# Patient Record
Sex: Female | Born: 1935
Health system: Southern US, Community
[De-identification: ages and names within clinical notes are randomized; demographics above are authoritative.]

## PROBLEM LIST (undated history)

## (undated) DIAGNOSIS — R001 Bradycardia, unspecified: Secondary | ICD-10-CM

## (undated) DIAGNOSIS — I480 Paroxysmal atrial fibrillation: Secondary | ICD-10-CM

## (undated) DIAGNOSIS — I10 Essential (primary) hypertension: Secondary | ICD-10-CM

## (undated) DIAGNOSIS — M341 CR(E)ST syndrome: Secondary | ICD-10-CM

## (undated) DIAGNOSIS — H269 Unspecified cataract: Secondary | ICD-10-CM

## (undated) DIAGNOSIS — R110 Nausea: Secondary | ICD-10-CM

## (undated) DIAGNOSIS — I272 Pulmonary hypertension, unspecified: Secondary | ICD-10-CM

## (undated) HISTORY — DX: Bradycardia, unspecified: R00.1

## (undated) HISTORY — PX: EYE SURGERY: SHX253

## (undated) HISTORY — DX: Unspecified cataract: H26.9

## (undated) HISTORY — PX: ABDOMINAL HYSTERECTOMY: SHX81

## (undated) HISTORY — DX: Paroxysmal atrial fibrillation: I48.0

---

## 2002-02-12 ENCOUNTER — Ambulatory Visit (HOSPITAL_COMMUNITY): Admission: RE | Admit: 2002-02-12 | Discharge: 2002-02-12 | Payer: Self-pay | Admitting: Unknown Physician Specialty

## 2002-07-15 ENCOUNTER — Ambulatory Visit (HOSPITAL_COMMUNITY): Admission: RE | Admit: 2002-07-15 | Discharge: 2002-07-15 | Payer: Self-pay | Admitting: *Deleted

## 2007-07-26 ENCOUNTER — Ambulatory Visit: Payer: Self-pay | Admitting: Family Medicine

## 2007-07-26 ENCOUNTER — Inpatient Hospital Stay (HOSPITAL_COMMUNITY): Admission: AD | Admit: 2007-07-26 | Discharge: 2007-07-29 | Payer: Self-pay | Admitting: Family Medicine

## 2011-02-26 NOTE — Discharge Summary (Signed)
Crystal, Espinoza NO.:  [id number]   MEDICAL RECORD NO.:  [id number]          PATIENT TYPE:  INP   LOCATION:  5710                         FACILITY:  [facility]   PHYSICIAN:  Eustaquio Boyden, MD   DATE OF BIRTH:  [birth year]   DATE OF ADMISSION:  07/26/2007  DATE OF DISCHARGE:  07/29/2007                               DISCHARGE SUMMARY   ATTENDING PHYSICIAN:  Leighton Roach McDiarmid, M.D.   DISCHARGE DIAGNOSES:  1. Pneumonia, right lower lobe.  2. Hypothyroidism.  3. Dyslipidemia.  4. Weakness.   CONSULTATIONS:  None.   PROCEDURE:  None.   ADMISSION LABORATORY DATA:  Sodium 130, potassium 3.4, chloride 101,  bicarb 22, BUN 6, creatinine 0.65, glucose 150, calcium 8.5.  White  blood cell 15.3 with 94% neutrophils.  Hemoglobin 12.8, hematocrit 37.6,  platelets 139.  BNP 172.   DISCHARGE LABORATORY DATA:  Sodium 136, potassium 4.5, chloride 105,  bicarb 23, BUN 5, creatinine 0.76, glucose 126, calcium 8.8.  White  blood cell 11.5, hemoglobin 10.6, hematocrit 31.4, platelets 135.  TSH  1.591.  Blood cultures:  No growth to date times two.   BRIEF HISTORY AND PHYSICAL:  For full summary, please see dictated H&P.  In brief, this is a 77 year old female with a history of hypothyroidism,  dyslipidemia who presents with a one-week history of cough, weakness and  fever and found to have, on outside read of chest x-ray, a right lower  and middle lobe pneumonia.   1. Right lower lobe pneumonia.  This was found on outside read of      chest x-ray done at [facility].  The patient admitted with      a white blood cell count of 15.3 and subjective fevers at home but      afebrile during hospitalization.  The patient initially required      oxygen supplementation to maintain good O2 saturations at 2 liters      nasal cannula.  This was slowly weaned off and the patient      tolerated well, maintaining 94- to 96% on room air upon discharge.      The patient treated  for pneumonia with azithromycin and Rocephin      for 4 days and discharged home to complete a five-day course of      azithromycin p.o.  2. Hypothyroidism.  The patient's TSH was measured during      hospitalization and was found to be 1.591.  The patient was      continued on home dose of Synthroid 88 mcg q.d.  3. Dyslipidemia.  The patient presented taking at home a dose of an      unknown amount of Lipitor but stated that she broke each pill into      4 pieces and took 1/4 of a pill every day.  The patient was      maintained on Zocor during hospitalization and discharged home on      her previous Lipitor regimen.  4. Weakness.  The patient presented very weak.  Physical therapy was  consulted and worked with the patient to regain strength.  On the      day of discharge, the patient noted that she had much improved      strength and was able to walk around.  Physical therapy believed      that the patient would do well at home without any further      assistance.  The patient was also treated with IV fluids to ensure      that she maintained adequate hydration.  This was slowly      transitioned to p.o. diet which she tolerated well.   DISCHARGE MEDICATIONS:  1. Synthroid 88 mcg one p.o. q.d.  2. Lipitor to continue home dose.  3. Aspirin 81 mg p.o. q.d.  4. Azithromycin 500 mg one p.o. q.d. for three more days.  5. Dextromethorphan for cough 60 mg twice daily.  6. Zolpidem 5 mg once nightly as needed for insomnia.  The patient is      given a total of 7 pills to last her until she feels better.   FOLLOWUP:  The patient is instructed to call Dr. Renne Crigler, her primary care  physician, for an appointment for follow up within the next several  weeks.  If the patient is unable to make an appointment, she should  follow up with [facility] with Dr. Milus Glazier.   ISSUES FOR FOLLOW UP:  1. Blood cultures times two.  2. Resolution of pneumonia without any more fevers or  weakness.  The      patient told that the cough would probably stay with her for a      couple of more weeks before she noted complete resolution of      symptoms.      Eustaquio Boyden, MD  Electronically Signed     JG/MEDQ  D:  07/29/2007  T:  07/30/2007  Job:  [id number]   cc:   Soyla Murphy. Renne Crigler, M.D.

## 2011-02-26 NOTE — H&P (Signed)
Crystal, Espinoza NO.:  [id number]   MEDICAL RECORD NO.:  [id number]          PATIENT TYPE:  INP   LOCATION:  5710                         FACILITY:  [facility]   PHYSICIAN:  Crystal Espinoza, M.D.DATE OF BIRTH:  [birth year]   DATE OF ADMISSION:  07/26/2007  DATE OF DISCHARGE:                              HISTORY & PHYSICAL   CHIEF COMPLAINT:  One-week history of cold and sore throat.   PRIMARY CARE Crystal Espinoza:  Dr. Renne Crigler at [facility] on [address]   HISTORY OF PRESENT ILLNESS:  This is a pleasant 77 year old female with  history of hypothyroidism who presents from [facility] with  history of one week feeling sick.  Patient states this started about a  week ago when she started feeling sick with a sore throat and this  progressed to a cough that was productive of yellow and brown sputum.  Patient went on Wednesday to [facility] to be seen and was prescribed  Singulair along with a nasal spray, ProAir, Nasacort and Veramyst.  Patient says this did not help.  Throughout the next couple of days,  patient's status worsened.  She had decreased sleeping, decreased intake  by p.o. and nausea, but no vomiting.  Patient states she has an  appetite, but just has been eating less in the last several days.  Patient also has had subjective fevers in the last couple days,  specifically this a.m.  Patient's feeling of general malaise progressed  until today when she and her husband went to [facility] to be  evaluated again.  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  She has had people who have been coughing  and she thinks she might have caught something from them.  At [facility]  today, she had a white blood cell of 13, a chest x-ray was read as a  right middle lobe infiltrate, strep test that was negative and  neutrophils 95%, and a glucose of 151 and a urinalysis which was  negative.   REVIEW OF SYSTEMS:  No  dysuria, no chest pain, abdominal pain, no  diarrhea or constipation, no blood in stool or urine, no changes in  bowel movements, +myalgia, but no vomiting.  Please see HPI, otherwise  negative.   PAST MEDICAL HISTORY:  1. Hypothyroidism.  2. History of skin cancer removed on face and hands.  3. Breast lumpectomies.  4. Two back surgeries.  5. One knee surgery, remote.   FAMILY HISTORY:  Diabetes in two brothers, skin cancer in one brother  and a brother who died of a stroke at the age of 103.   SOCIAL HISTORY:  Lives with husband, has two sons and three  grandchildren, one 61, one 33 and one 79.  Denies tobacco, says she  socially drinks alcohol and denies recreational drugs.  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   MEDICATIONS:  1. Levothyroxine 88 mcg q.daily.  2. Lipitor, unknown amount, but she states she cuts in four pieces.  3. Aspirin 81 mg one p.o. q.daily.  4. Glucosamine.   ALLERGIES:  No known drug allergies.   PHYSICAL EXAMINATION:  VITALS AT [place]:  Temperature 99.5, heart rate  106, respiratory rate 24, blood pressure 142/74, room air sating 94%,  dropped to 90 so was placed on two liters.  VITALS AT [place]:  Temperature 99.8, heart rate 91, respiratory rate  20, blood pressure 125/70, O2 sats 97% on two liters nasal cannula.  GENERAL:  Well-developed well-nourished female lying in bed in some  apparent discomfort.  HEENT:  Normocephalic, atraumatic.  Pupils equally round and reactive to  light.  Extraocular movements intact.  Dry mucous membranes.  Pale  conjunctivae.  No pharyngeal erythema or edema noted.  No scleral  icterus noted.  NECK:  Supple.  No lymphadenopathy.  CVS:  Normal S1, S2.  No murmurs, rubs or gallops.  PULMONARY:  Crackles and rales bibasilarly, right greater than left  lower lobes, but normal work of breathing.  No wheezing.  ABDOMEN:  Soft, nontender, nondistended.  Normoactive bowel sounds.  No  guarding,  rebound.  No hepatosplenomegaly.  EXTREMITIES:  No clubbing, cyanosis or edema.  2+ peripheral pulses.  Minimally delayed cap refill.  SKIN:  No rash or jaundice noted.  NEUROLOGICAL:  Cranial nerves II-XII grossly intact.  Sensation intact.  Motor intact.   LABS AND IMAGING:  At [place], white blood cells 13, neutrophils 94% so  left shift.  Other labs are pending.   Chest x-ray at [facility] read as right middle lobe infiltrate.   ASSESSMENT/PLAN:  This is a 77 year old female with history of  hypothyroidism who presents with a one-week history of weakness, sore  throat, productive cough and subjective fever.   1. Cough.  Likely pneumonia versus viral upper respiratory tract      infection; read on outside chest x-ray as a right middle lobe      infiltrate so we have started azithromycin and Rocephin.  We also      will check a strep pneumo and legionella urine antigen.  Continue      IV fluids and monitor with vitals for fever.  Recheck chest x-ray      if not clinically improving.  Blood cultures x2 pending.  2. Fever.  Subjective only.  Max measured is 99.8.  Continue to      monitor with vital signs.  May be due to upper respiratory tract      infection versus pneumonia.  3. Weakness.  Likely multifactorial, possible picture of dehydration.      Replete IV fluids and start on clear liquid diet as tolerated.  4. Fluids, electrolytes, nutrition/gastrointestinal.  See if patient      tolerates p.o. with clears and advance diet as tolerated.  IV fluid      bolus x1 in EMS.  Will do another IV fluid bolus of one liter and      then continue with maintenance IV fluids and D5 half-normal saline      plus 20 potassium at 100 ml per hour.  5. Hypothyroidism.  Continue Synthroid.  Will check TSH.  6. Prophylaxis.  Early ambulation for deep vein thrombosis      prophylaxis.  7. Disposition.  Await clinical improvement, await cultures and await      urine antigen tests.  Will change to p.o.  antibiotics once improved      and as tolerated.      Eustaquio Boyden, MD  Electronically Signed      Crystal Espinoza, M.D.  Electronically Signed    JG/MEDQ  D:  07/26/2007  T:  07/26/2007  Job:  [id number]

## 2011-03-01 NOTE — Op Note (Signed)
   NAME:  Crystal Espinoza, Crystal Espinoza                       ACCOUNT NO.:  [id number]   MEDICAL RECORD NO.:  [id number]                   PATIENT TYPE:  AMB   LOCATION:  ENDO                                 FACILITY:  [facility]   PHYSICIAN:  Georgiana Spinner, M.D.                 DATE OF BIRTH:  18-Aug-1929   DATE OF PROCEDURE:  DATE OF DISCHARGE:                                 OPERATIVE REPORT   PROCEDURE:  Colonoscopy.   INDICATIONS:  Rectal bleeding.   ANESTHESIA:  Demerol 60, Versed 6 mg.   DESCRIPTION OF PROCEDURE:  With the patient mildly sedated in the left  lateral decubitus position, the Olympus videoscope colonoscope was inserted  in the rectum and passed under direct vision through a tortuous sigmoid  colon to the cecum, identified by ileocecal valve and appendiceal orifice,  both of which were photographed.  From this point, the colonoscope was  slowly withdrawn, taking circumferential views of the entire colonic mucosa,  stopping only then in the rectum, which appeared normal on direct and showed  hemorrhoids on the retroflexed view.  The endoscope was straightened and  withdrawn.  The patient's vital signs and pulse oximetry remained stable.  The patient tolerated the procedure well without apparent complications.   FINDINGS:  Internal hemorrhoids, diverticulosis of sigmoid colon, and  tortuosity of the sigmoid colon.  Otherwise, unremarkable exam.   PLAN:  Have patient follow up with me as needed or in 5 years.                                               Georgiana Spinner, M.D.    GMO/MEDQ  D:  07/15/2002  T:  07/15/2002  Job:  [id number]

## 2011-07-25 LAB — CBC
MCHC: 34.1
MCHC: 34.1
MCV: 95.3
MCV: 95.3
MCV: 95.5
Platelets: 131 — ABNORMAL LOW
Platelets: 135 — ABNORMAL LOW
Platelets: 139 — ABNORMAL LOW
RBC: 3.37 — ABNORMAL LOW
RDW: 14.5 — ABNORMAL HIGH
WBC: 11.5 — ABNORMAL HIGH
WBC: 15.3 — ABNORMAL HIGH

## 2011-07-25 LAB — TSH: TSH: 1.591

## 2011-07-25 LAB — CULTURE, BLOOD (ROUTINE X 2)

## 2011-07-25 LAB — DIFFERENTIAL
Basophils Absolute: 0
Basophils Relative: 0
Eosinophils Absolute: 0
Neutro Abs: 14.4 — ABNORMAL HIGH
Neutrophils Relative %: 94 — ABNORMAL HIGH

## 2011-07-25 LAB — BASIC METABOLIC PANEL
BUN: 5 — ABNORMAL LOW
BUN: 6
BUN: 6
CO2: 22
CO2: 26
Calcium: 8.5
Chloride: 101
Chloride: 101
Chloride: 105
Creatinine, Ser: 0.65
Creatinine, Ser: 0.76
Creatinine, Ser: 0.78
Glucose, Bld: 113 — ABNORMAL HIGH

## 2011-07-25 LAB — B-NATRIURETIC PEPTIDE (CONVERTED LAB): Pro B Natriuretic peptide (BNP): 172 — ABNORMAL HIGH

## 2011-09-27 ENCOUNTER — Other Ambulatory Visit (HOSPITAL_COMMUNITY): Payer: Self-pay | Admitting: Rheumatology

## 2011-09-27 DIAGNOSIS — R0602 Shortness of breath: Secondary | ICD-10-CM

## 2011-10-09 ENCOUNTER — Ambulatory Visit (HOSPITAL_COMMUNITY): Payer: Medicare Other

## 2011-10-09 ENCOUNTER — Ambulatory Visit (HOSPITAL_COMMUNITY)
Admission: RE | Admit: 2011-10-09 | Discharge: 2011-10-09 | Disposition: A | Discharge status: Home / Self Care | Payer: Medicare Other | Source: Ambulatory Visit | Attending: Rheumatology | Admitting: Rheumatology

## 2011-10-09 DIAGNOSIS — R0989 Other specified symptoms and signs involving the circulatory and respiratory systems: Secondary | ICD-10-CM | POA: Insufficient documentation

## 2011-10-09 DIAGNOSIS — R0602 Shortness of breath: Secondary | ICD-10-CM | POA: Insufficient documentation

## 2011-10-09 DIAGNOSIS — R0609 Other forms of dyspnea: Secondary | ICD-10-CM | POA: Insufficient documentation

## 2011-10-11 ENCOUNTER — Other Ambulatory Visit (HOSPITAL_COMMUNITY): Payer: Self-pay | Admitting: Radiology

## 2011-10-11 DIAGNOSIS — R0600 Dyspnea, unspecified: Secondary | ICD-10-CM

## 2011-10-14 ENCOUNTER — Ambulatory Visit (HOSPITAL_COMMUNITY): Payer: Medicare Other | Attending: Cardiology | Admitting: Radiology

## 2011-10-14 DIAGNOSIS — R0609 Other forms of dyspnea: Secondary | ICD-10-CM | POA: Insufficient documentation

## 2011-10-14 DIAGNOSIS — I10 Essential (primary) hypertension: Secondary | ICD-10-CM | POA: Insufficient documentation

## 2011-10-14 DIAGNOSIS — R0989 Other specified symptoms and signs involving the circulatory and respiratory systems: Secondary | ICD-10-CM | POA: Insufficient documentation

## 2011-10-14 DIAGNOSIS — R0600 Dyspnea, unspecified: Secondary | ICD-10-CM

## 2011-10-16 ENCOUNTER — Encounter (HOSPITAL_COMMUNITY): Payer: Self-pay | Admitting: Emergency Medicine

## 2012-10-29 ENCOUNTER — Ambulatory Visit (INDEPENDENT_AMBULATORY_CARE_PROVIDER_SITE_OTHER): Payer: Medicare Other | Admitting: Family Medicine

## 2012-10-29 ENCOUNTER — Encounter: Payer: Self-pay | Admitting: Family Medicine

## 2012-10-29 ENCOUNTER — Ambulatory Visit: Payer: Medicare Other

## 2012-10-29 VITALS — BP 162/69 | HR 81 | Temp 98.4°F | Resp 16 | Ht 64.5 in | Wt 147.6 lb

## 2012-10-29 DIAGNOSIS — R059 Cough, unspecified: Secondary | ICD-10-CM

## 2012-10-29 DIAGNOSIS — J069 Acute upper respiratory infection, unspecified: Secondary | ICD-10-CM

## 2012-10-29 DIAGNOSIS — R05 Cough: Secondary | ICD-10-CM

## 2012-10-29 LAB — POCT CBC
Granulocyte percent: 78.4 % (ref 37–80)
HCT, POC: 40.7 % (ref 37.7–47.9)
Hemoglobin: 12.7 g/dL (ref 12.2–16.2)
Lymph, poc: 1.5 (ref 0.6–3.4)
MCH, POC: 31 pg (ref 27–31.2)
MCHC: 31.2 g/dL — AB (ref 31.8–35.4)
MCV: 99.3 fL — AB (ref 80–97)
MID (cbc): 0.3 (ref 0–0.9)
MPV: 11.1 fL (ref 0–99.8)
POC Granulocyte: 6.7 (ref 2–6.9)
POC LYMPH PERCENT: 17.7 % (ref 10–50)
POC MID %: 3.9 % (ref 0–12)
Platelet Count, POC: 151 10*3/uL (ref 142–424)
RBC: 4.1 M/uL (ref 4.04–5.48)
RDW, POC: 15.7 %
WBC: 8.5 10*3/uL (ref 4.6–10.2)

## 2012-10-29 MED ORDER — HYDROCODONE-HOMATROPINE 5-1.5 MG/5ML PO SYRP: 5.0000 mL | ORAL_SOLUTION | Freq: Every evening | ORAL | Status: DC | PRN

## 2012-10-29 MED ORDER — BENZONATATE 100 MG PO CAPS: 100.0000 mg | ORAL_CAPSULE | Freq: Three times a day (TID) | ORAL | Status: DC | PRN

## 2012-10-29 MED ORDER — LEVOFLOXACIN 500 MG PO TABS: 500.0000 mg | ORAL_TABLET | Freq: Every day | ORAL | Status: DC

## 2012-10-29 NOTE — Progress Notes (Signed)
[facility]:  Office Visit  Chief Complaint:  Chief Complaint  Patient presents with  . Cough    x 1 week dry   . Sinus Problem    congestion    HPI: Crystal Espinoza is a 79 y.o. female who complains of cough and congestion, productive yellow sputum x 1 week. + sinus congestion. Tried rx cough syrup she had  with some relief. Had some chills this AM. No ear pain. Non smoker. Has felt worse in the last 2 days, more coughing, could not sleep last night due to cough. She denies msk pains/aches. Has had flu vaccine. Denies fever. No sick contacts.    Past Medical History  Diagnosis Date  . Cataract    Past Surgical History  Procedure Date  . Eye surgery   . Abdominal hysterectomy    History   Social History  . Marital Status: Married    Spouse Name: N/A    Number of Children: N/A  . Years of Education: N/A   Social History Main Topics  . Smoking status: Never Smoker   . Smokeless tobacco: None  . Alcohol Use: No  . Drug Use: No  . Sexually Active: No   Other Topics Concern  . None   Social History Narrative  . None   No family history on file. Allergies no known allergies Prior to Admission medications   Not on File     ROS: The patient denies night sweats, unintentional weight loss, chest pain, palpitations, wheezing, dyspnea on exertion, nausea, vomiting, abdominal pain, dysuria, hematuria, melena, numbness, weakness, or tingling.  All other systems have been reviewed and were otherwise negative with the exception of those mentioned in the HPI and as above.    PHYSICAL EXAM: Filed Vitals:   10/29/12 1656  BP: 162/69  Pulse: 81  Temp: 98.4 F (36.9 C)  Resp: 16   Filed Vitals:   10/29/12 1656  Height: 5' 4.5" (1.638 m)  Weight: 147 lb 9.6 oz (66.951 kg)   Body mass index is 24.94 kg/(m^2).  General: Alert, no acute distress, thin white female HEENT:  Normocephalic, atraumatic, oropharynx patent. + sinus tenderness bl  maxillary sinus, Tm nl . No exudates Cardiovascular:  Regular rate and rhythm, no rubs murmurs or gallops.  No Carotid bruits, radial pulse intact. No pedal edema.  Respiratory: Clear to auscultation bilaterally.  No wheezes, rales, or rhonchi.  No cyanosis, no use of accessory musculature GI: No organomegaly, abdomen is soft and non-tender, positive bowel sounds.  No masses. Skin: No rashes. Neurologic: Facial musculature symmetric. Psychiatric: Patient is appropriate throughout our interaction. Lymphatic: No cervical lymphadenopathy Musculoskeletal: Gait intact.   LABS: Results for orders placed in visit on 10/29/12  POCT CBC      Component Value Range   WBC 8.5  4.6 - 10.2 K/uL   Lymph, poc 1.5  0.6 - 3.4   POC LYMPH PERCENT 17.7  10 - 50 %L   MID (cbc) 0.3  0 - 0.9   POC MID % 3.9  0 - 12 %M   POC Granulocyte 6.7  2 - 6.9   Granulocyte percent 78.4  37 - 80 %G   RBC 4.10  4.04 - 5.48 M/uL   Hemoglobin 12.7  12.2 - 16.2 g/dL   HCT, POC 45.4  09.8 - 47.9 %   MCV 99.3 (*) 80 - 97 fL   MCH, POC 31.0  27 - 31.2 pg   MCHC 31.2 (*)  31.8 - 35.4 g/dL   RDW, POC 16.1     Platelet Count, POC 151  142 - 424 K/uL   MPV 11.1  0 - 99.8 fL     EKG/XRAY:   Primary read interpreted by Dr. Conley Rolls at [facility]. ? RLL infiltrate vs increase vascular changes Prominent aortic notch  ASSESSMENT/PLAN: Encounter Diagnoses  Name Primary?  . Cough Yes  . URI, acute     Will presumptively treat for acute sinusitis with bronchitic changes on xray vs ? RLL infiltrate Rx Levaquin  Rx Hydromet, Tessalon F/u prn    [name], DO 10/29/2012 5:43 PM

## 2012-10-30 ENCOUNTER — Telehealth: Payer: Self-pay | Admitting: Family Medicine

## 2012-10-30 DIAGNOSIS — J019 Acute sinusitis, unspecified: Secondary | ICD-10-CM

## 2012-10-30 DIAGNOSIS — R110 Nausea: Secondary | ICD-10-CM

## 2012-10-30 MED ORDER — ONDANSETRON 4 MG PO TBDP: 4.0000 mg | ORAL_TABLET | Freq: Three times a day (TID) | ORAL | Status: DC | PRN

## 2012-10-30 MED ORDER — AMOXICILLIN-POT CLAVULANATE 875-125 MG PO TABS: 1.0000 | ORAL_TABLET | Freq: Two times a day (BID) | ORAL | Status: DC

## 2012-10-30 NOTE — Telephone Encounter (Signed)
Patient feeling better but abx makes her nauseated. Will change Levaquin to amoxacillin 500 mg BID for acute sinusitis/bronchitis.Will also give zofran ODT for nausea

## 2014-02-22 ENCOUNTER — Ambulatory Visit (INDEPENDENT_AMBULATORY_CARE_PROVIDER_SITE_OTHER): Payer: Medicare Other | Admitting: Cardiovascular Disease

## 2014-02-22 ENCOUNTER — Encounter: Payer: Self-pay | Admitting: Cardiovascular Disease

## 2014-02-22 VITALS — BP 160/86 | Resp 16 | Ht 64.5 in | Wt 142.1 lb

## 2014-02-22 DIAGNOSIS — I4510 Unspecified right bundle-branch block: Secondary | ICD-10-CM

## 2014-02-22 DIAGNOSIS — M349 Systemic sclerosis, unspecified: Secondary | ICD-10-CM

## 2014-02-22 DIAGNOSIS — I499 Cardiac arrhythmia, unspecified: Secondary | ICD-10-CM

## 2014-02-22 DIAGNOSIS — I491 Atrial premature depolarization: Secondary | ICD-10-CM

## 2014-02-22 DIAGNOSIS — M341 CR(E)ST syndrome: Secondary | ICD-10-CM

## 2014-02-22 NOTE — Patient Instructions (Addendum)
Please take a look at your medicine bottles at home to check the strengths of your medicines and give me a call back. The number to reach me is [phone] and ask for Chelley.   Dr Royann Shiversroitoru recommends you to follow-up with him on an as needed basis.

## 2014-02-27 ENCOUNTER — Encounter: Payer: Self-pay | Admitting: Cardiovascular Disease

## 2014-02-27 DIAGNOSIS — I4510 Unspecified right bundle-branch block: Secondary | ICD-10-CM | POA: Insufficient documentation

## 2014-02-27 DIAGNOSIS — M341 CR(E)ST syndrome: Secondary | ICD-10-CM | POA: Insufficient documentation

## 2014-02-27 DIAGNOSIS — I491 Atrial premature depolarization: Secondary | ICD-10-CM | POA: Insufficient documentation

## 2014-02-27 NOTE — Progress Notes (Signed)
Patient ID: Crystal Espinoza, female   DOB: [birth year], 80 y.o.   MRN: [id number]      Reason for office visit Abnormal electrocardiogram  [name] is referred in consultation for possible atrial fibrillation on her electrocardiogram. The tracing shows an irregular rhythm with distant P waves that have varying morphologies. I think it most likely represents sinus rhythm with frequent premature atrial contractions. She also has a right bundle branch block which is a chronic abnormality. Electrocardiogram performed in our office today shows more clear-cut sinus rhythm with a single premature atrial beat and the same right bundle branch block.  The patient has no cardiac complaints and she specifically denies issues with chest pain, dyspnea at rest or with exertion, palpitations, syncope, presyncope, focal neurological events such as stroke or TIA.  She has a diagnosis of CREST syndrome, primarily manifesting as sclerodactyly. She does not have a lot of problems with swallowing and has fairly infrequent and Raynaud's phenomenon. As far as I know she does not have pulmonary hypertension or significant renal problems. She's had numerous surgeries, none of them recent. She has a diagnosis of hypertension and her blood pressure was a little high when she saw Dr. Shelia Media. It is again a little high today, but she states that at home her blood pressures normal and she always has elevated blood pressure when she comes to see the doctor. Her family history is remarkable for longevity and the absence of premature cardiac or vascular disease.   No Known Allergies  Current Outpatient Prescriptions  Medication Sig Dispense Refill  . acetaminophen (TYLENOL) 325 MG tablet Take 650 mg by mouth daily.      Marland Kitchen aspirin 325 MG tablet Take 325 mg by mouth daily.      . calcium citrate-vitamin D (CITRACAL+D) 315-200 MG-UNIT per tablet Take 1 tablet by mouth 2 (two) times daily.      Marland Kitchen HYDROcodone-acetaminophen  (NORCO/VICODIN) 5-325 MG per tablet Take 1 tablet by mouth as needed for moderate pain.      . Levothyroxine Sodium (LEVOXYL PO) Take 1 tablet by mouth daily.      . Multiple Vitamin (MULTIVITAMIN) tablet Take 1 tablet by mouth daily.      Marland Kitchen SIMVASTATIN PO Take 1 tablet by mouth daily.       No current facility-administered medications for this visit.    Past Medical History  Diagnosis Date  . Cataract     Past Surgical History  Procedure Laterality Date  . Eye surgery    . Abdominal hysterectomy      Family History  Problem Relation Age of Onset  . Heart disease Mother   . Stroke Father   . Stroke Sister   . Stroke Sister   . Cancer Sister   . Diabetes Sister     History   Social History  . Marital Status: Married    Spouse Name: N/A    Number of Children: N/A  . Years of Education: N/A   Occupational History  . Not on file.   Social History Main Topics  . Smoking status: Never Smoker   . Smokeless tobacco: Not on file  . Alcohol Use: No  . Drug Use: No  . Sexual Activity: No   Other Topics Concern  . Not on file   Social History Narrative  . No narrative on file    Review of systems: The patient specifically denies any chest pain at rest or with exertion, dyspnea at rest or with  exertion, orthopnea, paroxysmal nocturnal dyspnea, syncope, palpitations, focal neurological deficits, intermittent claudication, lower extremity edema, unexplained weight gain, cough, hemoptysis or wheezing.  The patient also denies abdominal pain, nausea, vomiting, dysphagia, diarrhea, constipation, polyuria, polydipsia, dysuria, hematuria, frequency, urgency, abnormal bleeding or bruising, fever, chills, unexpected weight changes, mood swings, change in skin or hair texture, change in voice quality, auditory or visual problems, allergic reactions or rashes, new musculoskeletal complaints other than usual "aches and pains".   PHYSICAL EXAM BP 160/86  Resp 16  Ht 5' 4.5"  (1.638 m)  Wt 142 lb 1.6 oz (64.456 kg)  BMI 24.02 kg/m2  General: Alert, oriented x3, no distress Head: no evidence of trauma, PERRL, EOMI, no exophtalmos or lid lag, no myxedema, no xanthelasma; normal ears, nose and oropharynx Neck: normal jugular venous pulsations and no hepatojugular reflux; brisk carotid pulses without delay and no carotid bruits Chest: clear to auscultation, no signs of consolidation by percussion or palpation, normal fremitus, symmetrical and full respiratory excursions Cardiovascular: normal position and quality of the apical impulse, regular rhythm, normal first and widely split second heart sounds, no murmurs, rubs or gallops Abdomen: no tenderness or distention, no masses by palpation, no abnormal pulsatility or arterial bruits, normal bowel sounds, no hepatosplenomegaly Extremities: Sclerodactyly; no clubbing, cyanosis or edema; 2+ radial, ulnar and brachial pulses bilaterally; 2+ right femoral, posterior tibial and dorsalis pedis pulses; 2+ left femoral, posterior tibial and dorsalis pedis pulses; no subclavian or femoral bruits Neurological: grossly nonfocal   EKG: Sinus rhythm with PACs, right bundle branch block  Lipid Panel  No results found for this basename: chol, trig, hdl, cholhdl, vldl, ldlcalc    BMET    Component Value Date/Time   NA 136 07/28/2007 0430   K 4.5 07/28/2007 0430   CL 105 07/28/2007 0430   CO2 23 07/28/2007 0430   GLUCOSE 126* 07/28/2007 0430   BUN 5* 07/28/2007 0430   CREATININE 0.76 07/28/2007 0430   CALCIUM 8.8 07/28/2007 0430   GFRNONAA >60 07/28/2007 0430   GFRAA  Value: >60        The eGFR has been calculated using the MDRD equation. This calculation has not been validated in all clinical 07/28/2007 0430     ASSESSMENT AND PLAN  I don't think [name] has evidence of atrial fibrillation. She does have fairly frequent premature atrial contractions, which are asymptomatic.   She has a chronic right bundle branch  block, but no clinical evidence to suggest cor pulmonale. Conceivably, due to her diagnosis of scleroderma, she could have pulmonary hypertension but this is not evident by history or physical exam. An echocardiogram performed in 2012 did show very mild elevation in pulmonary artery pressures with a peak systolic pressure around 37 mm Hg. It is just as likely that her right bundle branch block is age-related conduction disease, rather than a sign of pulmonary hypertension.  Her blood pressure is episodically elevated. She had normal left ventricular size and wall thickness on echo, suggesting that indeed her high blood pressure situational rather than permanent. Have asked her to continue monitoring her blood pressure at home and let's me or Dr.Pharr know if she has systolic blood pressure over 824 or diastolic blood pressure over 90.   At this point I don't think she requires additional cardiology workup, but will be glad to see her back if her primary care physician thinks it necessary.   Patient Instructions  Please take a look at your medicine bottles at home to check  the strengths of your medicines and give me a call back. The number to reach me is [phone] and ask for Chelley.   Dr Sallyanne Kuster recommends you to follow-up with him on an as needed basis.    Meds ordered this encounter  Medications  . acetaminophen (TYLENOL) 325 MG tablet    Sig: Take 650 mg by mouth daily.  . Multiple Vitamin (MULTIVITAMIN) tablet    Sig: Take 1 tablet by mouth daily.  . calcium citrate-vitamin D (CITRACAL+D) 315-200 MG-UNIT per tablet    Sig: Take 1 tablet by mouth 2 (two) times daily.  Marland Kitchen SIMVASTATIN PO    Sig: Take 1 tablet by mouth daily.  . Levothyroxine Sodium (LEVOXYL PO)    Sig: Take 1 tablet by mouth daily.  Marland Kitchen HYDROcodone-acetaminophen (NORCO/VICODIN) 5-325 MG per tablet    Sig: Take 1 tablet by mouth as needed for moderate pain.  Marland Kitchen aspirin 325 MG tablet    Sig: Take 325 mg by mouth daily.      Lukus Binion  Sanda Klein, MD, [facility] [phone] office [phone] pager

## 2016-06-18 ENCOUNTER — Other Ambulatory Visit: Payer: Self-pay

## 2017-01-30 ENCOUNTER — Telehealth: Payer: Self-pay | Admitting: Cardiovascular Disease

## 2017-01-30 NOTE — Telephone Encounter (Signed)
New Message    Pt in atrial flutter

## 2017-01-30 NOTE — Telephone Encounter (Signed)
Received records from [facility] for appointment on 01/31/17 with Dr Duke Salvia.  Records put with Dr Sunday Spillers schedule for 01/31/17. lp

## 2017-01-30 NOTE — Telephone Encounter (Signed)
Incoming call from scheduling with Dennie Bible from [facility] on the line. Per Geoffery Spruce has called to state that the patient was in atrial flutter and would need an appointment. She has been scheduled with the DOD for Friday, Dr. Duke Salvia. I did ask Carney Bern to ask Dennie Bible if she felt like the patient could wait until tomorrow to be seen and she told Carney Bern that she could wait.

## 2017-01-31 ENCOUNTER — Encounter: Payer: Self-pay | Admitting: Cardiovascular Disease

## 2017-01-31 ENCOUNTER — Ambulatory Visit (INDEPENDENT_AMBULATORY_CARE_PROVIDER_SITE_OTHER): Payer: Medicare Other | Admitting: Cardiovascular Disease

## 2017-01-31 VITALS — BP 144/66 | HR 60 | Ht 64.0 in | Wt 140.0 lb

## 2017-01-31 DIAGNOSIS — I4892 Unspecified atrial flutter: Secondary | ICD-10-CM

## 2017-01-31 DIAGNOSIS — I480 Paroxysmal atrial fibrillation: Secondary | ICD-10-CM | POA: Diagnosis not present

## 2017-01-31 MED ORDER — RIVAROXABAN 20 MG PO TABS: 20.0000 mg | ORAL_TABLET | Freq: Every day | ORAL | 5 refills | Status: DC

## 2017-01-31 NOTE — Patient Instructions (Addendum)
Medication Instructions:  STOP THE ELIQUIS  START XARELTO 20 MG DAILY WITH FOOD   Labwork: NONE  Testing/Procedures: Your physician has requested that you have an echocardiogram. Echocardiography is a painless test that uses sound waves to create images of your heart. It provides your doctor with information about the size and shape of your heart and how well your heart's chambers and valves are working. This procedure takes approximately one hour. There are no restrictions for this procedure. [facility] AT [address]  Follow-Up: Your physician recommends that you schedule a follow-up appointment in: 4 MONTH OV  Any Other Special Instructions Will Be Listed Below (If Applicable). WHEN YOU TAKE YOUR PRESCRIPTION TO THE PHARMACY ASK THEM IF THE PRICE OF XARELTO WILL COME DOWN THE NEXT TIME YOU FILL IT OR IF IT WILL ALWAYS BE PRICE STAY AT THE PRICE THEY QUOTE YOU    If you need a refill on your cardiac medications before your next appointment, please call your pharmacy.

## 2017-01-31 NOTE — Progress Notes (Signed)
Cardiology Office Note   Date:  02/02/2017   ID:  Crystal Espinoza, DOB [birth year], MRN [id number]  PCP:  Londell Moh, MD  Cardiologist:   Chilton Si, MD   Chief Complaint  Patient presents with  . Follow-up    having some fluttering for a 3-4 days       History of Present Illness: Crystal Espinoza is a 83 y.o. female with RBBB, scleroderma and atrial flutter who is being seen today for the evaluation of atrial flutter at the request of Merri Brunette, MD.  [name] saw Dr. Renne Crigler on 01/30/17 and was incidentally noted to be in atrial flutter. Her ventricular rate was 57 bpm. She was started on Eliquis and referred to cardiology for further evaluation.  Earlier in the week she thinks she may have felt a slight fluttering in her chest.  She was otherwise asymptomatic.  She denies fatigue, shortness of breath, chest pain, lower extremity edema, orthopnea or PND.  She is started on Eliquis but developed nausea and diarrhea.  She is also concerned about the cost of the medication.  She has continued to take it as prescribed.     Past Medical History:  Diagnosis Date  . Cataract   . Paroxysmal atrial fibrillation (HCC) 02/02/2017    Past Surgical History:  Procedure Laterality Date  . ABDOMINAL HYSTERECTOMY    . EYE SURGERY       Current Outpatient Prescriptions  Medication Sig Dispense Refill  . acetaminophen (TYLENOL) 325 MG tablet Take 650 mg by mouth daily.    . calcium citrate-vitamin D (CITRACAL+D) 315-200 MG-UNIT per tablet Take 1 tablet by mouth 2 (two) times daily.    . Glucosamine 500 MG CAPS Take 2 capsules by mouth daily.    Marland Kitchen levothyroxine (SYNTHROID) 100 MCG tablet Take 1 tablet by mouth daily.    . Multiple Vitamin (MULTIVITAMIN) tablet Take 1 tablet by mouth daily.    . simvastatin (ZOCOR) 40 MG tablet Take 1 tablet by mouth daily.    . traMADol (ULTRAM) 50 MG tablet Take 50 mg by mouth 2 (two) times daily.    . rivaroxaban (XARELTO) 20 MG  TABS tablet Take 1 tablet (20 mg total) by mouth daily with supper. 30 tablet 5   No current facility-administered medications for this visit.     Allergies:   Patient has no known allergies.    Social History:  The patient  reports that she has never smoked. She has never used smokeless tobacco. She reports that she does not drink alcohol or use drugs.   Family History:  The patient's family history includes Cancer in her sister; Diabetes in her sister; Heart disease in her mother; Stroke in her father, sister, and sister.    ROS:  Please see the history of present illness.   Otherwise, review of systems are positive for none.   All other systems are reviewed and negative.    PHYSICAL EXAM: VS:  BP (!) 144/66   Pulse 60   Ht  (1.626 m)   Wt 63.5 kg (140 lb)   BMI 24.03 kg/m  , BMI Body mass index is 24.03 kg/m. GENERAL:  Well appearing HEENT:  Pupils equal round and reactive, fundi not visualized, oral mucosa unremarkable NECK:  No jugular venous distention, waveform within normal limits, carotid upstroke brisk and symmetric, no bruits, no thyromegaly LYMPHATICS:  No cervical adenopathy LUNGS:  Clear to auscultation bilaterally HEART:  Irregularly irregular.  PMI not  displaced or sustained,S1 and S2 within normal limits, no S3, no S4, no clicks, no rubs, no murmurs ABD:  Flat, positive bowel sounds normal in frequency in pitch, no bruits, no rebound, no guarding, no midline pulsatile mass, no hepatomegaly, no splenomegaly EXT:  2 plus pulses throughout, no edema, no clubbing.  R third digit cyanosis SKIN:  No rashes no nodules NEURO:  Cranial nerves II through XII grossly intact, motor grossly intact throughout PSYCH:  Cognitively intact, oriented to person place and time    EKG:  EKG is ordered today. The ekg ordered today demonstrates atrial flutter. Ventricular rate 61 bpm. Variable AV conduction. Right bundle branch block. 01/30/17: Atrial flutter. Ventricular rate 63  bpm. Right bundle branch block.  Recent Labs: No results found for requested labs within last 8760 hours.   01/23/17: WBC 3.9, hemoglobin 13.1, hematocrit 40.3, platelets 127 Sodium 141, potassium 4.4, BUN 16, creatinine 0.8 AST 13, ALT 19 Total cholesterol 168, triglycerides 72, HDL 65, LDL 89   Lipid Panel No results found for: CHOL, TRIG, HDL, CHOLHDL, VLDL, LDLCALC, LDLDIRECT    Wt Readings from Last 3 Encounters:  01/31/17 63.5 kg (140 lb)  02/22/14 64.5 kg (142 lb 1.6 oz)  10/29/12 67 kg (147 lb 9.6 oz)      ASSESSMENT AND PLAN:  # Paroxysmal atrial fibrillation/flutter: [name] has new onset atrial fibrillation and is relatively asymptomatic.  Her last EKG in 2015 showed sinus rhythm.  It is unclear how long she has been in atrial fibrillation.  We will get an echo and thyroid function.  Stop abixaban and start rivaroxaban 20 mg daily.  Rates are well-controlled.  Stop aspirin.  # Hyperlipidemia: Continue simvastatin.  Current medicines are reviewed at length with the patient today.  The patient does not have concerns regarding medicines.  The following changes have been made:  Switch Eliquis to Xarelto  Labs/ tests ordered today include:   Orders Placed This Encounter  Procedures  . EKG 12-Lead  . ECHOCARDIOGRAM COMPLETE     Disposition:   FU with Bellamy Rubey C. Duke Salvia, MD, [facility] in 4 months.   This note was written with the assistance of speech recognition software.  Please excuse any transcriptional errors.  Signed, Therasa Lorenzi C. Duke Salvia, MD, [facility]  02/02/2017 9:01 PM    [facility]

## 2017-02-02 ENCOUNTER — Encounter: Payer: Self-pay | Admitting: Cardiovascular Disease

## 2017-02-02 DIAGNOSIS — I480 Paroxysmal atrial fibrillation: Secondary | ICD-10-CM

## 2017-02-02 HISTORY — DX: Paroxysmal atrial fibrillation: I48.0

## 2017-02-13 ENCOUNTER — Other Ambulatory Visit: Payer: Self-pay

## 2017-02-13 ENCOUNTER — Ambulatory Visit (HOSPITAL_COMMUNITY): Payer: Medicare Other | Attending: Cardiology

## 2017-02-13 DIAGNOSIS — I4892 Unspecified atrial flutter: Secondary | ICD-10-CM | POA: Diagnosis present

## 2017-02-13 DIAGNOSIS — I071 Rheumatic tricuspid insufficiency: Secondary | ICD-10-CM | POA: Insufficient documentation

## 2017-02-13 DIAGNOSIS — I371 Nonrheumatic pulmonary valve insufficiency: Secondary | ICD-10-CM | POA: Diagnosis not present

## 2017-02-13 DIAGNOSIS — I272 Pulmonary hypertension, unspecified: Secondary | ICD-10-CM | POA: Insufficient documentation

## 2017-02-13 DIAGNOSIS — I340 Nonrheumatic mitral (valve) insufficiency: Secondary | ICD-10-CM | POA: Diagnosis not present

## 2017-03-11 ENCOUNTER — Other Ambulatory Visit (HOSPITAL_COMMUNITY): Payer: Self-pay | Admitting: Respiratory Therapy

## 2017-03-11 DIAGNOSIS — M349 Systemic sclerosis, unspecified: Secondary | ICD-10-CM

## 2017-03-19 ENCOUNTER — Ambulatory Visit (HOSPITAL_COMMUNITY)
Admission: RE | Admit: 2017-03-19 | Discharge: 2017-03-19 | Disposition: A | Discharge status: Home / Self Care | Payer: Medicare Other | Source: Ambulatory Visit | Attending: Rheumatology | Admitting: Rheumatology

## 2017-03-19 DIAGNOSIS — M349 Systemic sclerosis, unspecified: Secondary | ICD-10-CM | POA: Insufficient documentation

## 2017-03-19 LAB — PULMONARY FUNCTION TEST
DL/VA % PRED: 69 %
DL/VA: 3.31 ml/min/mmHg/L
DLCO unc % pred: 39 %
DLCO unc: 9.64 ml/min/mmHg
FEF 25-75 POST: 1.21 L/s
FEF 25-75 Pre: 1.4 L/sec
FEF2575-%CHANGE-POST: -13 %
FEF2575-%PRED-POST: 113 %
FEF2575-%Pred-Pre: 130 %
FEV1-%Change-Post: -3 %
FEV1-%PRED-PRE: 78 %
FEV1-%Pred-Post: 75 %
FEV1-PRE: 1.34 L
FEV1-Post: 1.29 L
FEV1FVC-%CHANGE-POST: 3 %
FEV1FVC-%PRED-PRE: 115 %
FEV6-%Change-Post: -7 %
FEV6-%PRED-PRE: 73 %
FEV6-%Pred-Post: 68 %
FEV6-Post: 1.5 L
FEV6-Pre: 1.61 L
FEV6FVC-%Pred-Post: 106 %
FEV6FVC-%Pred-Pre: 106 %
FVC-%Change-Post: -7 %
FVC-%PRED-POST: 64 %
FVC-%PRED-PRE: 69 %
FVC-POST: 1.5 L
FVC-PRE: 1.61 L
POST FEV1/FVC RATIO: 86 %
PRE FEV1/FVC RATIO: 83 %
Post FEV6/FVC ratio: 100 %
Pre FEV6/FVC Ratio: 100 %
RV % pred: 85 %
RV: 2.16 L
TLC % pred: 73 %
TLC: 3.7 L

## 2017-03-19 MED ORDER — ALBUTEROL SULFATE (2.5 MG/3ML) 0.083% IN NEBU
2.5000 mg | INHALATION_SOLUTION | Freq: Once | RESPIRATORY_TRACT | Status: AC
  Administered 2017-03-19: 2.5 mg via RESPIRATORY_TRACT

## 2017-04-03 ENCOUNTER — Ambulatory Visit (INDEPENDENT_AMBULATORY_CARE_PROVIDER_SITE_OTHER): Payer: Medicare Other | Admitting: Cardiology

## 2017-04-03 ENCOUNTER — Encounter: Payer: Self-pay | Admitting: Cardiology

## 2017-04-03 VITALS — BP 200/62 | HR 54 | Ht 64.0 in | Wt 134.0 lb

## 2017-04-03 DIAGNOSIS — I4892 Unspecified atrial flutter: Secondary | ICD-10-CM

## 2017-04-03 DIAGNOSIS — I480 Paroxysmal atrial fibrillation: Secondary | ICD-10-CM | POA: Diagnosis not present

## 2017-04-03 DIAGNOSIS — I10 Essential (primary) hypertension: Secondary | ICD-10-CM

## 2017-04-03 MED ORDER — AMLODIPINE BESYLATE 5 MG PO TABS: 5.0000 mg | ORAL_TABLET | Freq: Every day | ORAL | 3 refills | Status: DC

## 2017-04-03 NOTE — Progress Notes (Signed)
Cardiology Office Note   Date:  04/03/2017   ID:  Crystal Espinoza, DOB 11/28/1928, MRN [id number]  PCP:  Merri BrunettePharr, Walter, MD  Cardiologist:   Chilton Siiffany Scanlon MD  Chief Complaint  Patient presents with  . Follow-up    Elevated BP.  Marland Kitchen. Shortness of Breath  . Headache    When walking  . Fatigue      History of Present Illness: Crystal PlumbLouise B Mawson is a 83 y.o. female who is seen at the request of Dr. Elnoria HowardHung for evaluation of severely elevated BP. Seen in April by Dr. Duke Salviaandolph for evaluation of atrial flutter. Rate was well controlled on no medication. BP at that time 144/66 and patient on no antihypertensives. She was placed on Xarelto. 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 She has had elevated BP noted intermittently in the past without diagnosis of HTN. Seen by Dr. Elnoria HowardHung today and BP noted to be markedly elevated with reading 230/130. Sent here for evaluation.   She is not having any chest pain, back pain, SOB, dizziness, HA or swelling. Complains of chronic nausea in am and was scheduled for EGD with Dr. Elnoria HowardHung today. Her medication lists amlodipine prn for Raynauds but she has not taken this.     Past Medical History:  Diagnosis Date  . Cataract   . Paroxysmal atrial fibrillation (HCC) 02/02/2017    Past Surgical History:  Procedure Laterality Date  . ABDOMINAL HYSTERECTOMY    . EYE SURGERY       Current Outpatient Prescriptions  Medication Sig Dispense Refill  . acetaminophen (TYLENOL) 325 MG tablet Take 650 mg by mouth daily.    . calcium citrate-vitamin D (CITRACAL+D) 315-200 MG-UNIT per tablet Take 1 tablet by mouth 2 (two) times daily.    . Glucosamine 500 MG CAPS Take 2 capsules by mouth daily.    Marland Kitchen. levothyroxine (SYNTHROID) 100 MCG tablet Take 1 tablet by mouth daily.    . Multiple Vitamin (MULTIVITAMIN) tablet Take 1 tablet by mouth daily.    . rivaroxaban (XARELTO) 20 MG TABS tablet Take 1 tablet (20 mg total) by mouth daily with  supper. 30 tablet 5  . simvastatin (ZOCOR) 40 MG tablet Take 1 tablet by mouth daily.    . traMADol (ULTRAM) 50 MG tablet Take 50 mg by mouth 2 (two) times daily.     No current facility-administered medications for this visit.     Allergies:   Patient has no known allergies.    Social History:  The patient  reports that she has never smoked. She has never used smokeless tobacco. She reports that she does not drink alcohol or use drugs.   Family History:  The patient's family history includes Cancer in her sister; Diabetes in her sister; Heart disease in her mother; Stroke in her father, sister, and sister.    ROS:  Please see the history of present illness.   Otherwise, review of systems are positive for none.   All other systems are reviewed and negative.    PHYSICAL EXAM: VS:  BP (!) 200/62 Comment: Left arm.  Pulse (!) 54   Ht 5\' 4"  (1.626 m)   Wt 134 lb (60.8 kg)   BMI 23.00 kg/m  , BMI Body mass index is 23 kg/m. GEN: Well nourished, well developed, in no acute distress  HEENT: normal  Neck: no JVD, carotid bruits, or masses Cardiac: IRRR; no murmurs, rubs, or gallops,no edema  Respiratory:  clear  to auscultation bilaterally, normal work of breathing GI: soft, nontender, nondistended, + BS MS: no deformity or atrophy  Skin: warm and dry, no rash Neuro:  Strength and sensation are intact Psych: euthymic mood, full affect   EKG:  EKG is not ordered today.   Recent Labs: No results found for requested labs within last 8760 hours.    Lipid Panel No results found for: CHOL, TRIG, HDL, CHOLHDL, VLDL, LDLCALC, LDLDIRECT    Wt Readings from Last 3 Encounters:  04/03/17 134 lb (60.8 kg)  01/31/17 140 lb (63.5 kg)  02/22/14 142 lb 1.6 oz (64.5 kg)      Other studies Reviewed: Labs reviewed from April 12,2018: CBC, CMET, TSH, UA normal. Cholesterol 168, triglycerides 72, HDL 65, LDL 89.  Echo: 02/13/17: Study Conclusions  - Left ventricle: The cavity size was  normal. Wall thickness was   increased in a pattern of mild LVH. Indeterminant diastolic   function. Systolic function was normal. The estimated ejection   fraction was in the range of 60% to 65%. Wall motion was normal;   there were no regional wall motion abnormalities. - Aortic valve: There was no stenosis. - Mitral valve: There was trivial regurgitation. - Left atrium: The atrium was moderately dilated. - Right ventricle: The cavity size was normal. Systolic function   was normal. - Right atrium: The atrium was moderately dilated. - Tricuspid valve: Peak RV-RA gradient (S): 69 mm Hg. - Pulmonary arteries: PA peak pressure: 72 mm Hg (S). - Inferior vena cava: The vessel was normal in size. The   respirophasic diameter changes were in the normal range (>= 50%),   consistent with normal central venous pressure.  Impressions:  - Normal LV size with mild LV hypertrophy. EF 60-65%. Normal RV   size and systolic function. Moderate biatrial enlargement. Severe   pulmonary hypertension.  ASSESSMENT AND PLAN:  1.  Severe systolic HTN. Newly diagnosis. Labs in April OK. Exam is benign and she is asymptomatic. Will go ahead and start amlodipine 5 mg daily. Avoid beta blocker with controlled AFib rate. Will arrange followup in 2 weeks. If BP still not controlled consider ARB.  2. Afib on Xarelto. Rate controlled. No Sx. 3. Pulmonary HTN by Echo. Normal right heart function.   Current medicines are reviewed at length with the patient today.  The patient does not have concerns regarding medicines.  The following changes have been made:  no change  Labs/ tests ordered today include: none No orders of the defined types were placed in this encounter.   Signed, Peter Swazil[place], MD  04/03/2017 1:44 PM    [facility] [address], [place], [place], [address] Phone [phone], Fax [phone]

## 2017-04-03 NOTE — Patient Instructions (Signed)
Start taking amlodipine 5 mg daily  Continue your other medication  We will arrange follow up in 2 weeks.

## 2017-04-08 ENCOUNTER — Observation Stay (HOSPITAL_COMMUNITY)
Admission: EM | Admit: 2017-04-08 | Discharge: 2017-04-09 | Disposition: A | Discharge status: Home / Self Care | Payer: Medicare Other | Attending: Internal Medicine | Admitting: Internal Medicine

## 2017-04-08 ENCOUNTER — Telehealth: Payer: Self-pay | Admitting: Cardiovascular Disease

## 2017-04-08 ENCOUNTER — Encounter (HOSPITAL_COMMUNITY): Payer: Self-pay

## 2017-04-08 ENCOUNTER — Emergency Department (HOSPITAL_COMMUNITY): Payer: Medicare Other

## 2017-04-08 ENCOUNTER — Ambulatory Visit (HOSPITAL_COMMUNITY)
Admission: EM | Disposition: A | Discharge status: Home / Self Care | Payer: Self-pay | Source: Home / Self Care | Attending: Emergency Medicine

## 2017-04-08 DIAGNOSIS — Z7901 Long term (current) use of anticoagulants: Secondary | ICD-10-CM | POA: Insufficient documentation

## 2017-04-08 DIAGNOSIS — I10 Essential (primary) hypertension: Secondary | ICD-10-CM | POA: Diagnosis not present

## 2017-04-08 DIAGNOSIS — I7300 Raynaud's syndrome without gangrene: Secondary | ICD-10-CM | POA: Diagnosis not present

## 2017-04-08 DIAGNOSIS — M341 CR(E)ST syndrome: Secondary | ICD-10-CM | POA: Diagnosis not present

## 2017-04-08 DIAGNOSIS — I4892 Unspecified atrial flutter: Secondary | ICD-10-CM | POA: Insufficient documentation

## 2017-04-08 DIAGNOSIS — R001 Bradycardia, unspecified: Secondary | ICD-10-CM

## 2017-04-08 DIAGNOSIS — I480 Paroxysmal atrial fibrillation: Secondary | ICD-10-CM | POA: Diagnosis not present

## 2017-04-08 DIAGNOSIS — I4510 Unspecified right bundle-branch block: Secondary | ICD-10-CM | POA: Insufficient documentation

## 2017-04-08 DIAGNOSIS — Z950 Presence of cardiac pacemaker: Secondary | ICD-10-CM

## 2017-04-08 DIAGNOSIS — Z8249 Family history of ischemic heart disease and other diseases of the circulatory system: Secondary | ICD-10-CM | POA: Diagnosis not present

## 2017-04-08 DIAGNOSIS — I272 Pulmonary hypertension, unspecified: Secondary | ICD-10-CM | POA: Insufficient documentation

## 2017-04-08 DIAGNOSIS — Z90710 Acquired absence of both cervix and uterus: Secondary | ICD-10-CM | POA: Insufficient documentation

## 2017-04-08 DIAGNOSIS — R110 Nausea: Secondary | ICD-10-CM | POA: Insufficient documentation

## 2017-04-08 DIAGNOSIS — I441 Atrioventricular block, second degree: Secondary | ICD-10-CM

## 2017-04-08 DIAGNOSIS — Z79899 Other long term (current) drug therapy: Secondary | ICD-10-CM | POA: Insufficient documentation

## 2017-04-08 HISTORY — DX: Pulmonary hypertension, unspecified: I27.20

## 2017-04-08 HISTORY — DX: Nausea: R11.0

## 2017-04-08 HISTORY — DX: Bradycardia, unspecified: R00.1

## 2017-04-08 HISTORY — PX: PACEMAKER IMPLANT: EP1218

## 2017-04-08 HISTORY — DX: Cr(e)st syndrome: M34.1

## 2017-04-08 HISTORY — DX: Essential (primary) hypertension: I10

## 2017-04-08 LAB — COMPREHENSIVE METABOLIC PANEL
ALT: 12 U/L — AB (ref 14–54)
AST: 21 U/L (ref 15–41)
Albumin: 3.9 g/dL (ref 3.5–5.0)
Alkaline Phosphatase: 89 U/L (ref 38–126)
Anion gap: 6 (ref 5–15)
BUN: 9 mg/dL (ref 6–20)
CALCIUM: 9.1 mg/dL (ref 8.9–10.3)
CHLORIDE: 106 mmol/L (ref 101–111)
CO2: 25 mmol/L (ref 22–32)
CREATININE: 0.74 mg/dL (ref 0.44–1.00)
Glucose, Bld: 105 mg/dL — ABNORMAL HIGH (ref 65–99)
Potassium: 4.1 mmol/L (ref 3.5–5.1)
Sodium: 137 mmol/L (ref 135–145)
Total Bilirubin: 1.2 mg/dL (ref 0.3–1.2)
Total Protein: 6.2 g/dL — ABNORMAL LOW (ref 6.5–8.1)

## 2017-04-08 LAB — CBC WITH DIFFERENTIAL/PLATELET
BASOS PCT: 0 %
Basophils Absolute: 0 10*3/uL (ref 0.0–0.1)
EOS ABS: 0 10*3/uL (ref 0.0–0.7)
Eosinophils Relative: 0 %
HCT: 39.6 % (ref 36.0–46.0)
HEMOGLOBIN: 13 g/dL (ref 12.0–15.0)
LYMPHS ABS: 0.6 10*3/uL — AB (ref 0.7–4.0)
Lymphocytes Relative: 9 %
MCH: 32 pg (ref 26.0–34.0)
MCHC: 32.8 g/dL (ref 30.0–36.0)
MCV: 97.5 fL (ref 78.0–100.0)
Monocytes Absolute: 0.3 10*3/uL (ref 0.1–1.0)
Monocytes Relative: 4 %
NEUTROS PCT: 87 %
Neutro Abs: 6.2 10*3/uL (ref 1.7–7.7)
Platelets: 111 10*3/uL — ABNORMAL LOW (ref 150–400)
RBC: 4.06 MIL/uL (ref 3.87–5.11)
RDW: 15.1 % (ref 11.5–15.5)
WBC: 7.1 10*3/uL (ref 4.0–10.5)

## 2017-04-08 LAB — URINALYSIS, ROUTINE W REFLEX MICROSCOPIC
Bacteria, UA: NONE SEEN
Bilirubin Urine: NEGATIVE
GLUCOSE, UA: NEGATIVE mg/dL
KETONES UR: 5 mg/dL — AB
Nitrite: NEGATIVE
PROTEIN: 30 mg/dL — AB
Specific Gravity, Urine: 1.008 (ref 1.005–1.030)
Squamous Epithelial / LPF: NONE SEEN
pH: 6 (ref 5.0–8.0)

## 2017-04-08 LAB — I-STAT CHEM 8, ED
BUN: 10 mg/dL (ref 6–20)
CHLORIDE: 104 mmol/L (ref 101–111)
Calcium, Ion: 1.11 mmol/L — ABNORMAL LOW (ref 1.15–1.40)
Creatinine, Ser: 0.6 mg/dL (ref 0.44–1.00)
Glucose, Bld: 100 mg/dL — ABNORMAL HIGH (ref 65–99)
HEMATOCRIT: 39 % (ref 36.0–46.0)
Hemoglobin: 13.3 g/dL (ref 12.0–15.0)
Potassium: 4.1 mmol/L (ref 3.5–5.1)
SODIUM: 138 mmol/L (ref 135–145)
TCO2: 22 mmol/L (ref 0–100)

## 2017-04-08 LAB — I-STAT TROPONIN, ED: Troponin i, poc: 0.02 ng/mL (ref 0.00–0.08)

## 2017-04-08 LAB — HEPARIN LEVEL (UNFRACTIONATED): Heparin Unfractionated: <0.1 IU/mL — ABNORMAL LOW (ref 0.30–0.70)

## 2017-04-08 LAB — BRAIN NATRIURETIC PEPTIDE: B Natriuretic Peptide: 402.7 pg/mL — ABNORMAL HIGH (ref 0.0–100.0)

## 2017-04-08 LAB — APTT: aPTT: 29 seconds (ref 24–36)

## 2017-04-08 LAB — PROTIME-INR
INR: 1.07
Prothrombin Time: 13.9 seconds (ref 11.4–15.2)

## 2017-04-08 LAB — TSH: TSH: 9.622 u[IU]/mL — ABNORMAL HIGH (ref 0.350–4.500)

## 2017-04-08 SURGERY — PACEMAKER IMPLANT

## 2017-04-08 MED ORDER — SIMVASTATIN 40 MG PO TABS
40.0000 mg | ORAL_TABLET | Freq: Every day | ORAL | Status: DC
  Administered 2017-04-08 – 2017-04-09 (×2): 40 mg via ORAL
  Filled 2017-04-08 (×2): qty 1

## 2017-04-08 MED ORDER — ONE-DAILY MULTI VITAMINS PO TABS: 1.0000 | ORAL_TABLET | Freq: Every day | ORAL | Status: DC

## 2017-04-08 MED ORDER — CHLORHEXIDINE GLUCONATE 4 % EX LIQD
60.0000 mL | Freq: Once | CUTANEOUS | Status: AC
  Administered 2017-04-08: 4 via TOPICAL

## 2017-04-08 MED ORDER — ONDANSETRON HCL 4 MG/2ML IJ SOLN: 4.0000 mg | Freq: Four times a day (QID) | INTRAMUSCULAR | Status: DC | PRN

## 2017-04-08 MED ORDER — FENTANYL CITRATE (PF) 100 MCG/2ML IJ SOLN
INTRAMUSCULAR | Status: AC
  Filled 2017-04-08: qty 2

## 2017-04-08 MED ORDER — SODIUM CHLORIDE 0.9% FLUSH: 3.0000 mL | Freq: Two times a day (BID) | INTRAVENOUS | Status: DC

## 2017-04-08 MED ORDER — HEPARIN (PORCINE) IN NACL 2-0.9 UNIT/ML-% IJ SOLN
INTRAMUSCULAR | Status: AC
  Filled 2017-04-08: qty 500

## 2017-04-08 MED ORDER — ACETAMINOPHEN 325 MG PO TABS: 325.0000 mg | ORAL_TABLET | ORAL | Status: DC | PRN

## 2017-04-08 MED ORDER — AMLODIPINE BESYLATE 10 MG PO TABS
10.0000 mg | ORAL_TABLET | Freq: Every day | ORAL | Status: DC
  Administered 2017-04-08 – 2017-04-09 (×2): 10 mg via ORAL
  Filled 2017-04-08 (×2): qty 1

## 2017-04-08 MED ORDER — FENTANYL CITRATE (PF) 100 MCG/2ML IJ SOLN
INTRAMUSCULAR | Status: DC | PRN
  Administered 2017-04-08 (×2): 12.5 ug via INTRAVENOUS

## 2017-04-08 MED ORDER — ADULT MULTIVITAMIN W/MINERALS CH
1.0000 | ORAL_TABLET | Freq: Every day | ORAL | Status: DC
  Administered 2017-04-08: 1 via ORAL
  Filled 2017-04-08 (×2): qty 1

## 2017-04-08 MED ORDER — CHLORHEXIDINE GLUCONATE 4 % EX LIQD
60.0000 mL | Freq: Once | CUTANEOUS | Status: DC
  Filled 2017-04-08: qty 60

## 2017-04-08 MED ORDER — SODIUM CHLORIDE 0.9 % IV SOLN
250.0000 mL | INTRAVENOUS | Status: DC
  Administered 2017-04-08: 250 mL via INTRAVENOUS

## 2017-04-08 MED ORDER — LEVOTHYROXINE SODIUM 100 MCG PO TABS
100.0000 ug | ORAL_TABLET | Freq: Every day | ORAL | Status: DC
  Administered 2017-04-09: 100 ug via ORAL
  Filled 2017-04-08: qty 1

## 2017-04-08 MED ORDER — SODIUM CHLORIDE 0.9 % IR SOLN
80.0000 mg | Status: AC
  Administered 2017-04-08: 80 mg

## 2017-04-08 MED ORDER — CEFAZOLIN SODIUM-DEXTROSE 1-4 GM/50ML-% IV SOLN
1.0000 g | Freq: Four times a day (QID) | INTRAVENOUS | Status: AC
  Administered 2017-04-08 – 2017-04-09 (×3): 1 g via INTRAVENOUS
  Filled 2017-04-08 (×3): qty 50

## 2017-04-08 MED ORDER — CEFAZOLIN SODIUM-DEXTROSE 2-4 GM/100ML-% IV SOLN
INTRAVENOUS | Status: AC
  Filled 2017-04-08: qty 100

## 2017-04-08 MED ORDER — MIDAZOLAM HCL 5 MG/5ML IJ SOLN
INTRAMUSCULAR | Status: AC
  Filled 2017-04-08: qty 5

## 2017-04-08 MED ORDER — LIDOCAINE HCL (PF) 1 % IJ SOLN
INTRAMUSCULAR | Status: AC
  Filled 2017-04-08: qty 60

## 2017-04-08 MED ORDER — SODIUM CHLORIDE 0.9 % IV SOLN
INTRAVENOUS | Status: DC
  Administered 2017-04-08: 16:00:00 via INTRAVENOUS

## 2017-04-08 MED ORDER — MIDAZOLAM HCL 5 MG/5ML IJ SOLN
INTRAMUSCULAR | Status: DC | PRN
  Administered 2017-04-08 (×2): 1 mg via INTRAVENOUS

## 2017-04-08 MED ORDER — SODIUM CHLORIDE 0.9% FLUSH: 3.0000 mL | INTRAVENOUS | Status: DC | PRN

## 2017-04-08 MED ORDER — SODIUM CHLORIDE 0.9 % IR SOLN
Status: AC
  Filled 2017-04-08: qty 2

## 2017-04-08 MED ORDER — LIDOCAINE HCL (PF) 1 % IJ SOLN
INTRAMUSCULAR | Status: DC | PRN
  Administered 2017-04-08: 45 mL via INTRADERMAL

## 2017-04-08 MED ORDER — CEFAZOLIN SODIUM-DEXTROSE 2-4 GM/100ML-% IV SOLN
2.0000 g | INTRAVENOUS | Status: AC
  Administered 2017-04-08: 2 g via INTRAVENOUS

## 2017-04-08 MED ORDER — HEPARIN (PORCINE) IN NACL 2-0.9 UNIT/ML-% IJ SOLN
INTRAMUSCULAR | Status: AC | PRN
  Administered 2017-04-08: 500 mL

## 2017-04-08 SURGICAL SUPPLY — 10 items
CABLE SURGICAL S-101-97-12 (CABLE) ×3 IMPLANT
CATH LOCATOR PLUS 1292 (MISCELLANEOUS) ×3 IMPLANT
LEAD OPTISENSE OPT 1999-52CM (Lead) ×3 IMPLANT
LEAD TENDRIL MRI 46CM LPA1200M (Lead) ×3 IMPLANT
LOCATOR PLUS 1281 (MISCELLANEOUS) ×3 IMPLANT
PACEMAKER ASSURITY DR-RF (Pacemaker) ×3 IMPLANT
PAD DEFIB LIFELINK (PAD) ×3 IMPLANT
SHEATH CLASSIC 7F (SHEATH) ×3 IMPLANT
SHEATH CLASSIC 8F (SHEATH) ×3 IMPLANT
TRAY PACEMAKER INSERTION (PACKS) ×3 IMPLANT

## 2017-04-08 NOTE — ED Notes (Signed)
Cardiology at bedside.

## 2017-04-08 NOTE — Telephone Encounter (Signed)
Returned the call to the pharmacist. She wanted to know if the patient had been receiving samples of Eliquis from the practice. Per the epic notes, there was not any documentation of samples given to her.

## 2017-04-08 NOTE — ED Triage Notes (Signed)
Pt presents to the ed with ems for feeling fatigued and nauseous for 2 weeks. Pt was bradycardic with ems, denies any chest pain, shortness of breath or dizziness.

## 2017-04-08 NOTE — ED Notes (Signed)
Reports nausea and fatigue x 2 weeks.

## 2017-04-08 NOTE — Telephone Encounter (Signed)
°  New Prob   Has some questions regarding blood thinner patient is receiving samples of. Please call.

## 2017-04-08 NOTE — ED Provider Notes (Signed)
[facility]EMERGENCY DEPT Provider Note   CSN: [id number] Arrival date & time: 04/08/17  1000     History   Chief Complaint Chief Complaint  Patient presents with  . Fatigue    HPI Crystal Espinoza is a 83 y.o. female.  HPI   Presents with concern for nausea, shortness of breath and fatigue. Reports that she's had fatigue and decreased appetite over the last 3 weeks, with episodes of nausea and shortness of breath.  Episode today began around 730AM after she woke up. She had been seen by her primary care physician was going to be referred to GI. She reports last week she saw her PCP and her blood pressures were high, however was not sure if she had any low heart rates. Denies any chest pain or discomfort, arm pain or discomfort or neck pain or discomfort. Denies vomiting, diarrhea, abdominal pain. Denies numbness, weakness, facial droop. Reports severe fatigue. Reports she saw Dr. SwazilandJordan last week and was given a prescription for amlodipine.   Past Medical History:  Diagnosis Date  . Cataract   . CREST syndrome (HCC)   . Hypertension   . Nausea   . Paroxysmal atrial fibrillation (HCC) 02/02/2017  . Severe pulmonary hypertension (HCC)    on cath 02/2017    Patient Active Problem List   Diagnosis Date Noted  . Symptomatic bradycardia 04/08/2017  . Paroxysmal atrial fibrillation (HCC) 02/02/2017  . RBBB 02/27/2014  . PAC (premature atrial contraction) 02/27/2014  . CREST syndrome (HCC) 02/27/2014    Past Surgical History:  Procedure Laterality Date  . ABDOMINAL HYSTERECTOMY    . EYE SURGERY      OB History    No data available       Home Medications    Prior to Admission medications   Medication Sig Start Date End Date Taking? Authorizing Provider  acetaminophen (TYLENOL) 325 MG tablet Take 650 mg by mouth daily.   Yes [provider]  amLODipine (NORVASC) 5 MG tablet Take 1 tablet (5 mg total) by mouth daily. 04/03/17 03/29/18 Yes SwazilandJordan, Peter M, MD    calcium citrate-vitamin D (CITRACAL+D) 315-200 MG-UNIT per tablet Take 1 tablet by mouth 2 (two) times daily.   Yes [provider]  dexlansoprazole (DEXILANT) 60 MG capsule Take 60 mg by mouth daily.   Yes [provider]  Glucosamine 500 MG CAPS Take 2 capsules by mouth daily.   Yes [provider]  levothyroxine (SYNTHROID) 100 MCG tablet Take 1 tablet by mouth daily.   Yes [provider]  Multiple Vitamin (MULTIVITAMIN) tablet Take 1 tablet by mouth daily.   Yes [provider]  simvastatin (ZOCOR) 40 MG tablet Take 1 tablet by mouth daily.   Yes [provider]  traMADol (ULTRAM) 50 MG tablet Take 50 mg by mouth 2 (two) times daily.   Yes [provider]  rivaroxaban (XARELTO) 20 MG TABS tablet Take 1 tablet (20 mg total) by mouth daily with supper. Patient not taking: Reported on 04/08/2017 01/31/17   Chilton Siandolph, Tiffany, MD    Family History Family History  Problem Relation Age of Onset  . Heart disease Mother   . Stroke Father   . Stroke Sister   . Stroke Sister   . Cancer Sister   . Diabetes Sister     Social History Social History  Substance Use Topics  . Smoking status: Never Smoker  . Smokeless tobacco: Never Used  . Alcohol use No     Allergies  Patient has no known allergies.   Review of Systems Review of Systems  Constitutional: Positive for fatigue. Negative for fever.  HENT: Negative for sore throat.   Eyes: Negative for visual disturbance.  Respiratory: Positive for shortness of breath. Negative for cough.   Cardiovascular: Negative for chest pain.  Gastrointestinal: Positive for nausea. Negative for abdominal pain, diarrhea and vomiting.  Genitourinary: Negative for difficulty urinating and dysuria.  Musculoskeletal: Negative for back pain and neck pain.  Skin: Negative for rash.  Neurological: Negative for syncope, weakness, numbness and headaches.     Physical Exam Updated Vital  Signs BP (!) 187/88   Pulse 68   Temp 98.4 F (36.9 C) (Oral)   Resp (!) 23   Wt 60.8 kg (134 lb)   SpO2 98%   BMI 23.00 kg/m   Physical Exam  Constitutional: She is oriented to person, place, and time. She appears well-developed and well-nourished. No distress.  HENT:  Head: Normocephalic and atraumatic.  Eyes: Conjunctivae and EOM are normal.  Neck: Normal range of motion.  Cardiovascular: Normal rate, regular rhythm, normal heart sounds and intact distal pulses.  Exam reveals no gallop and no friction rub.   No murmur heard. Pulmonary/Chest: Effort normal. No respiratory distress. She has no wheezes. She has rales (bibasilar).  Abdominal: Soft. She exhibits no distension. There is no tenderness. There is no guarding.  Musculoskeletal: She exhibits no edema or tenderness.  Neurological: She is alert and oriented to person, place, and time.  Skin: Skin is warm and dry. No rash noted. She is not diaphoretic. No erythema.  Nursing note and vitals reviewed.    ED Treatments / Results  Labs (all labs ordered are listed, but only abnormal results are displayed) Labs Reviewed  COMPREHENSIVE METABOLIC PANEL - Abnormal; Notable for the following:       Result Value   Glucose, Bld 105 (*)    Total Protein 6.2 (*)    ALT 12 (*)    All other components within normal limits  CBC WITH DIFFERENTIAL/PLATELET - Abnormal; Notable for the following:    Platelets 111 (*)    Lymphs Abs 0.6 (*)    All other components within normal limits  URINALYSIS, ROUTINE W REFLEX MICROSCOPIC - Abnormal; Notable for the following:    Color, Urine STRAW (*)    Hgb urine dipstick MODERATE (*)    Ketones, ur 5 (*)    Protein, ur 30 (*)    Leukocytes, UA TRACE (*)    All other components within normal limits  BRAIN NATRIURETIC PEPTIDE - Abnormal; Notable for the following:    B Natriuretic Peptide 402.7 (*)    All other components within normal limits  HEPARIN LEVEL (UNFRACTIONATED) - Abnormal;  Notable for the following:    Heparin Unfractionated <0.10 (*)    All other components within normal limits  TSH - Abnormal; Notable for the following:    TSH 9.622 (*)    All other components within normal limits  I-STAT CHEM 8, ED - Abnormal; Notable for the following:    Glucose, Bld 100 (*)    Calcium, Ion 1.11 (*)    All other components within normal limits  APTT  PROTIME-INR  BASIC METABOLIC PANEL  CBC  I-STAT TROPOININ, ED    EKG  EKG Interpretation  Date/Time:  Tuesday April 08 2017 10:07:21 EDT Ventricular Rate:  43 PR Interval:    QRS Duration: 131 QT Interval:  507 QTC Calculation: 429 R Axis:  101 Text Interpretation:  Sinus bradycardia Probable left atrial enlargement Right bundle branch block Borderline ST depression, lateral leads No previous ECGs available Confirmed by Alvira Monday ([id number]) on 04/08/2017 10:13:00 AM       Radiology Dg Chest Portable 1 View  Result Date: 04/08/2017 CLINICAL DATA:  Bradycardia EXAM: PORTABLE CHEST 1 VIEW COMPARISON:  10/29/2012 FINDINGS: There is a trace right pleural effusion. There is bilateral mild interstitial thickening. There is no focal consolidation. There is no pneumothorax. There is no focal parenchymal opacity. There is no pleural effusion or pneumothorax. There is stable cardiomegaly. There is thoracic aortic atherosclerosis. The osseous structures are unremarkable. IMPRESSION: Mild pulmonary edema. Electronically Signed   By: Elige Ko   On: 04/08/2017 10:36    Procedures Procedures (including critical care time)  Medications Ordered in ED Medications  amLODipine (NORVASC) tablet 10 mg (10 mg Oral Given 04/08/17 1840)  levothyroxine (SYNTHROID, LEVOTHROID) tablet 100 mcg ( Oral MAR Unhold 04/08/17 1825)  simvastatin (ZOCOR) tablet 40 mg (40 mg Oral Given 04/08/17 1840)  multivitamin with minerals tablet 1 tablet (1 tablet Oral Given 04/08/17 1840)  acetaminophen (TYLENOL) tablet 325-650 mg (not administered)   ondansetron (ZOFRAN) injection 4 mg (not administered)  ceFAZolin (ANCEF) IVPB 1 g/50 mL premix (not administered)  gentamicin (GARAMYCIN) 80 mg in sodium chloride irrigation 0.9 % 500 mL irrigation (80 mg Irrigation Given 04/08/17 1749)  chlorhexidine (HIBICLENS) 4 % liquid 4 application (4 application Topical Given 04/08/17 1544)  ceFAZolin (ANCEF) IVPB 2g/100 mL premix (0 g Intravenous Stopped 04/08/17 1710)  heparin infusion 2 units/mL in 0.9 % sodium chloride (500 mLs Other New Bag/Given 04/08/17 1746)   CRITICAL CARE: symptomatic bradycardia Performed by: Rhae Lerner   Total critical care time: 30 minutes  Critical care time was exclusive of separately billable procedures and treating other patients.  Critical care was necessary to treat or prevent imminent or life-threatening deterioration.  Critical care was time spent personally by me on the following activities: development of treatment plan with patient and/or surrogate as well as nursing, discussions with consultants, evaluation of patient's response to treatment, examination of patient, obtaining history from patient or surrogate, ordering and performing treatments and interventions, ordering and review of laboratory studies, ordering and review of radiographic studies, pulse oximetry and re-evaluation of patient's condition.   Initial Impression / Assessment and Plan / ED Course  I have reviewed the triage vital signs and the nursing notes.  Pertinent labs & imaging results that were available during my care of the patient were reviewed by me and considered in my medical decision making (see chart for details).    83 year old female with a history of atrial fibrillation on Xarelto, pulmonary hypertension, hypertension with new severe systolic hypertension noted 5 days ago and patient started on amlodipine, who presents with fatigue, nausea and shortness of breath.  Patient bradycardic to the 30s on arrival to the  emergency department. EKG shows 2-1 AV block. She is not on medications that would cause bradycardia on my review, no hyperkalemia on labs.  Call cardiology for consultation for concern of symptomatically bradycardia. Patient is otherwise stable, with elevated blood pressures, no altered mental status or chest pain. Troponin is negative. Chest x-ray shows mild pulmonary edema. BNP 402.   Cardiology consulted and admitted, likely pacemaker placement this admission.   Final Clinical Impressions(s) / ED Diagnoses   Final diagnoses:  Symptomatic bradycardia    New Prescriptions Current Discharge Medication List  Alvira Monday, MD 04/08/17 [phone]

## 2017-04-08 NOTE — ED Notes (Signed)
edp notified of heart rate ranging between 30 and 40. ekg done and zoll pads placed on patient.

## 2017-04-08 NOTE — Interval H&P Note (Signed)
History and Physical Interval Note:  04/08/2017 2:46 PM  Crystal Espinoza  has presented today for surgery, with the diagnosis of hb  The various methods of treatment have been discussed with the patient and family. After consideration of risks, benefits and other options for treatment, the patient has consented to  Procedure(s): Pacemaker Implant (N/A) as a surgical intervention .  The patient's history has been reviewed, patient examined, no change in status, stable for surgery.  I have reviewed the patient's chart and labs.  Questions were answered to the patient's satisfaction.     Lewayne BuntingGregg Daiki Dicostanzo

## 2017-04-08 NOTE — H&P (Signed)
Cardiology Admission History and Physical:   Patient ID: Crystal Espinoza; [id number]; 1929/09/03   Admission date: 04/08/2017  Primary Care Provider: Merri Brunette, MD Primary Cardiologist: Dr. Duke Salvia Primary Electrophysiologist:  New to Dr. Ladona Ridgel  Patient Profile:   Crystal Espinoza is a 83 y.o. female with a history of paroxysmal atrial flutter, CREST syndrome with sclerodactyly and Raynauds, HTN and chronic RBBB presented by EMS for fatigue and nausea.  Seen by Dr. Duke Salvia 01/2017 for new onset rate controlled atrial flutter. She is not on nny rate control agent. She was scheduled to have EGD for ongoing nausea with Dr. Elnoria Howard on 04/03/17 however noted to have blood pressure of 230/130. Her procedure was canceled and seen by Dr. Swaziland in clinic for further evaluation. She was started on amlodipine 5 mg daily.  Avoid beta blocker with controlled AFib/flutter rate.   Last echocardiogram 02/13/2017 showed normal LV function of 60-65%, no wall motion abnormality. Normal RV size and systolic function. Moderate biatrial enlargement. Severe pulmonary hypertension.  Patient was on Eliquis 01/2017. Changed to Xarelto (unknown reason). Seems patient is getting Xarelto samples from clinic. Pharmacy is trying to find out the name of anticoagulation and last dose. No clear hx from patient.   History of Present Illness:   Crystal Espinoza had an episode of severe shortness of breath and fatigue after she woke up this morning. EMS was called and brought to ER for further evaluation. She did noticed dizziness. No fall, syncope, orthopnea, PND, lower extremity edema on melena. Recent dictating with nausea for the past 3-4 weeks. EGD cancelled  as above. EKG showed sinus bradycardia at rate of 41 bpm with 2-1 conduction and nonspecific ST/T-wave changes - personally reviewed. She denies any chest pain. Point-of-care troponin negative. CXR showed mild pulmonary edema.    Past Medical History:  Diagnosis Date    . Cataract   . CREST syndrome (HCC)   . Hypertension   . Nausea   . Paroxysmal atrial fibrillation (HCC) 02/02/2017  . Severe pulmonary hypertension (HCC)    on cath 02/2017    Past Surgical History:  Procedure Laterality Date  . ABDOMINAL HYSTERECTOMY    . EYE SURGERY       Medications Prior to Admission: Prior to Admission medications   Medication Sig Start Date End Date Taking? Authorizing Provider  acetaminophen (TYLENOL) 325 MG tablet Take 650 mg by mouth daily.   Yes [provider]  amLODipine (NORVASC) 5 MG tablet Take 1 tablet (5 mg total) by mouth daily. 04/03/17 03/29/18 Yes [place], Peter M, MD  calcium citrate-vitamin D (CITRACAL+D) 315-200 MG-UNIT per tablet Take 1 tablet by mouth 2 (two) times daily.   Yes [provider]  dexlansoprazole (DEXILANT) 60 MG capsule Take 60 mg by mouth daily.   Yes [provider]  Glucosamine 500 MG CAPS Take 2 capsules by mouth daily.   Yes [provider]  levothyroxine (SYNTHROID) 100 MCG tablet Take 1 tablet by mouth daily.   Yes [provider]  Multiple Vitamin (MULTIVITAMIN) tablet Take 1 tablet by mouth daily.   Yes [provider]  simvastatin (ZOCOR) 40 MG tablet Take 1 tablet by mouth daily.   Yes [provider]  traMADol (ULTRAM) 50 MG tablet Take 50 mg by mouth 2 (two) times daily.   Yes [provider]  rivaroxaban (XARELTO) 20 MG TABS tablet Take 1 tablet (20 mg total) by mouth daily with supper. Patient not taking: Reported on 04/08/2017 01/31/17  Chilton Si, MD     Allergies:   No Known Allergies  Social History:   Social History   Social History  . Marital status: Married    Spouse name: N/A  . Number of children: N/A  . Years of education: N/A   Occupational History  . Not on file.   Social History Main Topics  . Smoking status: Never Smoker  . Smokeless tobacco: Never Used  . Alcohol use No  . Drug use: No  . Sexual activity:  No   Other Topics Concern  . Not on file   Social History Narrative  . No narrative on file    Family History:   The patient's family history includes Cancer in her sister; Diabetes in her sister; Heart disease in her mother; Stroke in her father, sister, and sister.    ROS:  Please see the history of present illness.  All other ROS reviewed and negative.     Physical Exam/Data:   Vitals:   04/08/17 1002 04/08/17 1007 04/08/17 1015 04/08/17 1030  BP:  (!) 174/70 (!) 183/56 (!) 178/56  Pulse:  (!) 42 (!) 41 (!) 39  Resp:  18 19 (!) 24  Temp:  98.3 F (36.8 C)    SpO2:  90% 96% 96%  Weight: 134 lb (60.8 kg)      No intake or output data in the 24 hours ending 04/08/17 1200 Filed Weights   04/08/17 1002  Weight: 134 lb (60.8 kg)   Body mass index is 23 kg/m.  General:  Well nourished, well developed, in no acute distress HEENT: normal Lymph: no adenopathy Neck: no JVD Endocrine:  No thryomegaly Vascular: No carotid bruits; FA pulses 2+ bilaterally without bruits  Cardiac:  normal S1, S2; RRR; no murmur  Lungs:  Faint bibasilar rales Abd: soft, nontender, no hepatomegaly  Ext: no edema Musculoskeletal:  No deformities, BUE and BLE strength normal and equal Skin: warm and dry  Neuro:  CNs 2-12 intact, no focal abnormalities noted Psych:  Normal affect   Relevant CV Studies: Echo 02/13/17 Study Conclusions  - Left ventricle: The cavity size was normal. Wall thickness was   increased in a pattern of mild LVH. Indeterminant diastolic   function. Systolic function was normal. The estimated ejection   fraction was in the range of 60% to 65%. Wall motion was normal;   there were no regional wall motion abnormalities. - Aortic valve: There was no stenosis. - Mitral valve: There was trivial regurgitation. - Left atrium: The atrium was moderately dilated. - Right ventricle: The cavity size was normal. Systolic function   was normal. - Right atrium: The atrium was  moderately dilated. - Tricuspid valve: Peak RV-RA gradient (S): 69 mm Hg. - Pulmonary arteries: PA peak pressure: 72 mm Hg (S). - Inferior vena cava: The vessel was normal in size. The   respirophasic diameter changes were in the normal range (>= 50%),   consistent with normal central venous pressure.  Impressions:  - Normal LV size with mild LV hypertrophy. EF 60-65%. Normal RV   size and systolic function. Moderate biatrial enlargement. Severe   pulmonary hypertension.  Laboratory Data:  Chemistry  Recent Labs Lab 04/08/17 1016 04/08/17 1024  NA 137 138  K 4.1 4.1  CL 106 104  CO2 25  --   GLUCOSE 105* 100*  BUN 9 10  CREATININE 0.74 0.60  CALCIUM 9.1  --   GFRNONAA >60  --   GFRAA >60  --  ANIONGAP 6  --      Recent Labs Lab 04/08/17 1016  PROT 6.2*  ALBUMIN 3.9  AST 21  ALT 12*  ALKPHOS 89  BILITOT 1.2   Hematology  Recent Labs Lab 04/08/17 1016 04/08/17 1024  WBC 7.1  --   RBC 4.06  --   HGB 13.0 13.3  HCT 39.6 39.0  MCV 97.5  --   MCH 32.0  --   MCHC 32.8  --   RDW 15.1  --   PLT 111*  --    Cardiac EnzymesNo results for input(s): TROPONINI in the last 168 hours.   Recent Labs Lab 04/08/17 1023  TROPIPOC 0.02    BNPNo results for input(s): BNP, PROBNP in the last 168 hours.  DDimer No results for input(s): DDIMER in the last 168 hours.  Radiology/Studies:  Dg Chest Portable 1 View  Result Date: 04/08/2017 CLINICAL DATA:  Bradycardia EXAM: PORTABLE CHEST 1 VIEW COMPARISON:  10/29/2012 FINDINGS: There is a trace right pleural effusion. There is bilateral mild interstitial thickening. There is no focal consolidation. There is no pneumothorax. There is no focal parenchymal opacity. There is no pleural effusion or pneumothorax. There is stable cardiomegaly. There is thoracic aortic atherosclerosis. The osseous structures are unremarkable. IMPRESSION: Mild pulmonary edema. Electronically Signed   By: Elige KoHetal  Patel   On: 04/08/2017 10:36     Assessment and Plan:   1. Symptomatic bradycardia - EKG shows 2-1 conduction. Not on any any AV blocking agent. Likely pacemaker this admission. Pharmacy is finding ground last dose of anticoagulation vs labs. Keep NPO.   2. Paroxysmal atrial flutter/fibrillation - Her rate was controlled without any medication. Patient currently getting samples of anticoagulation from clinic. Pharmacy is evaluating. No clear history from patient. CHADSVASc score of 4.  3. Uncontrolled hypertension -Systolic Blood pressure in 170. Increase Norvasc to 10 mg.   4. Severe pulmonary hypertension  5. Nausea - Follow up with Dr. Elnoria HowardHung as schedule.  Severity of Illness: The appropriate patient status for this patient is INPATIENT. Inpatient status is judged to be reasonable and necessary in order to provide the required intensity of service to ensure the patient's safety. The patient's presenting symptoms, physical exam findings, and initial radiographic and laboratory data in the context of their chronic comorbidities is felt to place them at high risk for further clinical deterioration. Furthermore, it is not anticipated that the patient will be medically stable for discharge from the hospital within 2 midnights of admission. The following factors support the patient status of inpatient.   " The patient's presenting symptoms include symptomatic bradycardia. " The worrisome physical exam findings include -faint rales " The initial radiographic and laboratory data are worrisome because of 2-1 AV block " The chronic co-morbidities include PAF, severe pulmonary hypertension and crest syndrome   * I certify that at the point of admission it is my clinical judgment that the patient will require inpatient hospital care spanning beyond 2 midnights from the point of admission due to high intensity of service, high risk for further deterioration and high frequency of surveillance required.*     Signed, PenascoBhagat,Bhavinkumar, PA  04/08/2017 12:00 PM   EP Attending  Patient seen and examined. Agree with the findings as noted above. The patient has symptomatic 2:1 AV block and is not on any AV nodal blocking drugs. I have discussed the treatment options with the patient and after careful review, do not think she is on any anti-coagulation and she denies taking  any meds this morning. I have reviewed the indications/risks/benefits/goals/expectations of PPM insertion and she wishes to proceed.  Leonia Reeves.D.

## 2017-04-08 NOTE — ED Notes (Signed)
Repeat ekg competed per edp

## 2017-04-08 NOTE — Progress Notes (Addendum)
MEDICATION RELATED CONSULT NOTE - INITIAL   Attempted to clarify if pt had been taking Eliquis or Xarelto  Based on information from outpatient pharmacies and [facility], [facility]. HL and INR were ordered to help confirm this.  Masters, Darl HouseholderAlison M 04/08/2017,1:27 PM    1600 PM:  Labs indicate she was not taking any anti-coagulation prior to admission  Thank you. Okey RegalLisa Terryann Verbeek, PharmD

## 2017-04-08 NOTE — ED Notes (Signed)
Pt placed on bedpan

## 2017-04-08 NOTE — ED Notes (Signed)
ED Provider at bedside. 

## 2017-04-09 ENCOUNTER — Encounter (HOSPITAL_COMMUNITY): Payer: Self-pay | Admitting: Internal Medicine

## 2017-04-09 ENCOUNTER — Inpatient Hospital Stay (HOSPITAL_COMMUNITY): Payer: Medicare Other

## 2017-04-09 DIAGNOSIS — R001 Bradycardia, unspecified: Secondary | ICD-10-CM | POA: Diagnosis not present

## 2017-04-09 LAB — CBC
HCT: 39.9 % (ref 36.0–46.0)
Hemoglobin: 13 g/dL (ref 12.0–15.0)
MCH: 31.9 pg (ref 26.0–34.0)
MCHC: 32.6 g/dL (ref 30.0–36.0)
MCV: 98 fL (ref 78.0–100.0)
Platelets: 107 10*3/uL — ABNORMAL LOW (ref 150–400)
RBC: 4.07 MIL/uL (ref 3.87–5.11)
RDW: 15.1 % (ref 11.5–15.5)
WBC: 5.5 10*3/uL (ref 4.0–10.5)

## 2017-04-09 LAB — BASIC METABOLIC PANEL
Anion gap: 8 (ref 5–15)
BUN: 6 mg/dL (ref 6–20)
CHLORIDE: 104 mmol/L (ref 101–111)
CO2: 24 mmol/L (ref 22–32)
CREATININE: 0.64 mg/dL (ref 0.44–1.00)
Calcium: 9 mg/dL (ref 8.9–10.3)
Glucose, Bld: 90 mg/dL (ref 65–99)
Potassium: 3.8 mmol/L (ref 3.5–5.1)
SODIUM: 136 mmol/L (ref 135–145)

## 2017-04-09 MED ORDER — CARVEDILOL 3.125 MG PO TABS: 3.1250 mg | ORAL_TABLET | Freq: Two times a day (BID) | ORAL | 6 refills | Status: DC

## 2017-04-09 MED ORDER — RIVAROXABAN 20 MG PO TABS: 20.0000 mg | ORAL_TABLET | Freq: Every day | ORAL | 5 refills | Status: DC

## 2017-04-09 MED ORDER — AMLODIPINE BESYLATE 10 MG PO TABS: 10.0000 mg | ORAL_TABLET | Freq: Every day | ORAL | 6 refills | Status: DC

## 2017-04-09 MED FILL — Sodium Chloride Irrigation Soln 0.9%: Qty: 500 | Status: AC

## 2017-04-09 MED FILL — Cefazolin Sodium-Dextrose IV Solution 2 GM/100ML-4%: INTRAVENOUS | Qty: 100 | Status: AC

## 2017-04-09 MED FILL — Gentamicin Sulfate Inj 40 MG/ML: INTRAMUSCULAR | Qty: 2 | Status: AC

## 2017-04-09 NOTE — Care Management Obs Status (Signed)
MEDICARE OBSERVATION STATUS NOTIFICATION   Patient Details  Name: Crystal PlumbLouise B Laduca MRN: [id number] Date of Birth: 03/15/1929   Medicare Observation Status Notification Given:  Yes    Gala LewandowskyGraves-Bigelow, Rhayne Chatwin Kaye, RN 04/09/2017, 10:34 AM

## 2017-04-09 NOTE — Care Management CC44 (Signed)
Condition Code 44 Documentation Completed  Patient Details  Name: Crystal Espinoza MRN: [id number] Date of Birth: 06/05/1929   Condition Code 44 given:  Yes Patient signature on Condition Code 44 notice:  Yes Documentation of 2 MD's agreement:  Yes Code 44 added to claim:  Yes    Gala LewandowskyGraves-Bigelow, Akio Hudnall Kaye, RN 04/09/2017, 10:34 AM

## 2017-04-09 NOTE — Discharge Instructions (Signed)
Samples of the medicine Xarelto are [facility] hold [facility] [address] [facility].    Please go by and pick them up.  [address], [address] [place], [place]  [address] [phone]        Supplemental Discharge Instructions for  Pacemaker/Defibrillator Patients  Activity No heavy lifting or vigorous activity with your left/right arm for 6 to 8 weeks.  Do not raise your left/right arm above your head for one week.  Gradually raise your affected arm as drawn below.              04/11/17                    04/12/17                    04/13/17                     04/14/17         __  NO DRIVING  The patient does not drive  WOUND CARE - Keep the wound area clean and dry.  Do not get this area wet for one week. No showers for one week; you may shower on  04/14/17 . - The tape/steri-strips on your wound will fall off; do not pull them off.  No bandage is needed on the site.  DO  NOT apply any creams, oils, or ointments to the wound area. - If you notice any drainage or discharge from the wound, any swelling or bruising at the site, or you develop a fever > 101? F after you are discharged home, call the office at once.  Special Instructions - You are still able to use cellular telephones; use the ear opposite the side where you have your pacemaker/defibrillator.  Avoid carrying your cellular phone near your device. - When traveling through airports, show security personnel your identification card to avoid being screened in the metal detectors.  Ask the security personnel to use the hand wand. - Avoid arc welding equipment, MRI testing (magnetic resonance imaging), TENS units (transcutaneous nerve stimulators).  Call the office for questions about other devices. - Avoid electrical appliances that are in poor condition or are not properly grounded. - Microwave ovens are safe to be near or to operate.  Additional information for defibrillator patients should your device go off: - If your device  goes off ONCE and you feel fine afterward, notify the device clinic nurses. - If your device goes off ONCE and you do not feel well afterward, call 911. - If your device goes off TWICE, call 911. - If your device goes off THREE times in one day, call 911.  DO NOT DRIVE YOURSELF OR A FAMILY MEMBER WITH A DEFIBRILLATOR TO THE HOSPITAL--CALL 911.

## 2017-04-09 NOTE — Care Management Note (Signed)
Case Management Note  Patient Details  Name: Crystal Espinoza An MRN: [id number] Date of Birth: 08/11/1929  Subjective/Objective: Pt presented for symptomatic bradycardia-Post PPM. Pt is from home with husband. Pt has Medicare, however no Part D Rx. Coverage. Pt will be d/c on Xarelto.                 Action/Plan: PA to assist with having [facility].  [facility] to assist with patient assistance application. Pharmacy did speak with the patient in regards to Medicare Part D Rx Coverage during open enrollment. Pharmacist to walk and pick up samples. No further needs from CM at this time.   Expected Discharge Date:  04/09/17               Expected Discharge Plan:  Home/Self Care  In-House Referral:  NA  Discharge planning Services  CM Consult, Medication Assistance  Post Acute Care Choice:  NA Choice offered to:  NA  DME Arranged:  N/A DME Agency:  NA  HH Arranged:  NA HH Agency:  NA  Status of Service:  Completed, signed off  If discussed at Long Length of Stay Meetings, dates discussed:    Additional Comments:  Gala LewandowskyGraves-Bigelow, Della Homan Kaye, RN 04/09/2017, 11:00 AM

## 2017-04-09 NOTE — Consult Note (Signed)
           [facility] CM Primary Care Navigator  04/09/2017  [name] 09/16/1929 [id number]   Went to see patientat the bedsideto identify possible discharge needs but she was alreadydischarged.  Patient was discharged home today per staffreport.  Primary care provider's office called Shanda Bumps(Jessica for Caren)to notify of patient's discharge and need for post hospital follow-up and transition of care. Reminded of patient's health issues needing follow-up as well.  Made aware to refer [facility] to [facility] care management ifdeemed appropriatefor services.   For questions, please contact:  Wyatt HasteLorraine Osker Ayoub, BSN, RN- [facility] Primary Care Navigator  Telephone: [phone] [facility]

## 2017-04-09 NOTE — Plan of Care (Signed)
Problem: Safety: Goal: Ability to remain free from injury will improve Outcome: Progressing Pt to call for assist OOB for any reason, Pt instructed to keep LUE in sling and limit use.

## 2017-04-09 NOTE — Discharge Summary (Signed)
ELECTROPHYSIOLOGY PROCEDURE DISCHARGE SUMMARY    Patient ID: Crystal Espinoza,  MRN: [id number], DOB/AGE: 20-Nov-1928 83 y.o.  Admit date: 04/08/2017 Discharge date: 04/09/2017  Primary Care Physician: Merri Brunette, MD  Primary Cardiologist: Dr. Duke Salvia Electrophysiologist: Dr. Ladona Ridgel  Primary Discharge Diagnosis:  1. Symptomatic bradycardia     High AV block     S/p PPM implant 2. HTN  Secondary Discharge Diagnosis:  1. Paroxysmal AFlutter     CHA2DS2Vasc is at least 4, on xarelto 2. CREST syndrome w/ sclerodactyly and Raynauds   No Known Allergies   Procedures This Admission:  1.  Implantation of a SJM dual chamber PPM on 04/08/17 by Dr Mayford Knife.  The patient received a St. Jude (serial number [id number]) pacemaker St. Jude (serial number [id number]) right atrial lead and a St. Jude (serial number [id number]) right ventricular lead  There were no immediate post procedure complications. 2.  CXR on 04/09/17 demonstrated no pneumothorax status post device implantation.   Brief HPI: Crystal Espinoza is a 83 y.o. female was admitted to [facility] yesterday with symptoms of severe shortness of breath and fatigue after she woke up in the morning. EMS was called and brought to ER for further evaluation. She also noticed dizziness. denied fall, syncope, orthopnea, PND, lower extremity edema on melena.  She was noted to be bradycardic with 2:1 conduction with no reversible causes, admitted and planned for PPM implant.   Hospital Course:  The patient was admitted and underwent implantation of a PPM with details as outlined above.  She was monitored on telemetry overnight which demonstrated SR/V pacing.  Left chest was without hematoma or ecchymosis.  The device was interrogated and found to be functioning normally.  CXR was obtained and demonstrated no pneumothorax status post device implantation.  Wound care, arm mobility, and restrictions were reviewed with the patient and her husband  The  patient was examined by Dr. Ladona Ridgel and considered stable for discharge to home.  We will increase her Amlodipine to 10mg  daily and add on Coreg 3.125mg  BID for BP control.  There was question in regards to her a/c.  I had a lengthy discussion today, the patient and husband state the 1st blood thinner made her sick to her stomach (Eliquis), and have been getting samples of the last 2/2 cost.  This is listed as Xarelto and the patient/husband confirm this.  The patient assures me she is not taking Eliquis.   Her Creat Clearance is 63, will continue her Xarelto 20mg  daily, I have contacted our [address] office for samples to help with cost burden.  I have asked out [facility] office to initiate patient assistance efforts as well.   Physical Exam: Vitals:   04/09/17 0016 04/09/17 0419 04/09/17 0804 04/09/17 0900  BP: (!) 145/53 (!) 140/58 (!) 158/64   Pulse: 65 63 72   Resp:   20   Temp: 98.5 F (36.9 C) 98.6 F (37 C) 97.9 F (36.6 C)   TempSrc: Oral Oral Oral   SpO2: 91% 93% 95%   Weight:  130 lb 9.6 oz (59.2 kg)  130 lb 8.2 oz (59.2 kg)  Height:    5\' 4"  (1.626 m)    GEN- The patient is thin, frail, well appearing, alert and oriented x 3 today.   HEENT: normocephalic, atraumatic; sclera clear, conjunctiva pink; hearing intact; oropharynx clear; neck supple, no JVP Lungs- CTA b/l, normal work of breathing.  No wheezes, rales, rhonchi Heart- RRR, no murmurs, rubs or  gallops, PMI not laterally displaced GI- soft, non-tender, non-distended Extremities- no clubbing, cyanosis, or edema MS- no significant deformity or atrophy Skin- warm and dry, no rash or lesion, left chest without hematoma/ecchymosis Psych- euthymic mood, full affect Neuro- no gross deficits   Labs:   Lab Results  Component Value Date   WBC 5.5 04/09/2017   HGB 13.0 04/09/2017   HCT 39.9 04/09/2017   MCV 98.0 04/09/2017   PLT 107 (L) 04/09/2017     Recent Labs Lab 04/08/17 1016 04/08/17 1024  NA 137 138  K 4.1  4.1  CL 106 104  CO2 25  --   BUN 9 10  CREATININE 0.74 0.60  CALCIUM 9.1  --   PROT 6.2*  --   BILITOT 1.2  --   ALKPHOS 89  --   ALT 12*  --   AST 21  --   GLUCOSE 105* 100*    Discharge Medications:  Allergies as of 04/09/2017   No Known Allergies     Medication List    TAKE these medications   acetaminophen 325 MG tablet Commonly known as:  TYLENOL Take 650 mg by mouth daily.   amLODipine 10 MG tablet Commonly known as:  NORVASC Take 1 tablet (10 mg total) by mouth daily. Start taking on:  04/10/2017 What changed:  medication strength  how much to take   calcium citrate-vitamin D 315-200 MG-UNIT tablet Commonly known as:  CITRACAL+D Take 1 tablet by mouth 2 (two) times daily.   carvedilol 3.125 MG tablet Commonly known as:  COREG Take 1 tablet (3.125 mg total) by mouth 2 (two) times daily.   DEXILANT 60 MG capsule Generic drug:  dexlansoprazole Take 60 mg by mouth daily.   Glucosamine 500 MG Caps Take 2 capsules by mouth daily.   multivitamin tablet Take 1 tablet by mouth daily.   rivaroxaban 20 MG Tabs tablet Commonly known as:  XARELTO Take 1 tablet (20 mg total) by mouth daily with supper.   simvastatin 40 MG tablet Commonly known as:  ZOCOR Take 1 tablet by mouth daily.   SYNTHROID 100 MCG tablet Generic drug:  levothyroxine Take 1 tablet by mouth daily.   traMADol 50 MG tablet Commonly known as:  ULTRAM Take 50 mg by mouth 2 (two) times daily.       Disposition:  Home Discharge Instructions    Diet - low sodium heart healthy    Complete by:  As directed    Diet - low sodium heart healthy    Complete by:  As directed    Increase activity slowly    Complete by:  As directed    Increase activity slowly    Complete by:  As directed      Follow-up Information    [facility] [address] [facility] Follow up on 04/24/2017.   Specialty:  Cardiology Why:  2:30PM, wound check Contact information: [address], [address] [place] [id number] [phone]       Crystal Espinoza, Staley Lunz W, MD Follow up on 07/08/2017.   Specialty:  Cardiology Why:  10:45AM Contact information: [address] [place] [address] [phone]        Merri BrunettePharr, Walter, MD. Call.   Specialty:  Internal Medicine Why:  Call to make an appointment to follow up on your thyroid replacement/management Contact information: [address] [place] [address] [phone]           Duration of Discharge  Encounter: Greater than 30 minutes including physician time.  Crystal Fredrickson, PA-C 04/09/2017 10:54 AM   EP Attending  Patient seen and examined. Agree with above. She is s/p PPM insertion for 2:1 AV block and is doing well. Her device has been interogated under my supervison and is functioning normally. She will be discharged home with usual followup.  Leonia Reeves.D.

## 2017-04-18 ENCOUNTER — Encounter: Payer: Self-pay | Admitting: Physician Assistant

## 2017-04-18 ENCOUNTER — Ambulatory Visit (INDEPENDENT_AMBULATORY_CARE_PROVIDER_SITE_OTHER): Payer: Medicare Other | Admitting: Physician Assistant

## 2017-04-18 VITALS — BP 134/72 | HR 69 | Ht 64.5 in | Wt 132.2 lb

## 2017-04-18 DIAGNOSIS — I10 Essential (primary) hypertension: Secondary | ICD-10-CM | POA: Diagnosis not present

## 2017-04-18 DIAGNOSIS — R001 Bradycardia, unspecified: Secondary | ICD-10-CM | POA: Diagnosis not present

## 2017-04-18 DIAGNOSIS — I272 Pulmonary hypertension, unspecified: Secondary | ICD-10-CM

## 2017-04-18 DIAGNOSIS — I4892 Unspecified atrial flutter: Secondary | ICD-10-CM | POA: Diagnosis not present

## 2017-04-18 NOTE — Progress Notes (Signed)
Cardiology Office Note   Date:  04/18/2017   ID:  TERIAH Espinoza, DOB 1928-12-15, MRN [id number]  PCP:  Merri Brunette, MD  Cardiologist:  Dr Duke Salvia 01/31/2017 Crystal Demark, PA-C   Chief Complaint  Patient presents with  . Follow-up    BP check/seen for Dr. [place]    History of Present Illness: Crystal Espinoza is a 83 y.o. female with a history of Atrial flutter on Xarelto, HTN, CREST syndrome w/ Raynaud's and spondylodactyly, pulm HTN, RBBB. CHADSVASc score of 4 (age x 2, female, HTN)  06/21 consult by Dr Sw[place]and for severely elevated BP, amlodipine 5 mg daily added 06/26 admitted by Dr. Ladona Ridgel for fatigue and nausea, ECG with sinus bradycardia, heart rate in the low 40s, 2-1 conduction Mobitz 2>> St. Jude dual-chamber pacemaker implanted. Discharge 06/27.  Crystal Espinoza presents for post-hospital follow up  Since d/c from the hospital, she has been breathing better. The weakness and nausea is better also.   She walks in the house, is increasing her activity. She does not walk outside because of the heat. Her appetite is getting better. She is getting a little stronger.   No chest pain. No orthopnea or PND. No palpitations. No presyncope or syncope.  No LE edema, although she has some leg pain with ambulation, mostly in her knees. She has back problems chronically, and feels the leg pain is from her back problems. She has also been told she has arthritis in her legs.  She is tolerating the Xarelto and having no bleeding issues.   Past Medical History:  Diagnosis Date  . Cataract   . CREST syndrome (HCC)   . Hypertension   . Nausea   . Paroxysmal atrial fibrillation (HCC) 02/02/2017  . Severe pulmonary hypertension (HCC)    on cath 02/2017  . Symptomatic bradycardia 04/08/2017    Past Surgical History:  Procedure Laterality Date  . ABDOMINAL HYSTERECTOMY    . EYE SURGERY    . PACEMAKER IMPLANT N/A 04/08/2017   Procedure: Pacemaker Implant;  Surgeon:  Marinus Maw, MD;  [facility] INVASIVE CV LAB;  Service: Cardiovascular;  Laterality: N/A;    Current Outpatient Prescriptions  Medication Sig Dispense Refill  . acetaminophen (TYLENOL) 325 MG tablet Take 650 mg by mouth daily.    Marland Kitchen amLODipine (NORVASC) 10 MG tablet Take 1 tablet (10 mg total) by mouth daily. 30 tablet 6  . calcium citrate-vitamin D (CITRACAL+D) 315-200 MG-UNIT per tablet Take 1 tablet by mouth 2 (two) times daily.    . carvedilol (COREG) 3.125 MG tablet Take 1 tablet (3.125 mg total) by mouth 2 (two) times daily. 60 tablet 6  . dexlansoprazole (DEXILANT) 60 MG capsule Take 60 mg by mouth daily.    . Glucosamine 500 MG CAPS Take 2 capsules by mouth daily.    Marland Kitchen levothyroxine (SYNTHROID) 100 MCG tablet Take 1 tablet by mouth daily.    . Multiple Vitamin (MULTIVITAMIN) tablet Take 1 tablet by mouth daily.    . rivaroxaban (XARELTO) 20 MG TABS tablet Take 1 tablet (20 mg total) by mouth daily with supper. 30 tablet 5  . simvastatin (ZOCOR) 40 MG tablet Take 1 tablet by mouth daily.    . traMADol (ULTRAM) 50 MG tablet Take 50 mg by mouth 2 (two) times daily.     No current facility-administered medications for this visit.     Allergies:   Patient has no known allergies.    Social History:  The patient  reports that she has never smoked. She has never used smokeless tobacco. She reports that she does not drink alcohol or use drugs.   Family History:  The patient's family history includes Cancer in her sister; Diabetes in her sister; Heart disease in her mother; Stroke in her father, sister, and sister.    ROS:  Please see the history of present illness. All other systems are reviewed and negative.    PHYSICAL EXAM: VS:  BP 134/72   Pulse 69   Ht 5' 4.5" (1.638 m)   Wt 132 lb 3.2 oz (60 kg)   BMI 22.34 kg/m  , BMI Body mass index is 22.34 kg/m. GEN: Well nourished, well developed, female in no acute distress  HEENT: normal for age  Neck: no JVD, no carotid  bruit, no masses Cardiac: RRR; no murmur, no rubs, or gallops Respiratory:  clear to auscultation bilaterally, normal work of breathing GI: soft, nontender, nondistended, + BS MS: no deformity or atrophy; no edema; distal pulses are 2+ in all 4 extremities   Skin: warm and dry, no rash Neuro:  Strength and sensation are intact Psych: euthymic mood, full affect   EKG:  EKG is not ordered today.  Echo: 02/13/17: Study Conclusions - Left ventricle: The cavity size was normal. Wall thickness was increased in a pattern of mild LVH. Indeterminant diastolic function. Systolic function was normal. The estimated ejection fraction was in the range of 60% to 65%. Wall motion was normal; there were no regional wall motion abnormalities. - Aortic valve: There was no stenosis. - Mitral valve: There was trivial regurgitation. - Left atrium: The atrium was moderately dilated. - Right ventricle: The cavity size was normal. Systolic function was normal. - Right atrium: The atrium was moderately dilated. - Tricuspid valve: Peak RV-RA gradient (S): 69 mm Hg. - Pulmonary arteries: PA peak pressure: 72 mm Hg (S). - Inferior vena cava: The vessel was normal in size. The respirophasic diameter changes were in the normal range (>= 50%), consistent with normal central venous pressure. Impressions: - Normal LV size with mild LV hypertrophy. EF 60-65%. Normal RV size and systolic function. Moderate biatrial enlargement. Severe pulmonary hypertension.   Recent Labs: 04/08/2017: ALT 12; B Natriuretic Peptide 402.7; TSH 9.622 04/09/2017: BUN 6; Creatinine, Ser 0.64; Hemoglobin 13.0; Platelets 107; Potassium 3.8; Sodium 136    Lipid Panel No results found for: CHOL, TRIG, HDL, CHOLHDL, VLDL, LDLCALC, LDLDIRECT   Wt Readings from Last 3 Encounters:  04/18/17 132 lb 3.2 oz (60 kg)  04/09/17 130 lb 8.2 oz (59.2 kg)  04/03/17 134 lb (60.8 kg)     Other studies Reviewed: Additional  studies/ records that were reviewed today include: office notes, hospital records and testing.  ASSESSMENT AND PLAN:  1.  Symptomatic bradycardia with Second-degree AV block Mobitz 2: s/p St. Jude pacemaker. I emphasized the need to keep her appointment with the device clinic next week and follow-up with Dr. Ladona Ridgel as scheduled. She is agreeable to this. They have the machine for home checks but it be 20.2 and sodium plugged. I encouraged them to take it with them when they go to the office and the staff there will show them how to use it.  2. Pulmonary hypertension: Her respiratory status is greatly improved and she is not volume overloaded on exam. She does not overuse salt or over drink liquids. Keep appointment with Dr. Duke Salvia in August. She may be appropriate for some of the newer medications including Adcirca. It is  unclear if this is related to the CREST syndrome or primary PAH.  3. Atrial flutter: Her rate was controlled in the past. Her symptoms have greatly improved since her device was placed. Follow-up as scheduled, continue Xarelto.  4. Hypertension: Her blood pressure is mildly above target, follow-up with Dr. Duke Salviaandolph, may need to increase the carvedilol.   Current medicines are reviewed at length with the patient today.  The patient does not have concerns regarding medicines.  The following changes have been made:  no change  Labs/ tests ordered today include:  No orders of the defined types were placed in this encounter.    Disposition:   FU with the device clinic, Dr. Ladona Ridgelaylor, and Dr. Duke Salviaandolph as scheduled  Signed, Leanna BattlesBarrett, Rhonda, PA-C  04/18/2017 2:52 PM    [facility] Phone: [phone](336) [phone]; Fax: [phone]  This note was written with the assistance of speech recognition software. Please excuse any transcriptional errors.

## 2017-04-18 NOTE — Patient Instructions (Signed)
Medication Instructions:  Your physician recommends that you continue on your current medications as directed. Please refer to the Current Medication list given to you today. If you need a refill on your cardiac medications before your next appointment, please call your pharmacy.  Follow-Up: KEEP FOLLOW UP APPOINTMENTS AS SCHEDULED: WOUND CHECK 04-24-17 @230  [facility] [facility] [address] [facility] 05-28-17 @240PM  HERE AT THE [facility]  Special Instructions: PLEASE BRING REMOTE BOX TO YOUR WOUND CHECK  Thank you for choosing [facility]!!

## 2017-04-24 ENCOUNTER — Ambulatory Visit (INDEPENDENT_AMBULATORY_CARE_PROVIDER_SITE_OTHER): Payer: Medicare Other | Admitting: *Deleted

## 2017-04-24 DIAGNOSIS — I480 Paroxysmal atrial fibrillation: Secondary | ICD-10-CM

## 2017-04-24 NOTE — Progress Notes (Signed)
Wound check appointment. Steri-strips removed. Wound without redness or edema. Stitch present at medial incision. Stitch clipped, steri strips applied. Normal device function. Thresholds, sensing, and impedances consistent with implant measurements. Device programmed at 3.5V for extra safety margin until 3 month visit. Histogram distribution appropriate for patient and level of activity. 4 mode switches (46%) - EGMs show AF and Atach + xarelto. No high ventricular rates noted. Patient educated about wound care, arm mobility, lifting restrictions. Extensive remote monitoring education completed. Patient to call back for assistance in plugging him home monitor once they are at home. ROV for wound re-check 7/19 and with GT 9/25.

## 2017-04-25 ENCOUNTER — Telehealth: Payer: Self-pay

## 2017-04-29 NOTE — Telephone Encounter (Signed)
error 

## 2017-04-30 ENCOUNTER — Telehealth: Payer: Self-pay | Admitting: *Deleted

## 2017-04-30 NOTE — Telephone Encounter (Signed)
Mailed patient assistance papers for Xarelto to patient Spoke to husband and wife, requested they be mailed  Advised to complete patient portion return to me and would fax

## 2017-05-01 ENCOUNTER — Telehealth: Payer: Self-pay | Admitting: Internal Medicine

## 2017-05-01 ENCOUNTER — Ambulatory Visit (INDEPENDENT_AMBULATORY_CARE_PROVIDER_SITE_OTHER): Payer: Self-pay | Admitting: *Deleted

## 2017-05-01 DIAGNOSIS — R001 Bradycardia, unspecified: Secondary | ICD-10-CM

## 2017-05-01 DIAGNOSIS — Z950 Presence of cardiac pacemaker: Secondary | ICD-10-CM

## 2017-05-01 NOTE — Progress Notes (Signed)
Wound recheck in clinic. Steri-strips removed. Wound without redness or edema. Incision edges fully approximated and wound well healed. Patient and husband verbalize understanding of instructions to monitor for any signs/symptoms of infection. Device not interrogated today. ROV with Dr. Ladona Ridgelaylor on 07/08/17 as scheduled.  Xarelto samples given to patient per her request (see phone note).

## 2017-05-01 NOTE — Telephone Encounter (Signed)
Assistant program papers were mailed out to the pt by Juliette AlcideMelinda, Charity fundraiserN and pt stated that she has not gotten them yet. I gave pt 2 bottles of Xarelto 20 mg tablet. Pt had an appt with Device Clinic and they came to get samples for the pt.

## 2017-05-28 ENCOUNTER — Encounter: Payer: Self-pay | Admitting: Cardiovascular Disease

## 2017-05-28 ENCOUNTER — Ambulatory Visit (INDEPENDENT_AMBULATORY_CARE_PROVIDER_SITE_OTHER): Payer: Medicare Other | Admitting: Cardiovascular Disease

## 2017-05-28 VITALS — BP 124/69 | HR 69 | Ht 64.5 in | Wt 134.4 lb

## 2017-05-28 DIAGNOSIS — I272 Pulmonary hypertension, unspecified: Secondary | ICD-10-CM

## 2017-05-28 DIAGNOSIS — I480 Paroxysmal atrial fibrillation: Secondary | ICD-10-CM | POA: Diagnosis not present

## 2017-05-28 DIAGNOSIS — Z950 Presence of cardiac pacemaker: Secondary | ICD-10-CM | POA: Diagnosis not present

## 2017-05-28 DIAGNOSIS — R001 Bradycardia, unspecified: Secondary | ICD-10-CM

## 2017-05-28 NOTE — Progress Notes (Signed)
Cardiology Office Note   Date:  05/28/2017   ID:  Crystal PlumbLouise B Way, DOB 07/07/1929, MRN [id number]  PCP:  Merri BrunettePharr, Walter, MD  Cardiologist:   Chilton Siiffany Union, MD     History of Present Illness: Crystal Espinoza is a 83 y.o. female with RBBB, symptomatic bradycardia s/p PPM, scleroderma and atrial flutter here for follow up.  She was initially seen 01/2017 for the evaluation of atrial flutter.  Crystal Espinoza saw Dr. Renne CriglerPharr on 01/30/17 and was incidentally noted to be in atrial flutter. Her ventricular rate was 57 bpm. She was started on Eliquis and referred to cardiology for further evaluation.  She was asymptomatic other than occasional fluttering.  She was referred for an echo 02/2017 that revealed LVEF 60-65%. There was moderate left and right atrial enlargement.  She had severe pulmonary hypertension with PAS P 72 mmHg.  After that appointment she underwent EGD on 03/2017 and was hypertensive to 230/130.  THe procedure was cancelled ans she was started on amlodipine.  She was seen in the ED 03/2017 with symptomatic bradycardia.  She had 2:1 AV conduction.  She had a St. Jude dual chamber pacemaker implanted.  Her blood pressure remained poorly-controlled so amlodipine was increased to 10 mg and she was started on carvedilol. She followed up with Theodore Demarkhonda Barrett 04/2017 and was doing well.  She continues to do well.  Her breathing has been much better.  She denies chest pain, shortness of breath, orthopnea or PND.  She didn't sleep well last night but she attributes that to knee pain.  Tramadol helped.  She is struggling with the cost of Xarelto.  She never had many medications before this year, so she didn't purchase Medicare part D.    Past Medical History:  Diagnosis Date  . Cataract   . CREST syndrome (HCC)   . Hypertension   . Nausea   . Paroxysmal atrial fibrillation (HCC) 02/02/2017  . Severe pulmonary hypertension (HCC)    on cath 02/2017  . Symptomatic bradycardia 04/08/2017    Past  Surgical History:  Procedure Laterality Date  . ABDOMINAL HYSTERECTOMY    . EYE SURGERY    . PACEMAKER IMPLANT N/A 04/08/2017   Procedure: Pacemaker Implant;  Surgeon: Marinus Mawaylor, Gregg W, MD;  [facility];  Service: Cardiovascular;  Laterality: N/A;     Current Outpatient Prescriptions  Medication Sig Dispense Refill  . acetaminophen (TYLENOL) 325 MG tablet Take 650 mg by mouth daily.    Marland Kitchen. amLODipine (NORVASC) 10 MG tablet Take 1 tablet (10 mg total) by mouth daily. 30 tablet 6  . calcium citrate-vitamin D (CITRACAL+D) 315-200 MG-UNIT per tablet Take 1 tablet by mouth 2 (two) times daily.    . carvedilol (COREG) 3.125 MG tablet Take 1 tablet (3.125 mg total) by mouth 2 (two) times daily. 60 tablet 6  . Glucosamine 500 MG CAPS Take 2 capsules by mouth daily.    Marland Kitchen. levothyroxine (SYNTHROID) 100 MCG tablet Take 1 tablet by mouth daily.    . Multiple Vitamin (MULTIVITAMIN) tablet Take 1 tablet by mouth daily.    . rivaroxaban (XARELTO) 20 MG TABS tablet Take 1 tablet (20 mg total) by mouth daily with supper. 30 tablet 5  . simvastatin (ZOCOR) 40 MG tablet Take 1 tablet by mouth daily.    . traMADol (ULTRAM) 50 MG tablet Take 50 mg by mouth 2 (two) times daily.     No current facility-administered medications for this visit.  Allergies:   Patient has no known allergies.    Social History:  The patient  reports that she has never smoked. She has never used smokeless tobacco. She reports that she does not drink alcohol or use drugs.   Family History:  The patient's family history includes Cancer in her sister; Diabetes in her sister; Heart disease in her mother; Stroke in her father, sister, and sister.    ROS:  Please see the history of present illness.   Otherwise, review of systems are positive for none.   All other systems are reviewed and negative.    PHYSICAL EXAM: VS:  BP 124/69   Pulse 69   Ht 5' 4.5" (1.638 m)   Wt 61 kg (134 lb 6.4 oz)   BMI 22.71 kg/m  ,  BMI Body mass index is 22.71 kg/m. GENERAL:  Well appearing.  No acute distress HEENT:  Pupils equal round and reactive, fundi not visualized, oral mucosa unremarkable NECK:  No jugular venous distention, waveform within normal limits, carotid upstroke brisk and symmetric, no bruits LUNGS:  Clear to auscultation bilaterally.  No crackles, wheezes or rhonchi.  HEART:  Irregularly irregular.  PMI not displaced or sustained,S1 and S2 within normal limits, no S3, no S4, no clicks, no rubs, no murmurs ABD:  Flat, positive bowel sounds normal in frequency in pitch, no bruits, no rebound, no guarding, no midline pulsatile mass, no hepatomegaly, no splenomegaly EXT:  2 plus pulses throughout, no edema, no clubbing.  R third digit cyanosis SKIN:  No rashes no nodules.  Tight skin on fingers.  NEURO:  Cranial nerves II through XII grossly intact, motor grossly intact throughout PSYCH:  Cognitively intact, oriented to person place and time    EKG:  EKG is ordered today. The ekg ordered today demonstrates atrial flutter. Ventricular rate 61 bpm. Variable AV conduction. Right bundle branch block. 01/30/17: Atrial flutter. Ventricular rate 63 bpm. Right bundle branch block.  Recent Labs: 04/08/2017: ALT 12; B Natriuretic Peptide 402.7; TSH 9.622 04/09/2017: BUN 6; Creatinine, Ser 0.64; Hemoglobin 13.0; Platelets 107; Potassium 3.8; Sodium 136   01/23/17: WBC 3.9, hemoglobin 13.1, hematocrit 40.3, platelets 127 Sodium 141, potassium 4.4, BUN 16, creatinine 0.8 AST 13, ALT 19 Total cholesterol 168, triglycerides 72, HDL 65, LDL 89   Lipid Panel No results found for: CHOL, TRIG, HDL, CHOLHDL, VLDL, LDLCALC, LDLDIRECT    Wt Readings from Last 3 Encounters:  05/28/17 61 kg (134 lb 6.4 oz)  04/18/17 60 kg (132 lb 3.2 oz)  04/09/17 59.2 kg (130 lb 8.2 oz)      ASSESSMENT AND PLAN:  # Paroxysmal atrial fibrillation/flutter: Crystal Espinoza remains in atrial fibrillation but her rate is controlled and  she is asymptomatic.  Continue Xarelto.  We will give samples and she will work on getting Part D in October.  Continue carvedilol.   # Hyperlipidemia: Continue simvastatin.  LDL 89 01/2017.   # Hypertension:  Blood pressure was initially elevated but was 124/62 on repeat. No changes. Continue carvedilol and amlodipine.  # Severe pulmonary hypertension: She is asymptomatic and euvolemic.  No changes for now.    # PPM: Managed by Dr. Ladona Ridgel.   Current medicines are reviewed at length with the patient today.  The patient does not have concerns regarding medicines.  The following changes have been made:  Switch Eliquis to Xarelto  Labs/ tests ordered today include:   No orders of the defined types were placed in this encounter.  Disposition:   FU with Edrei Norgaard C. Duke Salvia, MD, [facility] in 4 months.   This note was written with the assistance of speech recognition software.  Please excuse any transcriptional errors.  Signed, Noelle Hoogland C. Duke Salvia, MD, [facility]  05/28/2017 5:13 PM    [facility]

## 2017-05-28 NOTE — Patient Instructions (Signed)
Medication Instructions:  Your physician recommends that you continue on your current medications as directed. Please refer to the Current Medication list given to you today.  Labwork: none  Testing/Procedures: none  Follow-Up: Your physician recommends that you schedule a follow-up appointment in: 4 month ov   If you need a refill on your cardiac medications before your next appointment, please call your pharmacy.  

## 2017-06-09 ENCOUNTER — Institutional Professional Consult (permissible substitution): Payer: Medicare Other | Admitting: Internal Medicine

## 2017-06-27 ENCOUNTER — Encounter: Payer: Self-pay | Admitting: Internal Medicine

## 2017-07-08 ENCOUNTER — Encounter: Payer: Self-pay | Admitting: Internal Medicine

## 2017-07-08 ENCOUNTER — Ambulatory Visit (INDEPENDENT_AMBULATORY_CARE_PROVIDER_SITE_OTHER): Payer: Medicare Other | Admitting: Internal Medicine

## 2017-07-08 VITALS — BP 148/78 | HR 71 | Ht 64.5 in | Wt 135.4 lb

## 2017-07-08 DIAGNOSIS — R001 Bradycardia, unspecified: Secondary | ICD-10-CM

## 2017-07-08 LAB — CUP PACEART INCLINIC DEVICE CHECK
Battery Remaining Longevity: 99 mo
Brady Statistic RV Percent Paced: 99.99 %
Date Time Interrogation Session: 20180925110514
Implantable Lead Location: 753860
Implantable Lead Model: 1999
Lead Channel Pacing Threshold Amplitude: 0.5 V
Lead Channel Pacing Threshold Amplitude: 0.5 V
Lead Channel Pacing Threshold Pulse Width: 0.5 ms
Lead Channel Pacing Threshold Pulse Width: 0.5 ms
Lead Channel Sensing Intrinsic Amplitude: 3.5 mV
MDC IDC LEAD IMPLANT DT: 20180626
MDC IDC LEAD IMPLANT DT: 20180626
MDC IDC LEAD LOCATION: 753859
MDC IDC MSMT BATTERY VOLTAGE: 2.99 V
MDC IDC MSMT LEADCHNL RA IMPEDANCE VALUE: 450 Ohm
MDC IDC MSMT LEADCHNL RA PACING THRESHOLD PULSEWIDTH: 0.5 ms
MDC IDC MSMT LEADCHNL RV IMPEDANCE VALUE: 450 Ohm
MDC IDC MSMT LEADCHNL RV PACING THRESHOLD AMPLITUDE: 0.5 V
MDC IDC MSMT LEADCHNL RV PACING THRESHOLD AMPLITUDE: 0.5 V
MDC IDC MSMT LEADCHNL RV PACING THRESHOLD PULSEWIDTH: 0.5 ms
MDC IDC PG IMPLANT DT: 20180626
MDC IDC PG SERIAL: 8918641
MDC IDC SET LEADCHNL RA PACING AMPLITUDE: 2 V
MDC IDC SET LEADCHNL RV PACING AMPLITUDE: 2.5 V
MDC IDC SET LEADCHNL RV PACING PULSEWIDTH: 0.5 ms
MDC IDC SET LEADCHNL RV SENSING SENSITIVITY: 5 mV
MDC IDC STAT BRADY RA PERCENT PACED: 39 %

## 2017-07-08 NOTE — Patient Instructions (Signed)
Medication Instructions:  Your physician recommends that you continue on your current medications as directed. Please refer to the Current Medication list given to you today.   Labwork: None ordered   Testing/Procedures: None ordered   Follow-Up: Your physician wants you to follow-up in: 9 months with Dr Taylor You will receive a reminder letter in the mail two months in advance. If you don't receive a letter, please call our office to schedule the follow-up appointment.   Any Other Special Instructions Will Be Listed Below (If Applicable).     If you need a refill on your cardiac medications before your next appointment, please call your pharmacy.   

## 2017-07-08 NOTE — Progress Notes (Signed)
HPI Crystal Espinoza returns today for ongoing evaluation and management of her PPM and CHB. The patient was seen back in June with symptomatic 2:1 heart block and underwent pacemaker insertion. She also has paroxysmal atrial arrhythmias, but has maintained sinus rhythm fairly nicely since her pacemaker was placed. She denies palpitations and does not know when she goes out of rhythm. She has had no recurrent syncope or near syncopal episodes. She has chronic dyspnea on exertion. She has had no bleeding on oral anticoagulant therapy. No Known Allergies   Current Outpatient Prescriptions  Medication Sig Dispense Refill  . acetaminophen (TYLENOL) 325 MG tablet Take 650 mg by mouth daily.    Marland Kitchen amLODipine (NORVASC) 10 MG tablet Take 1 tablet (10 mg total) by mouth daily. 30 tablet 6  . calcium citrate-vitamin D (CITRACAL+D) 315-200 MG-UNIT per tablet Take 1 tablet by mouth 2 (two) times daily.    . carvedilol (COREG) 3.125 MG tablet Take 1 tablet (3.125 mg total) by mouth 2 (two) times daily. 60 tablet 6  . Glucosamine-MSM-Hyaluronic Acd (JOINT HEALTH PO) Take 2,500 mg by mouth daily.    Marland Kitchen levothyroxine (SYNTHROID) 100 MCG tablet Take 1 tablet by mouth daily.    . Multiple Vitamin (MULTIVITAMIN) tablet Take 1 tablet by mouth daily.    . rivaroxaban (XARELTO) 20 MG TABS tablet Take 1 tablet (20 mg total) by mouth daily with supper. 30 tablet 5  . simvastatin (ZOCOR) 40 MG tablet Take 1 tablet by mouth daily.    . traMADol (ULTRAM) 50 MG tablet Take 50 mg by mouth 2 (two) times daily.     No current facility-administered medications for this visit.      Past Medical History:  Diagnosis Date  . Cataract   . CREST syndrome (HCC)   . Hypertension   . Nausea   . Paroxysmal atrial fibrillation (HCC) 02/02/2017  . Severe pulmonary hypertension (HCC)    on cath 02/2017  . Symptomatic bradycardia 04/08/2017    ROS:   All systems reviewed and negative except as noted in the HPI.   Past  Surgical History:  Procedure Laterality Date  . ABDOMINAL HYSTERECTOMY    . EYE SURGERY    . PACEMAKER IMPLANT N/A 04/08/2017   Procedure: Pacemaker Implant;  Surgeon: Marinus Maw, MD;  Location: [facility] INVASIVE CV LAB;  Service: Cardiovascular;  Laterality: N/A;     Family History  Problem Relation Age of Onset  . Heart disease Mother   . Stroke Father   . Stroke Sister   . Stroke Sister   . Cancer Sister   . Diabetes Sister      Social History   Social History  . Marital status: Married    Spouse name: N/A  . Number of children: N/A  . Years of education: N/A   Occupational History  . Not on file.   Social History Main Topics  . Smoking status: Never Smoker  . Smokeless tobacco: Never Used  . Alcohol use No  . Drug use: No  . Sexual activity: No   Other Topics Concern  . Not on file   Social History Narrative  . No narrative on file     BP (!) 148/78   Pulse 71   Ht 5' 4.5" (1.638 m)   Wt 135 lb 6.4 oz (61.4 kg)   SpO2 99%   BMI 22.88 kg/m   Physical Exam:  Well appearing 83 year old woman, NAD HEENT: Unremarkable Neck:  6  cm JVD, no thyromegally Lymphatics:  No adenopathy Back:  No CVA tenderness Lungs:  Clear, with no wheezes, rales, or rhonchi. HEART:  Regular rate rhythm, no murmurs, no rubs, no clicks Abd:  soft, positive bowel sounds, no organomegally, no rebound, no guarding Ext:  2 plus pulses, no edema, no cyanosis, no clubbing Skin:  No rashes no nodules Neuro:  CN II through XII intact, motor grossly intact  EKG - sinus rhythm with ventricular pacing  DEVICE  Normal device function.    Assess/Plan: 1. Symptomatic high-grade heart block - her conduction system has progressed. Today she has complete heart block and is pacing at 30 bpm. She is asymptomatic. 2. Pacemaker - her St. Jude dual-chamber pacemaker is working normally. We'll recheck in several months. 3. Paroxysmal atrial fibrillation and flutter - she is asymptomatic. She  will continue systemic anticoagulation.  Lewayne Bunting, M.D.

## 2017-07-30 ENCOUNTER — Telehealth: Payer: Self-pay | Admitting: *Deleted

## 2017-07-30 NOTE — Telephone Encounter (Signed)
Patient walked in regarding Xarelto Unsure what patient needed Harrold Donathathan RN left message to call back

## 2017-07-31 NOTE — Telephone Encounter (Signed)
Xarelto 20 mg # 3 given to patient, she walked in  Lot # [id number] exp 3/21

## 2017-09-23 ENCOUNTER — Ambulatory Visit: Payer: Medicare Other | Admitting: Cardiovascular Disease

## 2017-09-29 ENCOUNTER — Encounter: Payer: Self-pay | Admitting: Cardiovascular Disease

## 2017-09-29 ENCOUNTER — Ambulatory Visit (INDEPENDENT_AMBULATORY_CARE_PROVIDER_SITE_OTHER): Payer: Medicare Other | Admitting: Cardiovascular Disease

## 2017-09-29 VITALS — BP 151/77 | HR 70 | Ht 64.0 in | Wt 138.6 lb

## 2017-09-29 DIAGNOSIS — I480 Paroxysmal atrial fibrillation: Secondary | ICD-10-CM

## 2017-09-29 DIAGNOSIS — E7800 Pure hypercholesterolemia, unspecified: Secondary | ICD-10-CM | POA: Diagnosis not present

## 2017-09-29 DIAGNOSIS — I4892 Unspecified atrial flutter: Secondary | ICD-10-CM | POA: Diagnosis not present

## 2017-09-29 DIAGNOSIS — Z950 Presence of cardiac pacemaker: Secondary | ICD-10-CM

## 2017-09-29 DIAGNOSIS — Z23 Encounter for immunization: Secondary | ICD-10-CM | POA: Diagnosis not present

## 2017-09-29 DIAGNOSIS — I10 Essential (primary) hypertension: Secondary | ICD-10-CM | POA: Diagnosis not present

## 2017-09-29 MED ORDER — AMLODIPINE BESYLATE 5 MG PO TABS: 5.0000 mg | ORAL_TABLET | Freq: Every day | ORAL | 5 refills | Status: DC

## 2017-09-29 MED ORDER — CARVEDILOL 6.25 MG PO TABS: 6.2500 mg | ORAL_TABLET | Freq: Two times a day (BID) | ORAL | 6 refills | Status: DC

## 2017-09-29 NOTE — Patient Instructions (Addendum)
Medication Instructions:  DECREASE YOUR AMLODIPINE TO 5 MG DAILY  INCREASE YOUR CARVEDILOL 6.25 MG TWICE A DAY   Labwork: NONE  Testing/Procedures: NONE  Follow-Up: Your physician recommends that you schedule a follow-up appointment in: 1 MONTH OV   YOU HAVE BEEN GIVEN THE FLU VACCINE TODAY

## 2017-09-29 NOTE — Progress Notes (Signed)
Cardiology Office Note   Date:  09/29/2017   ID:  Crystal Espinoza, DOB 04/16/1929, MRN [id number]  PCP:  Merri BrunettePharr, Walter, MD  Cardiologist:   Chilton Siiffany [place], MD     History of Present Illness: Crystal Espinoza is a 83 y.o. female with RBBB, symptomatic bradycardia s/p PPM, scleroderma and atrial flutter here for follow up.  She was initially seen 01/2017 for the evaluation of atrial flutter.  Ms. Crystal Espinoza saw Dr. Renne CriglerPharr on 01/30/17 and was incidentally noted to be in atrial flutter. Her ventricular rate was 57 bpm. She was started on Eliquis and referred to cardiology for further evaluation.  She was asymptomatic other than occasional fluttering.  She was referred for an echo 02/2017 that revealed LVEF 60-65%. There was moderate left and right atrial enlargement.  She had severe pulmonary hypertension with PASP 72 mmHg.  After that appointment she underwent EGD on 03/2017 and was hypertensive to 230/130.  THe procedure was cancelled ans she was started on amlodipine.  She was seen in the ED 03/2017 with symptomatic bradycardia.  She had 2:1 AV conduction.  She had a St. Jude dual chamber pacemaker implanted.  Her blood pressure remained poorly-controlled so amlodipine was increased to 10 mg and she was started on carvedilol.  Since her last appointment Ms. Crystal Espinoza has been feeling mostly well.  After seeing Dr. Ladona Ridgelaylor amlodipine was increased to 10 mg daily.  Since then she has increased dizziness that is worse after she takes the dose.  She denies syncope or falls.  She feels as though her head is heavy.  She also notes increased muscle aches in her legs and arms since making that change.  She also notes pain in her knee worse when the weather changes.  She previously had surgery on her right knee.  She has not noted any lower extremity edema, orthopnea, or PND.  She gets exercise by walking and shopping.  She has no exertional symptoms.  She has been struggling to afford Xarelto.  She and her  husband are considering purchasing a Medicare part D plan or switching to warfarin.   Past Medical History:  Diagnosis Date  . Cataract   . CREST syndrome (HCC)   . Hypertension   . Nausea   . Paroxysmal atrial fibrillation (HCC) 02/02/2017  . Severe pulmonary hypertension (HCC)    on cath 02/2017  . Symptomatic bradycardia 04/08/2017    Past Surgical History:  Procedure Laterality Date  . ABDOMINAL HYSTERECTOMY    . EYE SURGERY    . PACEMAKER IMPLANT N/A 04/08/2017   Procedure: Pacemaker Implant;  Surgeon: Marinus Mawaylor, Gregg W, MD;  [facility];  Service: Cardiovascular;  Laterality: N/A;     Current Outpatient Medications  Medication Sig Dispense Refill  . acetaminophen (TYLENOL) 325 MG tablet Take 650 mg by mouth daily.    Marland Kitchen. amLODipine (NORVASC) 5 MG tablet Take 1 tablet (5 mg total) by mouth daily. 30 tablet 5  . calcium citrate-vitamin D (CITRACAL+D) 315-200 MG-UNIT per tablet Take 1 tablet by mouth 2 (two) times daily.    . carvedilol (COREG) 6.25 MG tablet Take 1 tablet (6.25 mg total) by mouth 2 (two) times daily. 60 tablet 6  . Glucosamine-MSM-Hyaluronic Acd (JOINT HEALTH PO) Take 2,500 mg by mouth daily.    Marland Kitchen. levothyroxine (SYNTHROID) 100 MCG tablet Take 1 tablet by mouth daily.    . Multiple Vitamin (MULTIVITAMIN) tablet Take 1 tablet by mouth daily.    .Marland Kitchen  rivaroxaban (XARELTO) 20 MG TABS tablet Take 1 tablet (20 mg total) by mouth daily with supper. 30 tablet 5  . simvastatin (ZOCOR) 40 MG tablet Take 1 tablet by mouth daily.    . traMADol (ULTRAM) 50 MG tablet Take 50 mg by mouth 2 (two) times daily.     No current facility-administered medications for this visit.     Allergies:   Patient has no known allergies.    Social History:  The patient  reports that  has never smoked. she has never used smokeless tobacco. She reports that she does not drink alcohol or use drugs.   Family History:  The patient's family history includes Cancer in her sister;  Diabetes in her sister; Heart disease in her mother; Stroke in her father, sister, and sister.    ROS:  Please see the history of present illness.   Otherwise, review of systems are positive for none.   All other systems are reviewed and negative.    PHYSICAL EXAM: VS:  BP (!) 151/77   Pulse 70   Ht 5\' 4"  (1.626 m)   Wt 138 lb 9.6 oz (62.9 kg)   BMI 23.79 kg/m  , BMI Body mass index is 23.79 kg/m. GENERAL:  Well appearing HEENT: Pupils equal round and reactive, fundi not visualized, oral mucosa unremarkable NECK:  No jugular venous distention, waveform within normal limits, carotid upstroke brisk and symmetric, no bruits, no thyromegaly LYMPHATICS:  No cervical adenopathy LUNGS:  Clear to auscultation bilaterally HEART:  RRR.  PMI not displaced or sustained,S1 and S2 within normal limits, no S3, no S4, no clicks, no rubs, no murmurs ABD:  Flat, positive bowel sounds normal in frequency in pitch, no bruits, no rebound, no guarding, no midline pulsatile mass, no hepatomegaly, no splenomegaly EXT:  2 plus pulses throughout, no edema, no cyanosis no clubbing SKIN:  No rashes no nodules NEURO:  Cranial nerves II through XII grossly intact, motor grossly intact throughout PSYCH:  Cognitively intact, oriented to person place and time    EKG:  EKG is not ordered today. The ekg ordered today demonstrates atrial flutter. Ventricular rate 61 bpm. Variable AV conduction. Right bundle branch block. 01/30/17: Atrial flutter. Ventricular rate 63 bpm. Right bundle branch block.  Recent Labs: 04/08/2017: ALT 12; B Natriuretic Peptide 402.7; TSH 9.622 04/09/2017: BUN 6; Creatinine, Ser 0.64; Hemoglobin 13.0; Platelets 107; Potassium 3.8; Sodium 136   01/23/17: WBC 3.9, hemoglobin 13.1, hematocrit 40.3, platelets 127 Sodium 141, potassium 4.4, BUN 16, creatinine 0.8 AST 13, ALT 19 Total cholesterol 168, triglycerides 72, HDL 65, LDL 89   Lipid Panel No results found for: CHOL, TRIG, HDL,  CHOLHDL, VLDL, LDLCALC, LDLDIRECT    Wt Readings from Last 3 Encounters:  09/29/17 138 lb 9.6 oz (62.9 kg)  07/08/17 135 lb 6.4 oz (61.4 kg)  05/28/17 134 lb 6.4 oz (61 kg)      ASSESSMENT AND PLAN:  # Paroxysmal atrial fibrillation/flutter: Ms. Hunkins is currently in sinus rhythm.  We will give her samples of Xarelto today and she will contemplate whether she wants to get Medicare part D.,  She will need to switch to warfarin as Xarelto has been cost prohibitive.  Increase carvedilol to 6.25 mg twice daily.   # Hyperlipidemia: Continue simvastatin.  LDL 89 01/2017.  If she continues to have muscle aches after reducing amlodipine we will likely need to reduce her simvastatin or switch to a different agent.  # Hypertension:  Blood pressure is elevated.  She has not tolerated higher doses of amlodipine.  We will reduce this back to 5 mg daily and increase carvedilol to 6.25 mg twice daily.   # Severe pulmonary hypertension: She is asymptomatic and euvolemic.  No changes for now.    # PPM: Managed by Dr. Ladona Ridgelaylor.   Current medicines are reviewed at length with the patient today.  The patient does not have concerns regarding medicines.  The following changes have been made: Reduce amlodipine to 5 mg.  Increase carvedilol to 6.25 mg.  Labs/ tests ordered today include:   Orders Placed This Encounter  Procedures  . Flu Vaccine QUAD 36+ mos IM  . Flu Vaccine QUAD 36+ mos IM     Disposition:   FU with Teanna Elem C. Duke Salviaandolph, MD, [facility] in 1 month.   This note was written with the assistance of speech recognition software.  Please excuse any transcriptional errors.  Signed, Yesli Vanderhoff C. Duke Salviaandolph, MD, [facility]  09/29/2017 4:13 PM    [facility]

## 2017-09-30 ENCOUNTER — Other Ambulatory Visit: Payer: Self-pay | Admitting: Cardiovascular Disease

## 2017-09-30 MED ORDER — RIVAROXABAN 20 MG PO TABS: 20.0000 mg | ORAL_TABLET | Freq: Every day | ORAL | 3 refills | Status: DC

## 2017-09-30 NOTE — Telephone Encounter (Signed)
°*  STAT* If patient is at the pharmacy, call can be transferred to refill team.   1. Which medications need to be refilled? (please list name of each medication and dose if known) Xarelto  2. Which pharmacy/location (including street and city if local pharmacy) is medication to be sent to?[facility][phone]  3. Do they need a 30 day or 90 day supply? 30 and refills

## 2017-10-08 ENCOUNTER — Encounter: Payer: Medicare Other | Admitting: *Deleted

## 2017-10-08 ENCOUNTER — Telehealth: Payer: Self-pay | Admitting: Cardiology

## 2017-10-08 NOTE — Telephone Encounter (Signed)
LMOVM reminding pt to send remote transmission.   

## 2017-10-09 ENCOUNTER — Encounter: Payer: Self-pay | Admitting: Cardiology

## 2017-10-09 NOTE — Progress Notes (Signed)
Letter  

## 2017-10-15 ENCOUNTER — Ambulatory Visit (INDEPENDENT_AMBULATORY_CARE_PROVIDER_SITE_OTHER): Payer: Medicare Other | Admitting: *Deleted

## 2017-10-15 DIAGNOSIS — R001 Bradycardia, unspecified: Secondary | ICD-10-CM

## 2017-10-15 DIAGNOSIS — I480 Paroxysmal atrial fibrillation: Secondary | ICD-10-CM

## 2017-10-30 NOTE — Progress Notes (Signed)
Remote pacemaker check. 

## 2017-10-31 LAB — CUP PACEART REMOTE DEVICE CHECK
Battery Remaining Longevity: 98 mo
Battery Remaining Percentage: 95.5 %
Battery Voltage: 3.01 V
Brady Statistic AS VS Percent: 1 %
Brady Statistic RA Percent Paced: 61 %
Implantable Lead Implant Date: 20180626
Implantable Lead Implant Date: 20180626
Implantable Lead Location: 753860
Implantable Lead Model: 1999
Implantable Pulse Generator Implant Date: 20180626
Lead Channel Impedance Value: 450 Ohm
Lead Channel Pacing Threshold Amplitude: 0.5 V
Lead Channel Pacing Threshold Pulse Width: 0.5 ms
Lead Channel Pacing Threshold Pulse Width: 0.5 ms
Lead Channel Sensing Intrinsic Amplitude: 2.3 mV
MDC IDC LEAD LOCATION: 753859
MDC IDC MSMT LEADCHNL RA IMPEDANCE VALUE: 460 Ohm
MDC IDC MSMT LEADCHNL RV PACING THRESHOLD AMPLITUDE: 0.5 V
MDC IDC MSMT LEADCHNL RV SENSING INTR AMPL: 7.6 mV
MDC IDC PG SERIAL: 8918641
MDC IDC SESS DTM: 20190101192106
MDC IDC SET LEADCHNL RA PACING AMPLITUDE: 2 V
MDC IDC SET LEADCHNL RV PACING AMPLITUDE: 2.5 V
MDC IDC SET LEADCHNL RV PACING PULSEWIDTH: 0.5 ms
MDC IDC SET LEADCHNL RV SENSING SENSITIVITY: 5 mV
MDC IDC STAT BRADY AP VP PERCENT: 61 %
MDC IDC STAT BRADY AP VS PERCENT: 1 %
MDC IDC STAT BRADY AS VP PERCENT: 39 %
MDC IDC STAT BRADY RV PERCENT PACED: 99 %

## 2017-11-10 ENCOUNTER — Encounter: Payer: Self-pay | Admitting: Physician Assistant

## 2017-11-10 ENCOUNTER — Ambulatory Visit (INDEPENDENT_AMBULATORY_CARE_PROVIDER_SITE_OTHER): Payer: Medicare Other | Admitting: Physician Assistant

## 2017-11-10 VITALS — BP 150/65 | HR 65 | Ht 64.0 in | Wt 141.0 lb

## 2017-11-10 DIAGNOSIS — E7800 Pure hypercholesterolemia, unspecified: Secondary | ICD-10-CM

## 2017-11-10 DIAGNOSIS — I10 Essential (primary) hypertension: Secondary | ICD-10-CM

## 2017-11-10 DIAGNOSIS — Z950 Presence of cardiac pacemaker: Secondary | ICD-10-CM | POA: Diagnosis not present

## 2017-11-10 DIAGNOSIS — I480 Paroxysmal atrial fibrillation: Secondary | ICD-10-CM

## 2017-11-10 NOTE — Progress Notes (Signed)
Cardiology Office Note   Date:  11/10/2017   ID:  Crystal Espinoza, DOB 01-13-1929, MRN [id number]  PCP:  Merri Brunette, MD  Cardiologist:  Dr. Duke Salvia   Chief Complaint  Patient presents with  . Follow-up    pt denied chest pain and SOB    History of Present Illness: Crystal Espinoza is a 84 y.o. female with a history of RBBB, symptomatic bradycardia s/p PPM, scleroderma, atrial flutter, and severe pulmonary HTN.   Seen by Dr. Renne Crigler on 01/30/2017 and was incidentally found to be in atrial flutter at that time with ventricular rate of 57bpm. Started on Eiliquis and referred to cardiology. Echo 02/13/2017 showed mild LVH with EF of 60-65%, moderate left and right atrial enlargement, and severe pulmonary HTN with PAS of .   Seen in ED in 03/2017 for symptomatic bradycardia with 2:1 AV conduction. Had St. Jude dual chamber pacemaker placed. No syncope or falls. No exertional symptoms.   Last office visit 09/29/2017. Increased dizziness after increasing Amlodipine to 10mg .Struggling to afford Xarelto and is considering switching to Coumadin. Amlodopine was reduced from 10mg  to 5mg  and Carvedilol 6.25mg  BID was added.   Crystal Espinoza presents today for follow up.  She feels like her PPM is doing well. Tolerating increased dose of Coreg. Compliant w/ PPM checks, A pacing 61% of the time, V pacing 99% of the time.   Feels her BP is higher at MD's offices. SBP normally 130s at home. DBP 60s-70s at home.   She walks without getting CP or SOB, but does not exercise regularly. She had a small cold recently, but is not sick now.   Admits that she could increase her activity. She does not do stairs.  She does not get palpitations. She gets a little light-headed when she stands up, this passes in a few seconds. She does not feel in danger of falling or passing out. I explained that we could change her BP meds to try to reduce that, but she might get other side effects, she says she  is tolerating this well.  The pain in her legs has gotten better on the reduced dose of amlodipine. I explained that we could change her Zocor to another drug, but she does not wish to do that.   She is not having any bleeding issues on the Xarelto, wonders if we have samples.    Past Medical History:  Diagnosis Date  . Cataract   . CREST syndrome (HCC)   . Hypertension   . Nausea   . Paroxysmal atrial fibrillation (HCC) 02/02/2017  . Severe pulmonary hypertension (HCC)    on cath 02/2017  . Symptomatic bradycardia 04/08/2017    Past Surgical History:  Procedure Laterality Date  . ABDOMINAL HYSTERECTOMY    . EYE SURGERY    . PACEMAKER IMPLANT N/A 04/08/2017   Procedure: Pacemaker Implant;  Surgeon: Marinus Maw, MD;  Location: [facility] INVASIVE CV LAB;  Service: Cardiovascular;  Laterality: N/A;    Current Outpatient Medications  Medication Sig Dispense Refill  . acetaminophen (TYLENOL) 325 MG tablet Take 650 mg by mouth daily.    Marland Kitchen amLODipine (NORVASC) 5 MG tablet Take 1 tablet (5 mg total) by mouth daily. 30 tablet 5  . calcium citrate-vitamin D (CITRACAL+D) 315-200 MG-UNIT per tablet Take 1 tablet by mouth 2 (two) times daily.    . carvedilol (COREG) 6.25 MG tablet Take 1 tablet (6.25 mg total) by mouth 2 (two) times daily. 60 tablet  6  . Glucosamine-MSM-Hyaluronic Acd (JOINT HEALTH PO) Take 2,500 mg by mouth daily.    Marland Kitchen. levothyroxine (SYNTHROID) 100 MCG tablet Take 1 tablet by mouth daily.    . Multiple Vitamin (MULTIVITAMIN) tablet Take 1 tablet by mouth daily.    . rivaroxaban (XARELTO) 20 MG TABS tablet Take 1 tablet (20 mg total) by mouth daily with supper. 30 tablet 3  . simvastatin (ZOCOR) 40 MG tablet Take 1 tablet by mouth daily.    . traMADol (ULTRAM) 50 MG tablet Take 50 mg by mouth 2 (two) times daily.     No current facility-administered medications for this visit.     Allergies:   Patient has no known allergies.    Social History:  The patient  reports that   has never smoked. she has never used smokeless tobacco. She reports that she does not drink alcohol or use drugs.   Family History:  The patient's family history includes Cancer in her sister; Diabetes in her sister; Heart disease in her mother; Stroke in her father, sister, and sister.    ROS:  Please see the history of present illness. All other systems are reviewed and negative.    PHYSICAL EXAM: VS:  BP (!) 150/65   Pulse 65   Ht 5\' 4"  (1.626 m)   Wt 141 lb (64 kg)   BMI 24.20 kg/m  , BMI Body mass index is 24.2 kg/m. GEN: Well nourished, well developed, female in no acute distress  HEENT: normal for age  Neck: no JVD, no carotid bruit, no masses Cardiac: RRR; no murmur, no rubs, or gallops Respiratory: Generally clear to auscultation bilaterally, but has a few rales right base, normal work of breathing GI: soft, nontender, nondistended, + BS MS: no deformity or atrophy; no edema; distal pulses are 2+ in all 4 extremities   Skin: warm and dry, no rash Neuro:  Strength and sensation are intact Psych: euthymic mood, full affect   EKG:  EKG is not ordered today.  ECHO 02/13/2017: Study Conclusions: - Left ventricle: The cavity size was normal. Wall thickness was   increased in a pattern of mild LVH. Indeterminant diastolic   function. Systolic function was normal. The estimated ejection   fraction was in the range of 60% to 65%. Wall motion was normal;   there were no regional wall motion abnormalities. - Aortic valve: There was no stenosis. - Mitral valve: There was trivial regurgitation. - Left atrium: The atrium was moderately dilated. - Right ventricle: The cavity size was normal. Systolic function   was normal. - Right atrium: The atrium was moderately dilated. - Tricuspid valve: Peak RV-RA gradient (S): 69 mm Hg. - Pulmonary arteries: PA peak pressure: 72 mm Hg (S). - Inferior vena cava: The vessel was normal in size. The   respirophasic diameter changes were in the  normal range (>= 50%),   consistent with normal central venous pressure. Impressions: - Normal LV size with mild LV hypertrophy. EF 60-65%. Normal RV   size and systolic function. Moderate biatrial enlargement. Severe   pulmonary hypertension.  Recent Labs: 04/08/2017: ALT 12; B Natriuretic Peptide 402.7; TSH 9.622 04/09/2017: BUN 6; Creatinine, Ser 0.64; Hemoglobin 13.0; Platelets 107; Potassium 3.8; Sodium 136    Lipid Panel No results found for: CHOL, TRIG, HDL, CHOLHDL, VLDL, LDLCALC, LDLDIRECT   Wt Readings from Last 3 Encounters:  11/10/17 141 lb (64 kg)  09/29/17 138 lb 9.6 oz (62.9 kg)  07/08/17 135 lb 6.4 oz (61.4 kg)  Other studies Reviewed: Additional studies/ records that were reviewed today include: Office notes, hospital records and testing.  ASSESSMENT AND PLAN:  1.  PAF: Her heart rate is regular today, she is in sinus rhythm.  She is tolerating the increased dose of carvedilol.  2.  Chronic anticoagulation: She has trouble affording the Xarelto, but does wish to try to stay on it.  She was given a 2-week supply of samples.  3.  Severe pulmonary hypertension: Her weight is up minimally, but her symptoms are unchanged.  4.  PPM: She is to follow-up with Dr. Ladona Ridgel in June, go ahead and make this appointment now if possible.  5.  Hypertension: We discussed medication options.  She is not very lightheaded now and the pain in her legs is decreased.  She is concerned about side effects from other medications if we change the amlodipine, no med changes for now.  She feels her blood pressure at home generally runs in the 130s.  This is acceptable.  She is to record her blood pressure at different times of the day and let us know if she gets readings consistently above 135.  6.  Hyperlipidemia: I explained that Dr. Duke Salvia had offered to change the Zocor to another statin if she wished to see if this would help the pain in her legs.  She is concerned about side effects  from another statin, wishes to continue the same one for now.   Current medicines are reviewed at length with the patient today.  The patient has concerns regarding medicines. Concerns were addressed The following changes have been made:  no change  Labs/ tests ordered today include:  No orders of the defined types were placed in this encounter.    Disposition:   FU with Dr. Duke Salvia in 6 months  Signed, Marjie Skiff, student PA Theodore Demark, PA-C  11/10/2017 5:53 PM    [facility] Phone: [phone]; Fax: [phone]  This note was written with the assistance of speech recognition software. Please excuse any transcriptional errors.

## 2017-11-10 NOTE — Patient Instructions (Signed)
Medication Instructions: Your physician recommends that you continue on your current medications as directed. Please refer to the Current Medication list given to you today.  Samples of Xarelto 20 mg given today   Follow-Up: Your physician recommends that you schedule a follow-up appointment with Dr. Ladona Ridgelaylor in June.  Your physician wants you to follow-up in: 6 months with Dr. Duke Salviaandolph. You will receive a reminder letter in the mail two months in advance. If you don't receive a letter, please call our office to schedule the follow-up appointment.  Any Other Special Instructions are listed below:  Please call if your lightheadedness or leg aches get worse and if your systolic BP (top number) stays above 135 consistently.   If you need a refill on your cardiac medications before your next appointment, please call your pharmacy.

## 2017-11-17 ENCOUNTER — Telehealth: Payer: Self-pay | Admitting: Physician Assistant

## 2017-11-17 NOTE — Telephone Encounter (Signed)
Returned the call to the patient. She stated that her blood pressure last night was 150/81 but today it was 138/70 and 136/61. She stated that it has been staying in the 130's systolic. Per Theodore Demarkhonda Barrett, PA these numbers are okay. She will not increase any meidciations at this time due to the risk of side effects. The patient will call us back if her blood pressure stays above 160 systolic.  Her dizziness has improved but she did state that her right hand was tingling at the finger tips. This started yesterday when she woke up and was lying on her right hand. It has improved today. She has been advised to keep wiggling her fingers to get the circulation moving. If it does not keep improving then she may call us back.

## 2017-11-17 NOTE — Telephone Encounter (Signed)
New message  Patient calling with BP concerns  Pt c/o BP issue:  1. What are your last 5 BP readings? 138/70, 150/81, 144/68, 137/74, 136/61 2. Are you having any other symptoms (ex. Dizziness, headache, blurred vision, passed out)? Dizziness, right hand numbness; in fingers only 3. What is your medication issue? Patient states medication may need adjustment

## 2017-12-12 ENCOUNTER — Telehealth: Payer: Self-pay

## 2017-12-12 NOTE — Telephone Encounter (Signed)
Spouse seeing Caryl AdaHoa Meng today and asked for Xarelto samples for his wife, he is her legal guardian and on her DPR form. Samples given to husband.

## 2017-12-17 MED ORDER — RIVAROXABAN 20 MG PO TABS: 20.0000 mg | ORAL_TABLET | Freq: Every day | ORAL | 0 refills | Status: DC

## 2018-01-08 ENCOUNTER — Telehealth: Payer: Self-pay | Admitting: Cardiovascular Disease

## 2018-01-08 NOTE — Telephone Encounter (Signed)
New Message:     Numbness and hurting in her hands.She says her back and leg are hurting,also legs feel weak.She think this might be coming from the Xarelto.Please call to advise.

## 2018-01-08 NOTE — Telephone Encounter (Signed)
Spoke with patient and she is concerned about the Xarelto causing side effects of tingling/numbness in her right hand and back pain/leg pain weakness in the morning when she gets up. She would like to try Eliquis since her son takes that without any problems. Patient started Xarelto in April 2018 and d/c Eliquis secondary to nausea and diarrhea. Discussed with Hermenia Bershonda B PA who saw patient in January. At that visit leg pain was discussed. Per Bjorn Loserhonda options are continue Xarelto, change to Eliquis (cost about the same), changing to Warfarin, or stopping altogether. Ok to hold Simvastatin for 2 weeks to see if symptoms improve. If they do call back for recommendation, if not resume and follow up with PCP. Advised patient and reviewed recommendations.  She does not want to start Warfarin secondary to all the things involved (INR's and food restrictions). She will continue current medications for now. Samples of Xarelto 20 #3 lot # [id number] EXP 2/21 at front desk for pick up/

## 2018-01-19 ENCOUNTER — Ambulatory Visit (INDEPENDENT_AMBULATORY_CARE_PROVIDER_SITE_OTHER): Payer: Medicare Other | Admitting: *Deleted

## 2018-01-19 DIAGNOSIS — R001 Bradycardia, unspecified: Secondary | ICD-10-CM

## 2018-01-20 NOTE — Progress Notes (Signed)
Remote pacemaker transmission.   

## 2018-01-22 ENCOUNTER — Encounter: Payer: Self-pay | Admitting: Cardiology

## 2018-02-11 LAB — CUP PACEART REMOTE DEVICE CHECK
Battery Voltage: 2.99 V
Brady Statistic AP VP Percent: 59 %
Brady Statistic AS VP Percent: 41 %
Brady Statistic RA Percent Paced: 41 %
Implantable Lead Implant Date: 20180626
Implantable Lead Location: 753859
Implantable Lead Model: 1999
Implantable Pulse Generator Implant Date: 20180626
Lead Channel Impedance Value: 480 Ohm
Lead Channel Pacing Threshold Amplitude: 0.5 V
Lead Channel Pacing Threshold Pulse Width: 0.5 ms
Lead Channel Sensing Intrinsic Amplitude: 1.5 mV
Lead Channel Setting Pacing Amplitude: 2.5 V
Lead Channel Setting Pacing Pulse Width: 0.5 ms
MDC IDC LEAD IMPLANT DT: 20180626
MDC IDC LEAD LOCATION: 753860
MDC IDC MSMT BATTERY REMAINING LONGEVITY: 97 mo
MDC IDC MSMT BATTERY REMAINING PERCENTAGE: 95.5 %
MDC IDC MSMT LEADCHNL RA PACING THRESHOLD AMPLITUDE: 0.5 V
MDC IDC MSMT LEADCHNL RA PACING THRESHOLD PULSEWIDTH: 0.5 ms
MDC IDC MSMT LEADCHNL RV IMPEDANCE VALUE: 450 Ohm
MDC IDC MSMT LEADCHNL RV SENSING INTR AMPL: 8.4 mV
MDC IDC SESS DTM: 20190408131629
MDC IDC SET LEADCHNL RA PACING AMPLITUDE: 2 V
MDC IDC SET LEADCHNL RV SENSING SENSITIVITY: 5 mV
MDC IDC STAT BRADY AP VS PERCENT: 1 %
MDC IDC STAT BRADY AS VS PERCENT: 1 %
MDC IDC STAT BRADY RV PERCENT PACED: 99 %
Pulse Gen Model: 2272
Pulse Gen Serial Number: 8918641

## 2018-02-17 ENCOUNTER — Telehealth: Payer: Self-pay | Admitting: *Deleted

## 2018-02-17 NOTE — Telephone Encounter (Signed)
Called patient in regards to alert for persistent AF since 11-2017. Patient states that she has chronic DOE, but denies any new or worsening sx's. I encouraged patient to continue taking xarelto as Rx'd and to call if sx's develop. Patient verbalized understanding and appreciation of call.  Patient also asked if there were any xarelto samples that she could pick up. I informed patient that samples are normally given out when Rx is just started, but I will forward information to refill department. Again, patient verbalized understanding.

## 2018-02-17 NOTE — Telephone Encounter (Signed)
This is Dr. Kingvale's pt.  °

## 2018-02-18 NOTE — Telephone Encounter (Signed)
Patient notified there are no xarelto  samples at [facility] office. Suggested she call later this week or early next week

## 2018-03-18 ENCOUNTER — Other Ambulatory Visit: Payer: Self-pay | Admitting: Cardiovascular Disease

## 2018-03-19 ENCOUNTER — Telehealth: Payer: Self-pay | Admitting: Cardiovascular Disease

## 2018-03-19 NOTE — Telephone Encounter (Signed)
Xarelto 20 mg # 3 lot # [id number] expires 4/21 at front desk for pick up, patient aware

## 2018-03-19 NOTE — Telephone Encounter (Signed)
New message    Patient calling the office for samples of medication:   1.  What medication and dosage are you requesting samples for? XARELTO 20 MG TABS tablet  2.  Are you currently out of this medication? no

## 2018-04-06 ENCOUNTER — Encounter: Payer: Medicare Other | Admitting: Internal Medicine

## 2018-04-20 ENCOUNTER — Ambulatory Visit (INDEPENDENT_AMBULATORY_CARE_PROVIDER_SITE_OTHER): Payer: Medicare Other | Admitting: *Deleted

## 2018-04-20 ENCOUNTER — Telehealth: Payer: Self-pay | Admitting: Cardiology

## 2018-04-20 DIAGNOSIS — R001 Bradycardia, unspecified: Secondary | ICD-10-CM

## 2018-04-20 NOTE — Telephone Encounter (Signed)
LMOVM reminding pt to send remote transmission.   

## 2018-04-21 NOTE — Progress Notes (Signed)
Remote pacemaker transmission.   

## 2018-04-26 LAB — CUP PACEART REMOTE DEVICE CHECK
Battery Remaining Percentage: 95.5 %
Brady Statistic AP VS Percent: 1 %
Brady Statistic AS VP Percent: 41 %
Brady Statistic AS VS Percent: 1 %
Brady Statistic RV Percent Paced: 99 %
Implantable Lead Implant Date: 20180626
Implantable Lead Location: 753859
Implantable Lead Location: 753860
Implantable Lead Model: 1999
Lead Channel Impedance Value: 490 Ohm
Lead Channel Pacing Threshold Amplitude: 0.5 V
Lead Channel Pacing Threshold Pulse Width: 0.5 ms
Lead Channel Sensing Intrinsic Amplitude: 0.5 mV
Lead Channel Setting Pacing Amplitude: 2.5 V
Lead Channel Setting Pacing Pulse Width: 0.5 ms
Lead Channel Setting Sensing Sensitivity: 5 mV
MDC IDC LEAD IMPLANT DT: 20180626
MDC IDC MSMT BATTERY REMAINING LONGEVITY: 103 mo
MDC IDC MSMT BATTERY VOLTAGE: 2.99 V
MDC IDC MSMT LEADCHNL RA PACING THRESHOLD AMPLITUDE: 0.5 V
MDC IDC MSMT LEADCHNL RA PACING THRESHOLD PULSEWIDTH: 0.5 ms
MDC IDC MSMT LEADCHNL RV IMPEDANCE VALUE: 450 Ohm
MDC IDC MSMT LEADCHNL RV SENSING INTR AMPL: 8.4 mV
MDC IDC PG IMPLANT DT: 20180626
MDC IDC PG SERIAL: 8918641
MDC IDC SESS DTM: 20190712035901
MDC IDC SET LEADCHNL RA PACING AMPLITUDE: 2 V
MDC IDC STAT BRADY AP VP PERCENT: 59 %
MDC IDC STAT BRADY RA PERCENT PACED: 31 %

## 2018-05-04 ENCOUNTER — Encounter: Payer: Self-pay | Admitting: Internal Medicine

## 2018-05-04 ENCOUNTER — Encounter (INDEPENDENT_AMBULATORY_CARE_PROVIDER_SITE_OTHER): Payer: Self-pay

## 2018-05-04 ENCOUNTER — Ambulatory Visit (INDEPENDENT_AMBULATORY_CARE_PROVIDER_SITE_OTHER): Payer: Medicare Other | Admitting: Internal Medicine

## 2018-05-04 VITALS — BP 128/60 | HR 70 | Ht 64.0 in | Wt 140.2 lb

## 2018-05-04 DIAGNOSIS — R001 Bradycardia, unspecified: Secondary | ICD-10-CM | POA: Diagnosis not present

## 2018-05-04 DIAGNOSIS — Z950 Presence of cardiac pacemaker: Secondary | ICD-10-CM | POA: Diagnosis not present

## 2018-05-04 DIAGNOSIS — I480 Paroxysmal atrial fibrillation: Secondary | ICD-10-CM | POA: Diagnosis not present

## 2018-05-04 LAB — CUP PACEART INCLINIC DEVICE CHECK
Battery Remaining Longevity: 104 mo
Date Time Interrogation Session: 20190722172547
Implantable Lead Implant Date: 20180626
Implantable Lead Implant Date: 20180626
Implantable Lead Location: 753860
Implantable Pulse Generator Implant Date: 20180626
Lead Channel Impedance Value: 462.5 Ohm
Lead Channel Pacing Threshold Amplitude: 0.5 V
Lead Channel Pacing Threshold Pulse Width: 0.5 ms
Lead Channel Sensing Intrinsic Amplitude: 1 mV
Lead Channel Setting Sensing Sensitivity: 5 mV
MDC IDC LEAD LOCATION: 753859
MDC IDC MSMT BATTERY VOLTAGE: 2.99 V
MDC IDC MSMT LEADCHNL RA IMPEDANCE VALUE: 475 Ohm
MDC IDC SET LEADCHNL RA PACING AMPLITUDE: 2 V
MDC IDC SET LEADCHNL RV PACING AMPLITUDE: 2.5 V
MDC IDC SET LEADCHNL RV PACING PULSEWIDTH: 0.5 ms
MDC IDC STAT BRADY RA PERCENT PACED: 31 %
MDC IDC STAT BRADY RV PERCENT PACED: 99.87 %
Pulse Gen Model: 2272
Pulse Gen Serial Number: 8918641

## 2018-05-04 MED ORDER — APIXABAN 5 MG PO TABS: 5.0000 mg | ORAL_TABLET | Freq: Two times a day (BID) | ORAL | 11 refills | Status: DC

## 2018-05-04 NOTE — Progress Notes (Signed)
HPI [name] returns today for ongoing evaluation and management of her PPM and CHB. The patient was seen a year ago with symptomatic 2:1 heart block and underwent pacemaker insertion. She also has paroxysmal atrial arrhythmias and has developed chronic atrial fib. Because she is asymptomatic, we have recommended rate rather than rhythm control. She has had no recurrent syncope or near syncopal episodes. She has chronic dyspnea on exertion. She has had no bleeding on oral anticoagulant therapy.  No Known Allergies   Current Outpatient Medications  Medication Sig Dispense Refill  . acetaminophen (TYLENOL) 325 MG tablet Take 650 mg by mouth daily.    Marland Kitchen. amLODipine (NORVASC) 5 MG tablet Take 1 tablet (5 mg total) by mouth daily. 30 tablet 5  . calcium citrate-vitamin D (CITRACAL+D) 315-200 MG-UNIT per tablet Take 1 tablet by mouth 2 (two) times daily.    . carvedilol (COREG) 6.25 MG tablet Take 1 tablet (6.25 mg total) by mouth 2 (two) times daily. 60 tablet 6  . Glucosamine-MSM-Hyaluronic Acd (JOINT HEALTH PO) Take 2,500 mg by mouth daily.    Marland Kitchen. levothyroxine (SYNTHROID) 100 MCG tablet Take 1 tablet by mouth daily.    . Multiple Vitamin (MULTIVITAMIN) tablet Take 1 tablet by mouth daily.    . rivaroxaban (XARELTO) 20 MG TABS tablet Take 1 tablet (20 mg total) by mouth daily with supper. 14 tablet 0  . simvastatin (ZOCOR) 40 MG tablet Take 1 tablet by mouth daily.    . traMADol (ULTRAM) 50 MG tablet Take 50 mg by mouth 2 (two) times daily.    Carlena Hurl. XARELTO 20 MG TABS tablet TAKE 1 TABLET(20 MG) BY MOUTH DAILY WITH SUPPER 90 tablet 1   No current facility-administered medications for this visit.      Past Medical History:  Diagnosis Date  . Cataract   . CREST syndrome (HCC)   . Hypertension   . Nausea   . Paroxysmal atrial fibrillation (HCC) 02/02/2017  . Severe pulmonary hypertension (HCC)    on cath 02/2017  . Symptomatic bradycardia 04/08/2017    ROS:   All systems reviewed  and negative except as noted in the HPI.   Past Surgical History:  Procedure Laterality Date  . ABDOMINAL HYSTERECTOMY    . EYE SURGERY    . PACEMAKER IMPLANT N/A 04/08/2017   Procedure: Pacemaker Implant;  Surgeon: Marinus Mawaylor, Gregg W, MD;  [facility];  Service: Cardiovascular;  Laterality: N/A;     Family History  Problem Relation Age of Onset  . Heart disease Mother   . Stroke Father   . Stroke Sister   . Stroke Sister   . Cancer Sister   . Diabetes Sister      Social History   Socioeconomic History  . Marital status: Married    Spouse name: Not on file  . Number of children: Not on file  . Years of education: Not on file  . Highest education level: Not on file  Occupational History  . Not on file  Social Needs  . Financial resource strain: Not on file  . Food insecurity:    Worry: Not on file    Inability: Not on file  . Transportation needs:    Medical: Not on file    Non-medical: Not on file  Tobacco Use  . Smoking status: Never Smoker  . Smokeless tobacco: Never Used  Substance and Sexual Activity  . Alcohol use: No  . Drug use: No  .  Sexual activity: Never  Lifestyle  . Physical activity:    Days per week: Not on file    Minutes per session: Not on file  . Stress: Not on file  Relationships  . Social connections:    Talks on phone: Not on file    Gets together: Not on file    Attends religious service: Not on file    Active member of club or organization: Not on file    Attends meetings of clubs or organizations: Not on file    Relationship status: Not on file  . Intimate partner violence:    Fear of current or ex partner: Not on file    Emotionally abused: Not on file    Physically abused: Not on file    Forced sexual activity: Not on file  Other Topics Concern  . Not on file  Social History Narrative  . Not on file     BP 128/60   Pulse 70   Ht 5\' 4"  (1.626 m)   Wt 140 lb 3.2 oz (63.6 kg)   BMI 24.07 kg/m    Physical Exam:  Well appearing NAD HEENT: Unremarkable Neck:  No JVD, no thyromegally Lymphatics:  No adenopathy Back:  No CVA tenderness Lungs:  Clear HEART:  Regular rate rhythm, no murmurs, no rubs, no clicks Abd:  soft, positive bowel sounds, no organomegally, no rebound, no guarding Ext:  2 plus pulses, no edema, no cyanosis, no clubbing Skin:  No rashes no nodules Neuro:  CN II through XII intact, motor grossly intact  EKG - atrial fib with ventricular pacing  DEVICE  Normal device function.  See PaceArt for details.   Assess/Plan: 1. Atrial fib - her rates are controlled and she is asymptomatic. She will continue her current meds. 2. PPM - her St. Jude PM has been reprogrammed VVIR. She will follow up in several months. 3. HTN - her blood pressure is well controlled.  4. Leg pain - she is convinced that her leg pain is due to the xarelto. I have recommended she start Eliquis.  Leonia Reeves.D.

## 2018-05-04 NOTE — Patient Instructions (Addendum)
Medication Instructions:  Please start Eliquis 5 mg one tablet twice a day. Continue all other medications as listed.  Follow-Up: Follow up in 1 year with Dr. Ladona Ridgelaylor.  You will receive a letter in the mail 2 months before you are due.  Please call us when you receive this letter to schedule your follow up appointment.  Remote monitoring is used to monitor your Pacemaker of ICD from home. This monitoring reduces the number of office visits required to check your device to one time per year. It allows us to keep an eye on the functioning of your device to ensure it is working properly. You are scheduled for a device check from home on 07/20/18. You may send your transmission at any time that day. If you have a wireless device, the transmission will be sent automatically. After your physician reviews your transmission, you will receive a postcard with your next transmission date.  If you need a refill on your cardiac medications before your next appointment, please call your pharmacy.  Thank you for choosing [facility]!!

## 2018-06-23 IMAGING — DX DG CHEST 2V
2 series · 2 of 2 positions shown · non-contrast
Comparison: Chest x-ray of April 08, 2017

CLINICAL DATA: Status post permanent pacemaker placement. History
of severe pulmonary hypertension, paroxysms mole atrial
fibrillation, crest syndrome, nonsmoker.

EXAM:
CHEST  2 VIEW

[chest lat]
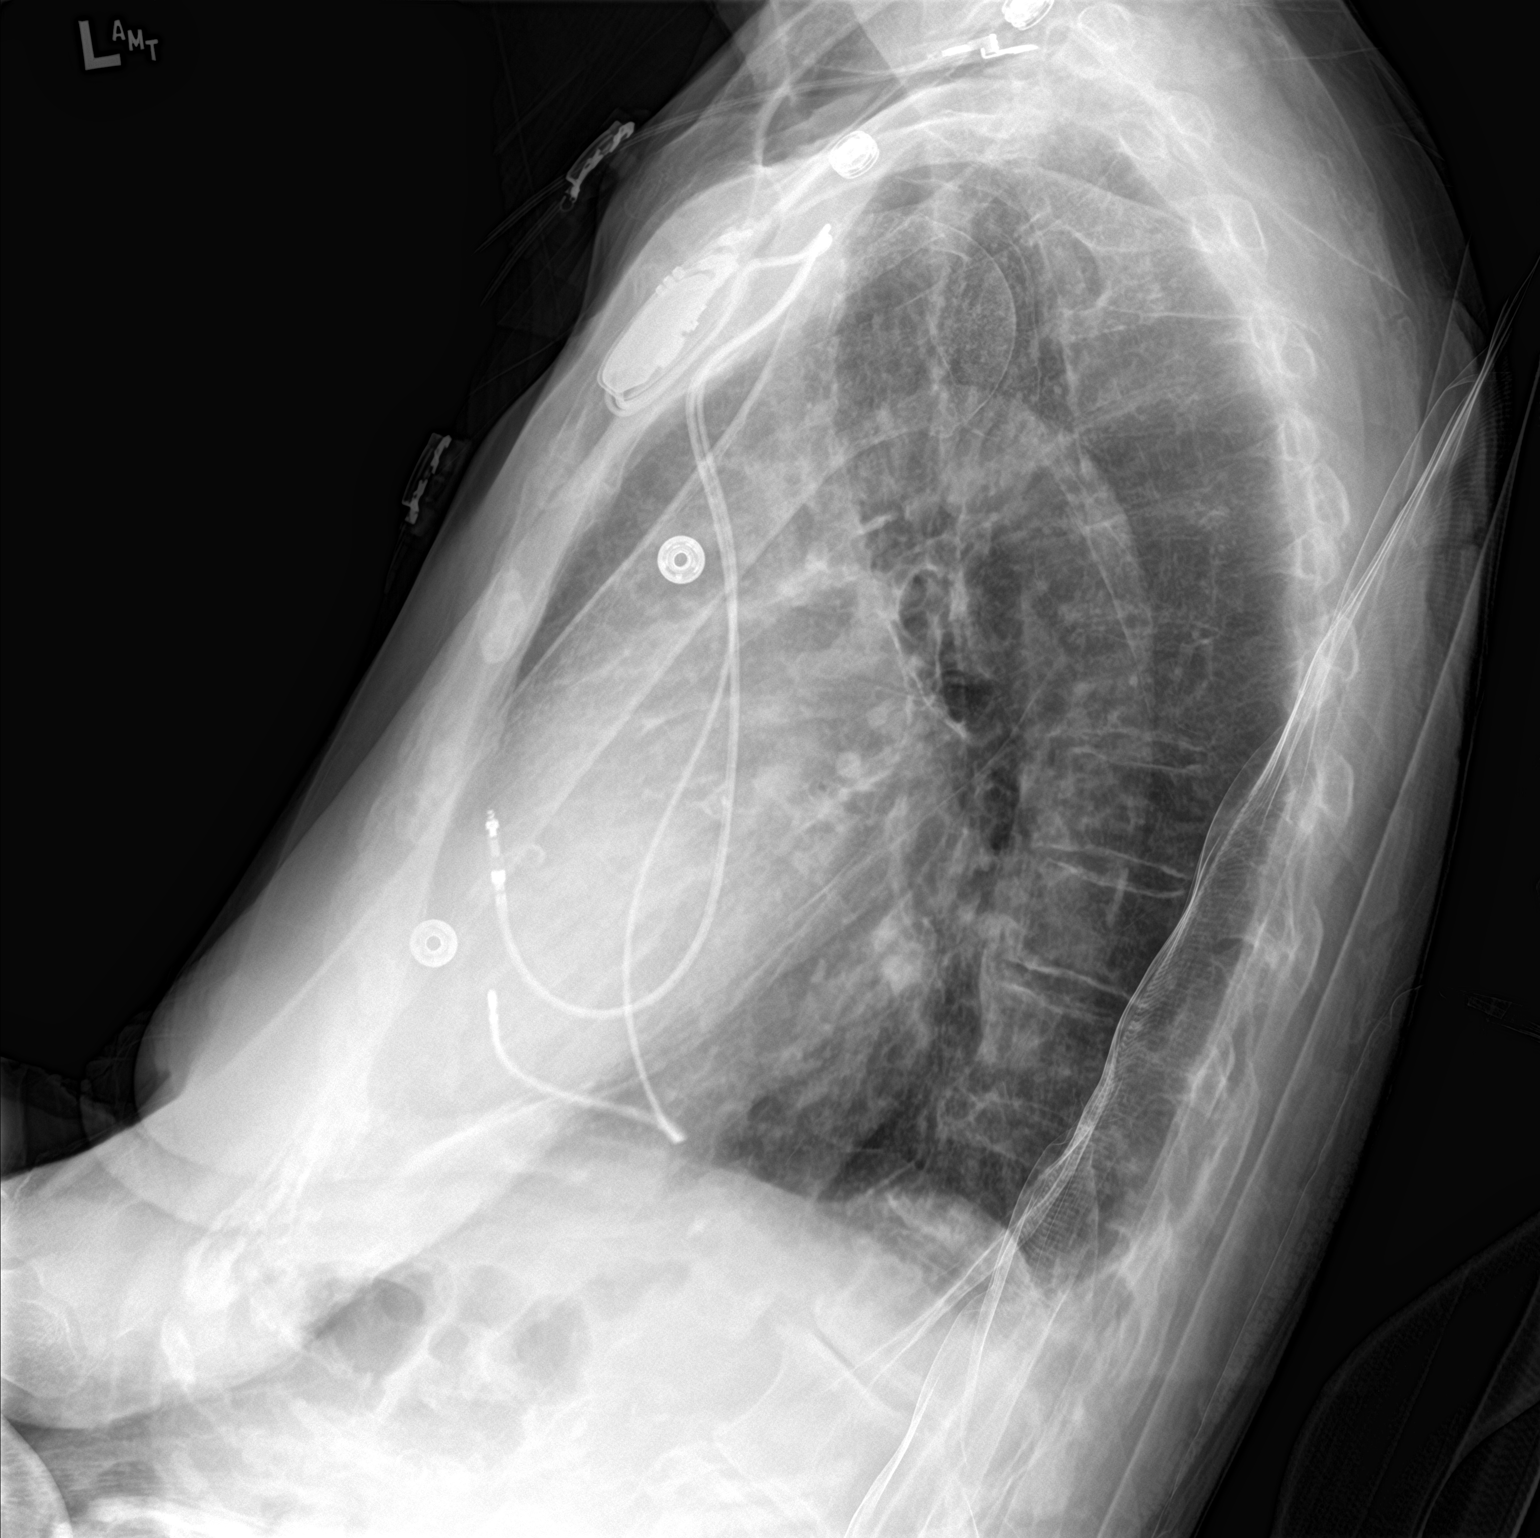

[chest ap]
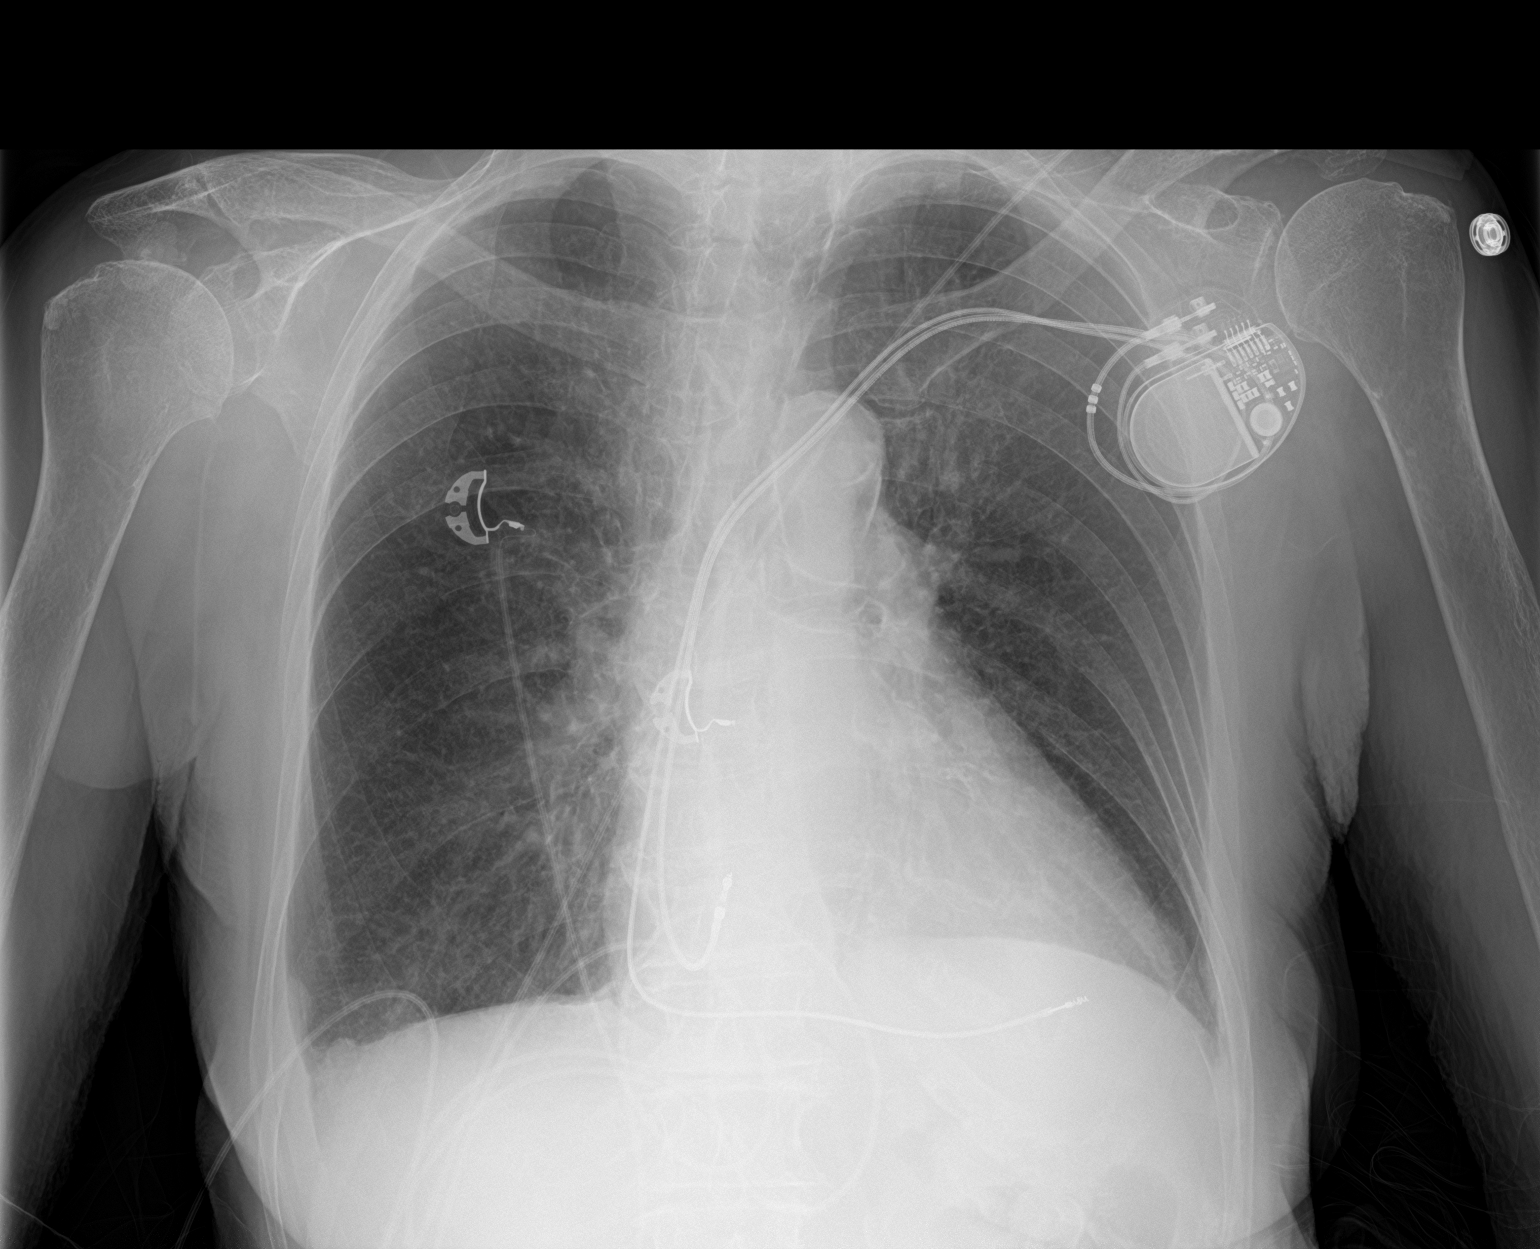

[2 of 2 positions shown; findings below may reference images not displayed]

FINDINGS: The lungs are mildly hyperinflated. There is no focal infiltrate.
There is stable pleural thickening at the right lung base laterally.
The heart is top-normal in size. The pulmonary vascularity is not
engorged. The ICD electrodes are in reasonable position. There is
calcification in the wall of the aortic arch. The bony thorax
exhibits no acute abnormality.
IMPRESSION: There is no postprocedure complication following permanent pacemaker
placement.

Thoracic aortic atherosclerosis.

## 2018-07-09 ENCOUNTER — Other Ambulatory Visit: Payer: Self-pay | Admitting: Cardiovascular Disease

## 2018-07-15 ENCOUNTER — Ambulatory Visit (INDEPENDENT_AMBULATORY_CARE_PROVIDER_SITE_OTHER): Payer: Medicare Other | Admitting: Cardiovascular Disease

## 2018-07-15 ENCOUNTER — Encounter: Payer: Self-pay | Admitting: Cardiovascular Disease

## 2018-07-15 VITALS — BP 138/58 | HR 67 | Ht 64.5 in | Wt 138.0 lb

## 2018-07-15 DIAGNOSIS — I270 Primary pulmonary hypertension: Secondary | ICD-10-CM

## 2018-07-15 DIAGNOSIS — I10 Essential (primary) hypertension: Secondary | ICD-10-CM | POA: Diagnosis not present

## 2018-07-15 DIAGNOSIS — R0602 Shortness of breath: Secondary | ICD-10-CM

## 2018-07-15 DIAGNOSIS — E7800 Pure hypercholesterolemia, unspecified: Secondary | ICD-10-CM

## 2018-07-15 NOTE — Patient Instructions (Addendum)
Medication Instructions:  STOP SIMVASTATIN   Labwork: LABS [facility]   Testing/Procedures: Your physician has requested that you have an echocardiogram. Echocardiography is a painless test that uses sound waves to create images of your heart. It provides your doctor with information about the size and shape of your heart and how well your heart's chambers and valves are working. This procedure takes approximately one hour. There are no restrictions for this procedure. [facility] AT [address]  Your physician has recommended that you have a pulmonary function test. Pulmonary Function Tests are a group of tests that measure how well air moves in and out of your lungs.  A chest x-ray takes a picture of the organs and structures inside the chest, including the heart, lungs, and blood vessels. This test can show several things, including, whether the heart is enlarges; whether fluid is building up in the lungs; and whether pacemaker / defibrillator leads are still in place.  VQ SCAN   Follow-Up: Your physician recommends that you schedule a follow-up appointment in: 6-8 WEEKS   If you need a refill on your cardiac medications before your next appointment, please call your pharmacy.  Ventilation-Perfusion Scan A ventilation-perfusion scan is a scan to look at the airflow (ventilation) and blood flow (perfusion) in your lungs. It is most often used to look for blood clots that may have traveled to your lungs. During this scan, radioactive compounds are injected into your body or are breathed in (inhale). These radioactive compounds are detected by a special camera during the scan, are given at very low doses, are not harmful to you, and last in your body for a very short time. Tell a health care provider about:  Any allergies you have.  All medicines you are taking, including vitamins, herbs, eye drops, creams, and over-the-counter medicines.  Any blood disorders you have.  Any  surgeries you have had.  Any medical conditions you have.  Possibility of pregnancy, if this applies.  Breastfeeding, if this applies. What are the risks? Generally, this is a safe procedure. However, as with any procedure, complications can occur. A possible complication includes having an allergic reaction to the radioactive compounds. What happens before the procedure?  Do not smoke before your test.  Take medicine as directed by your health care provider. What happens during the procedure?  A small needle will be placed in a vein in your arm or hand. This needle will stay in place for the entire exam.  A small amount of very short-acting radioactive material will be injected.  Your lungs will then be scanned using a special camera. This camera will record the images.  You will be asked to inhale a second radioactive compound. After this, the lungs are scanned again. What happens after the procedure?  You may go home unless your health care provider instructs you differently.  You may continue with normal activities and diet as instructed by your health care provider. This information is not intended to replace advice given to you by your health care provider. Make sure you discuss any questions you have with your health care provider. Document Released: 09/27/2000 Document Revised: 03/07/2016 Document Reviewed: 04/15/2013 Elsevier Interactive Patient Education  2017 Elsevier Inc.   Pulmonary Function Tests Pulmonary function tests (PFTs) are used to measure how well your lungs work, find out what is causing your lung problems, and figure out the best treatment for you. You may have PFTs: When you have an illness  involving the lungs. To follow changes in your lung function over time if you have a chronic lung disease. If you are an IT trainer. This checks the effects of being exposed to chemicals over a long period of time. To check lung function before having  surgery or other procedures. To check your lungs if you smoke. To check if prescribed medicines or treatments are helping your lungs.  Your results will be compared to the expected lung function of someone with healthy lungs who is similar to you in: Age. Gender. Height. Weight. Race or ethnicity.  This is done to show how your lungs compare to normal lung function (percent predicted). This is how your health care provider knows if your lung function is normal or not. If you have had PFTs done before, your health care provider will compare your current results with past results. This shows if your lung function is better, worse, or the same as before. Tell a health care provider about: Any allergies you have. All medicines you are taking, including inhaler or nebulizer medicines, vitamins, herbs, eye drops, creams, and over-the-counter medicines. Any blood disorders you have. Any surgeries you have had, especially recent eye surgery, abdominal surgery, or chest surgery. These can make PFTs difficult or unsafe. Any medical conditions you have, including chest pain or heart problems, tuberculosis, or respiratory infections such as pneumonia, a cold, or the flu. Any fear of being in closed spaces (claustrophobia). Some of your tests may be in a closed space. What are the risks? Generally, this is a safe procedure. However, problems may occur, including: Light-headedness due to over-breathing (hyperventilation). An asthma attack from deep breathing. A collapsed lung.  What happens before the procedure? Take over-the-counter and prescription medicines only as told by your health care provider. If you take inhaler or nebulizer medicines, ask your health care provider which medicines you should take on the day of your testing. Some inhaler medicines may interfere with PFTs if they are taken shortly before the tests. Follow your health care provider's instructions on eating and drinking  restrictions. This may include avoiding eating large meals and drinking alcohol before the testing. Do not use any products that contain nicotine or tobacco, such as cigarettes and e-cigarettes. If you need help quitting, ask your health care provider. Wear comfortable clothing that will not interfere with breathing. What happens during the procedure? You will be given a soft nose clip to wear. This is done so all of your breaths will go through your mouth instead of your nose. You will be given a germ-free (sterile) mouthpiece. It will be attached to a machine that measures your breathing (spirometer). You will be asked to do various breathing maneuvers. The maneuvers will be done by breathing in (inhaling) and breathing out (exhaling). You may be asked to repeat the maneuvers several times before the testing is done. It is important to follow the instructions exactly to get accurate results. Make sure to blow as hard and as fast as you can when you are told to do so. You may be given a medicine that makes the small air passages in your lungs larger (bronchodilator) after testing has been done. This medicine will make it easier for you to breathe. The tests will be repeated after the bronchodilator has taken effect. You will be monitored carefully during the procedure for faintness, dizziness, trouble breathing, or any other problems. The procedure may vary among health care providers and hospitals. What happens after the procedure? It  is up to you to get your test results. Ask your health care provider, or the department that is doing the tests, when your results will be ready. After you have received your test results, talk with your health care provider about treatment options, if necessary. Summary Pulmonary function tests (PFTs) are used to measure how well your lungs work, find out what is causing your lung problems, and figure out the best treatment for you. Wear comfortable clothing that  will not interfere with breathing. It is up to you to get your test results. After you have received them, talk with your health care provider about treatment options, if necessary. This information is not intended to replace advice given to you by your health care provider. Make sure you discuss any questions you have with your health care provider. Document Released: 05/23/2004 Document Revised: 08/22/2016 Document Reviewed: 08/22/2016 Elsevier Interactive Patient Education  [employer].

## 2018-07-15 NOTE — Progress Notes (Signed)
Cardiology Office Note   Date:  07/15/2018   ID:  Crystal Espinoza, DOB [birth year], MRN [id number]  PCP:  Merri Brunette, MD  Cardiologist:   Chilton Si, MD  EP: Dr. Ladona Ridgel   History of Present Illness: Crystal Espinoza is a 84 y.o. female with RBBB, symptomatic bradycardia s/p PPM, scleroderma and atrial flutter here for follow up.  She was initially seen 01/2017 for the evaluation of atrial flutter.  Crystal Espinoza saw Dr. Renne Crigler on 01/30/17 and was incidentally noted to be in atrial flutter. Her ventricular rate was 57 bpm. She was started on Eliquis and referred to cardiology for further evaluation.  She was asymptomatic other than occasional fluttering.  She was referred for an echo 02/2017 that revealed LVEF 60-65%. There was moderate left and right atrial enlargement.  She had severe pulmonary hypertension with PASP 72 mmHg.  After that appointment she underwent EGD on 03/2017 and was hypertensive to 230/130.  THe procedure was cancelled ans she was started on amlodipine.  She was seen in the ED 03/2017 with symptomatic bradycardia.  She had 2:1 AV conduction.  She had a St. Jude dual chamber pacemaker implanted by Dr. Ladona Ridgel.  Her blood pressure remained poorly-controlled so amlodipine was increased to 10 mg and she was started on carvedilol.  Since her last appointment Crystal Espinoza has been feeling mostly well.  After seeing Dr. Ladona Ridgel amlodipine was increased to 10 mg daily.  At her last appointment she felt more dizzy on the higher dose so amlodipine was reduced to 5 mg and carvedilol was increased.  She has been feeling well and has no lightheadedness or dizziness.  However she does note increasing dyspnea on exertion.  She feels short of breath with minimal exertion such as changing sheets on her bed or making a bed.  She tries not to go up hills or steps because of the shortness of breath.  She has no lower extremity edema, orthopnea, or PND.  She notes that her hands are always cold and  white and it is worse in cold weather.  She had her husband continue to struggle with the price of Xarelto.   Past Medical History:  Diagnosis Date  . Cataract   . CREST syndrome (HCC)   . Hypertension   . Nausea   . Paroxysmal atrial fibrillation (HCC) 02/02/2017  . Severe pulmonary hypertension (HCC)    on cath 02/2017  . Symptomatic bradycardia 04/08/2017    Past Surgical History:  Procedure Laterality Date  . ABDOMINAL HYSTERECTOMY    . EYE SURGERY    . PACEMAKER IMPLANT N/A 04/08/2017   Procedure: Pacemaker Implant;  Surgeon: Marinus Maw, MD;  Location: [facility] INVASIVE CV LAB;  Service: Cardiovascular;  Laterality: N/A;     Current Outpatient Medications  Medication Sig Dispense Refill  . acetaminophen (TYLENOL) 325 MG tablet Take 650 mg by mouth daily.    Marland Kitchen amLODipine (NORVASC) 5 MG tablet TAKE 1 TABLET BY MOUTH DAILY 90 tablet 3  . calcium citrate-vitamin D (CITRACAL+D) 315-200 MG-UNIT per tablet Take 1 tablet by mouth 2 (two) times daily.    . carvedilol (COREG) 6.25 MG tablet Take 1 tablet (6.25 mg total) by mouth 2 (two) times daily. 60 tablet 6  . Glucosamine-MSM-Hyaluronic Acd (JOINT HEALTH PO) Take 2,500 mg by mouth daily.    Marland Kitchen levothyroxine (SYNTHROID) 100 MCG tablet Take 1 tablet by mouth daily.    . Multiple Vitamin (MULTIVITAMIN) tablet Take 1 tablet by mouth  daily.    . rivaroxaban (XARELTO) 20 MG TABS tablet Take 20 mg by mouth daily with supper.    . traMADol (ULTRAM) 50 MG tablet Take 50 mg by mouth 2 (two) times daily.     No current facility-administered medications for this visit.     Allergies:   Patient has no known allergies.    Social History:  The patient  reports that she has never smoked. She has never used smokeless tobacco. She reports that she does not drink alcohol or use drugs.   Family History:  The patient's family history includes Cancer in her sister; Diabetes in her sister; Heart disease in her mother; Stroke in her father, sister, and  sister.    ROS:  Please see the history of present illness.   Otherwise, review of systems are positive for none.   All other systems are reviewed and negative.    PHYSICAL EXAM: VS:  BP (!) 138/58   Pulse 67   Ht 5' 4.5" (1.638 m)   Wt 138 lb (62.6 kg)   SpO2 93%   BMI 23.32 kg/m  , BMI Body mass index is 23.32 kg/m. GENERAL:  Well appearing HEENT: Pupils equal round and reactive, fundi not visualized, oral mucosa unremarkable NECK:  No jugular venous distention, waveform within normal limits, carotid upstroke brisk and symmetric, no bruits LUNGS:  Clear to auscultation bilaterally HEART:  RRR.  PMI not displaced or sustained,S1 and S2 within normal limits, no S3, no S4, no clicks, no rubs, no murmurs ABD:  Flat, positive bowel sounds normal in frequency in pitch, no bruits, no rebound, no guarding, no midline pulsatile mass, no hepatomegaly, no splenomegaly EXT:  1 plus DP/PT bilaterally, no edema, no clubbing.  Bilateral hands cold with cyanosis  SKIN:  No rashes no nodules NEURO:  Cranial nerves II through XII grossly intact, motor grossly intact throughout PSYCH:  Cognitively intact, oriented to person place and time   EKG:  EKG is not ordered today. The ekg ordered today demonstrates atrial flutter. Ventricular rate 61 bpm. Variable AV conduction. Right bundle branch block. 01/30/17: Atrial flutter. Ventricular rate 63 bpm. Right bundle branch block.  Recent Labs: No results found for requested labs within last 8760 hours.   01/23/17: WBC 3.9, hemoglobin 13.1, hematocrit 40.3, platelets 127 Sodium 141, potassium 4.4, BUN 16, creatinine 0.8 AST 13, ALT 19 Total cholesterol 168, triglycerides 72, HDL 65, LDL 89   Lipid Panel No results found for: CHOL, TRIG, HDL, CHOLHDL, VLDL, LDLCALC, LDLDIRECT    Wt Readings from Last 3 Encounters:  07/15/18 138 lb (62.6 kg)  05/04/18 140 lb 3.2 oz (63.6 kg)  11/10/17 141 lb (64 kg)      ASSESSMENT AND PLAN:  # Paroxysmal  atrial fibrillation/flutter: Crystal Espinoza is currently in sinus rhythm.  Continue Xarelto and carvedilol.  # Hyperlipidemia: I suspect simvastatin may be causing her cramping.  We will hold it until follow up.  If the pain persists we will check ABIs.   # Hypertension:  Blood pressure is reasonably controlled.  She was dizzy with tighter control.  Goal <140/90.  Continue amlodipine and carvedilol.  # Severe pulmonary hypertension: She is short of breath and has CREST syndrome.  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  We will get a V/Q scan to assess for chronic thromboembolic disease.  She was not a smoker but will check PFTs.  Check ANA, RF, ANCA, ACA, anti-CCP, anti-scleroderma, and anti-Smith antibodies to assess for autoimmune causes.  She does not snore or have apnea so no sleep study.  Repeat echo.  Will hold off on RHC for now.  # PPM: Managed by Dr. Ladona Ridgel. Device stable.   Current medicines are reviewed at length with the patient today.  The patient does not have concerns regarding medicines.  The following changes have been made: none  Labs/ tests ordered today include:   Orders Placed This Encounter  Procedures  . NM Pulmonary Perf and Vent  . DG Chest 2 View  . ANA  . Anti-Smith antibody  . Rheumatoid Factor  . Anti-Scleroderma Antibody  . Antineutrophil Cytoplasmic Ab  . Basic metabolic panel  . CYCLIC CITRUL PEPTIDE ANTIBODY, IGG/IGA  . ECHOCARDIOGRAM COMPLETE  . Pulmonary function test     Disposition:   FU with Kaeo Jacome C. Duke Salvia, MD, [facility] in 6-8 weeks.    Signed, Sameer Teeple C. Duke Salvia, MD, [facility]  07/15/2018 7:14 PM    [facility]

## 2018-07-16 ENCOUNTER — Telehealth: Payer: Self-pay | Admitting: *Deleted

## 2018-07-16 NOTE — Telephone Encounter (Signed)
Spoke with patient who called to cancel VQ scan, PFT's, and chest xray. Strongly encouraged to have but stated she did not feel up to and does not want to have. She will have Echo and keep follow up with Dr Duke Salvia. Again I encouraged patient to have, still refusing.  Advised would cancel but to call back if she wanted to get at a later date. Will forward to Dr Duke Salvia so she will be aware.

## 2018-07-17 ENCOUNTER — Ambulatory Visit (HOSPITAL_COMMUNITY): Payer: Medicare Other | Attending: Cardiology

## 2018-07-17 ENCOUNTER — Other Ambulatory Visit: Payer: Self-pay

## 2018-07-17 DIAGNOSIS — I119 Hypertensive heart disease without heart failure: Secondary | ICD-10-CM | POA: Diagnosis not present

## 2018-07-17 DIAGNOSIS — I081 Rheumatic disorders of both mitral and tricuspid valves: Secondary | ICD-10-CM | POA: Diagnosis not present

## 2018-07-17 DIAGNOSIS — I4891 Unspecified atrial fibrillation: Secondary | ICD-10-CM | POA: Insufficient documentation

## 2018-07-17 DIAGNOSIS — Z950 Presence of cardiac pacemaker: Secondary | ICD-10-CM | POA: Insufficient documentation

## 2018-07-17 DIAGNOSIS — I313 Pericardial effusion (noninflammatory): Secondary | ICD-10-CM | POA: Insufficient documentation

## 2018-07-17 DIAGNOSIS — R001 Bradycardia, unspecified: Secondary | ICD-10-CM | POA: Insufficient documentation

## 2018-07-17 DIAGNOSIS — I4510 Unspecified right bundle-branch block: Secondary | ICD-10-CM | POA: Insufficient documentation

## 2018-07-17 DIAGNOSIS — R0602 Shortness of breath: Secondary | ICD-10-CM | POA: Insufficient documentation

## 2018-07-17 DIAGNOSIS — I270 Primary pulmonary hypertension: Secondary | ICD-10-CM

## 2018-07-17 DIAGNOSIS — J90 Pleural effusion, not elsewhere classified: Secondary | ICD-10-CM | POA: Insufficient documentation

## 2018-07-17 DIAGNOSIS — I272 Pulmonary hypertension, unspecified: Secondary | ICD-10-CM | POA: Diagnosis present

## 2018-07-17 DIAGNOSIS — I4892 Unspecified atrial flutter: Secondary | ICD-10-CM | POA: Diagnosis not present

## 2018-07-17 LAB — ANTI-SMITH ANTIBODY

## 2018-07-17 LAB — BASIC METABOLIC PANEL
BUN/Creatinine Ratio: 17 (ref 12–28)
BUN: 10 mg/dL (ref 8–27)
CALCIUM: 9.5 mg/dL (ref 8.7–10.3)
CO2: 24 mmol/L (ref 20–29)
Chloride: 101 mmol/L (ref 96–106)
Creatinine, Ser: 0.6 mg/dL (ref 0.57–1.00)
GFR calc Af Amer: 95 mL/min/{1.73_m2} (ref >59)
GFR calc non Af Amer: 82 mL/min/{1.73_m2} (ref >59)
GLUCOSE: 89 mg/dL (ref 65–99)
Potassium: 5 mmol/L (ref 3.5–5.2)
Sodium: 139 mmol/L (ref 134–144)

## 2018-07-17 LAB — ANTI-SCLERODERMA ANTIBODY: Scleroderma SCL-70: <0.2 AI (ref 0.0–0.9)

## 2018-07-17 LAB — CYCLIC CITRUL PEPTIDE ANTIBODY, IGG/IGA: CYCLIC CITRULLIN PEPTIDE AB: 6 U (ref 0–19)

## 2018-07-17 LAB — ANTINEUTROPHIL CYTOPLASMIC AB
Atypical pANCA: <1:20 {titer}
Cytoplasmic (C-ANCA): <1:20 {titer}

## 2018-07-17 LAB — ANA: ANA: POSITIVE — AB

## 2018-07-17 LAB — RHEUMATOID FACTOR: RHEUMATOID FACTOR: 36.9 [IU]/mL — AB (ref 0.0–13.9)

## 2018-07-20 ENCOUNTER — Ambulatory Visit (HOSPITAL_COMMUNITY): Payer: Medicare Other

## 2018-07-20 ENCOUNTER — Ambulatory Visit (INDEPENDENT_AMBULATORY_CARE_PROVIDER_SITE_OTHER): Payer: Medicare Other | Admitting: *Deleted

## 2018-07-20 DIAGNOSIS — R001 Bradycardia, unspecified: Secondary | ICD-10-CM | POA: Diagnosis not present

## 2018-07-21 ENCOUNTER — Encounter (HOSPITAL_COMMUNITY): Payer: Medicare Other

## 2018-07-21 NOTE — Progress Notes (Signed)
Remote pacemaker transmission.   

## 2018-07-29 ENCOUNTER — Encounter: Payer: Self-pay | Admitting: Cardiology

## 2018-08-27 ENCOUNTER — Ambulatory Visit: Payer: Medicare Other | Admitting: Cardiovascular Disease

## 2018-08-28 ENCOUNTER — Telehealth (HOSPITAL_COMMUNITY): Payer: Self-pay | Admitting: Cardiology

## 2018-08-28 NOTE — Telephone Encounter (Signed)
Attempting to contact patient to schedule NP appointment.  Severe pulm htn [facility]  Per Amy Clegg,NP will need a work in appt with Dr Shirlee LatchMcLean  No answer unable to leave message, will attempt later

## 2018-08-31 NOTE — Telephone Encounter (Signed)
At this time does not wish to schedule an appt. She would like to keep follow up as scheduled with Chilton Siiffany [place] and further discuss the need for this referral. Office phone number given to patient so she can schedule at a later time

## 2018-09-04 LAB — CUP PACEART REMOTE DEVICE CHECK
Battery Remaining Longevity: 110 mo
Battery Remaining Percentage: 95.5 %
Battery Voltage: 2.99 V
Brady Statistic AS VP Percent: 98 %
Brady Statistic AS VS Percent: 1 %
Date Time Interrogation Session: 20191007063922
Implantable Lead Implant Date: 20180626
Implantable Lead Location: 753859
Implantable Lead Location: 753860
Implantable Lead Model: 1999
Implantable Pulse Generator Implant Date: 20180626
Lead Channel Impedance Value: 450 Ohm
Lead Channel Pacing Threshold Pulse Width: 0.5 ms
Lead Channel Pacing Threshold Pulse Width: 0.5 ms
Lead Channel Sensing Intrinsic Amplitude: 1 mV
Lead Channel Sensing Intrinsic Amplitude: 6.1 mV
Lead Channel Setting Pacing Amplitude: 2 V
Lead Channel Setting Pacing Amplitude: 2.5 V
Lead Channel Setting Pacing Pulse Width: 0.5 ms
MDC IDC LEAD IMPLANT DT: 20180626
MDC IDC MSMT LEADCHNL RA PACING THRESHOLD AMPLITUDE: 0.5 V
MDC IDC MSMT LEADCHNL RV IMPEDANCE VALUE: 450 Ohm
MDC IDC MSMT LEADCHNL RV PACING THRESHOLD AMPLITUDE: 0.5 V
MDC IDC SET LEADCHNL RV SENSING SENSITIVITY: 5 mV
MDC IDC STAT BRADY AP VP PERCENT: 1 %
MDC IDC STAT BRADY AP VS PERCENT: 1 %
MDC IDC STAT BRADY RA PERCENT PACED: 1.2 %
MDC IDC STAT BRADY RV PERCENT PACED: 99 %
Pulse Gen Serial Number: 8918641

## 2018-09-23 ENCOUNTER — Telehealth: Payer: Self-pay | Admitting: *Deleted

## 2018-09-23 NOTE — Telephone Encounter (Signed)
Patient assistance [facility] 20 mg #2 Lot # [id number] exp 8/21 at front for pick up, patient aware

## 2018-10-05 NOTE — Telephone Encounter (Signed)
Spoke with patient and advised medication approved

## 2018-10-19 ENCOUNTER — Ambulatory Visit (INDEPENDENT_AMBULATORY_CARE_PROVIDER_SITE_OTHER): Payer: Medicare HMO

## 2018-10-19 DIAGNOSIS — R001 Bradycardia, unspecified: Secondary | ICD-10-CM

## 2018-10-20 LAB — CUP PACEART REMOTE DEVICE CHECK
Battery Remaining Longevity: 110 mo
Battery Remaining Percentage: 95.5 %
Battery Voltage: 3.01 V
Brady Statistic RA Percent Paced: 1.3 %
Date Time Interrogation Session: 20200106072444
Implantable Lead Implant Date: 20180626
Implantable Lead Location: 753860
Implantable Pulse Generator Implant Date: 20180626
Lead Channel Impedance Value: 480 Ohm
Lead Channel Pacing Threshold Amplitude: 0.5 V
Lead Channel Pacing Threshold Pulse Width: 0.5 ms
Lead Channel Sensing Intrinsic Amplitude: 1 mV
Lead Channel Setting Pacing Amplitude: 2 V
MDC IDC LEAD IMPLANT DT: 20180626
MDC IDC LEAD LOCATION: 753859
MDC IDC MSMT LEADCHNL RV IMPEDANCE VALUE: 440 Ohm
MDC IDC MSMT LEADCHNL RV PACING THRESHOLD AMPLITUDE: 0.5 V
MDC IDC MSMT LEADCHNL RV PACING THRESHOLD PULSEWIDTH: 0.5 ms
MDC IDC MSMT LEADCHNL RV SENSING INTR AMPL: 5 mV
MDC IDC SET LEADCHNL RV PACING AMPLITUDE: 2.5 V
MDC IDC SET LEADCHNL RV PACING PULSEWIDTH: 0.5 ms
MDC IDC SET LEADCHNL RV SENSING SENSITIVITY: 5 mV
MDC IDC STAT BRADY AP VP PERCENT: 1 %
MDC IDC STAT BRADY AP VS PERCENT: 1 %
MDC IDC STAT BRADY AS VP PERCENT: 97 %
MDC IDC STAT BRADY AS VS PERCENT: 2 %
MDC IDC STAT BRADY RV PERCENT PACED: 98 %
Pulse Gen Model: 2272
Pulse Gen Serial Number: 8918641

## 2018-10-20 NOTE — Progress Notes (Signed)
Remote pacemaker transmission.   

## 2018-10-23 ENCOUNTER — Other Ambulatory Visit: Payer: Self-pay | Admitting: Cardiovascular Disease

## 2018-11-10 ENCOUNTER — Other Ambulatory Visit: Payer: Self-pay | Admitting: Cardiovascular Disease

## 2018-11-10 ENCOUNTER — Ambulatory Visit: Payer: Medicare Other | Admitting: Cardiovascular Disease

## 2018-12-09 ENCOUNTER — Other Ambulatory Visit: Payer: Self-pay | Admitting: Cardiovascular Disease

## 2019-01-18 ENCOUNTER — Telehealth: Payer: Self-pay

## 2019-01-18 ENCOUNTER — Other Ambulatory Visit: Payer: Self-pay

## 2019-01-18 ENCOUNTER — Ambulatory Visit (INDEPENDENT_AMBULATORY_CARE_PROVIDER_SITE_OTHER): Payer: Medicare HMO | Admitting: *Deleted

## 2019-01-18 DIAGNOSIS — R001 Bradycardia, unspecified: Secondary | ICD-10-CM

## 2019-01-18 LAB — CUP PACEART REMOTE DEVICE CHECK
Battery Remaining Longevity: 110 mo
Battery Remaining Percentage: 95.5 %
Battery Voltage: 2.99 V
Brady Statistic AP VP Percent: 1.3 %
Brady Statistic AP VS Percent: 1 %
Brady Statistic AS VP Percent: 95 %
Brady Statistic AS VS Percent: 3.8 %
Brady Statistic RA Percent Paced: 1.8 %
Brady Statistic RV Percent Paced: 96 %
Date Time Interrogation Session: 20200406171111
Implantable Lead Implant Date: 20180626
Implantable Lead Implant Date: 20180626
Implantable Lead Location: 753859
Implantable Lead Location: 753860
Implantable Lead Model: 1999
Implantable Pulse Generator Implant Date: 20180626
Lead Channel Impedance Value: 440 Ohm
Lead Channel Impedance Value: 460 Ohm
Lead Channel Pacing Threshold Amplitude: 0.5 V
Lead Channel Pacing Threshold Amplitude: 0.5 V
Lead Channel Pacing Threshold Pulse Width: 0.5 ms
Lead Channel Pacing Threshold Pulse Width: 0.5 ms
Lead Channel Sensing Intrinsic Amplitude: 1 mV
Lead Channel Sensing Intrinsic Amplitude: 5 mV
Lead Channel Setting Pacing Amplitude: 2 V
Lead Channel Setting Pacing Amplitude: 2.5 V
Lead Channel Setting Pacing Pulse Width: 0.5 ms
Lead Channel Setting Sensing Sensitivity: 5 mV
Pulse Gen Model: 2272
Pulse Gen Serial Number: 8918641

## 2019-01-18 NOTE — Telephone Encounter (Signed)
Spoke with patient to remind of missed remote transmission 

## 2019-01-26 ENCOUNTER — Encounter: Payer: Self-pay | Admitting: Cardiology

## 2019-01-26 NOTE — Progress Notes (Signed)
Remote pacemaker transmission.   

## 2019-03-15 ENCOUNTER — Other Ambulatory Visit: Payer: Self-pay | Admitting: Cardiovascular Disease

## 2019-03-24 DIAGNOSIS — M25461 Effusion, right knee: Secondary | ICD-10-CM | POA: Diagnosis not present

## 2019-03-24 DIAGNOSIS — R202 Paresthesia of skin: Secondary | ICD-10-CM | POA: Diagnosis not present

## 2019-03-24 DIAGNOSIS — I7300 Raynaud's syndrome without gangrene: Secondary | ICD-10-CM | POA: Diagnosis not present

## 2019-03-24 DIAGNOSIS — M1990 Unspecified osteoarthritis, unspecified site: Secondary | ICD-10-CM | POA: Diagnosis not present

## 2019-03-24 DIAGNOSIS — M349 Systemic sclerosis, unspecified: Secondary | ICD-10-CM | POA: Diagnosis not present

## 2019-03-24 DIAGNOSIS — M25569 Pain in unspecified knee: Secondary | ICD-10-CM | POA: Diagnosis not present

## 2019-04-07 DIAGNOSIS — R202 Paresthesia of skin: Secondary | ICD-10-CM | POA: Diagnosis not present

## 2019-04-07 DIAGNOSIS — M25461 Effusion, right knee: Secondary | ICD-10-CM | POA: Diagnosis not present

## 2019-04-07 DIAGNOSIS — M25562 Pain in left knee: Secondary | ICD-10-CM | POA: Diagnosis not present

## 2019-04-07 DIAGNOSIS — M349 Systemic sclerosis, unspecified: Secondary | ICD-10-CM | POA: Diagnosis not present

## 2019-04-07 DIAGNOSIS — I7300 Raynaud's syndrome without gangrene: Secondary | ICD-10-CM | POA: Diagnosis not present

## 2019-04-07 DIAGNOSIS — M1990 Unspecified osteoarthritis, unspecified site: Secondary | ICD-10-CM | POA: Diagnosis not present

## 2019-04-07 DIAGNOSIS — M25569 Pain in unspecified knee: Secondary | ICD-10-CM | POA: Diagnosis not present

## 2019-04-19 ENCOUNTER — Ambulatory Visit (INDEPENDENT_AMBULATORY_CARE_PROVIDER_SITE_OTHER): Payer: Medicare HMO | Admitting: *Deleted

## 2019-04-19 DIAGNOSIS — R001 Bradycardia, unspecified: Secondary | ICD-10-CM

## 2019-04-19 LAB — CUP PACEART REMOTE DEVICE CHECK
Date Time Interrogation Session: 20200706155541
Implantable Lead Implant Date: 20180626
Implantable Lead Implant Date: 20180626
Implantable Lead Location: 753859
Implantable Lead Location: 753860
Implantable Lead Model: 1999
Implantable Pulse Generator Implant Date: 20180626
Pulse Gen Model: 2272
Pulse Gen Serial Number: 8918641

## 2019-04-28 ENCOUNTER — Encounter: Payer: Self-pay | Admitting: Cardiology

## 2019-04-28 NOTE — Progress Notes (Signed)
Remote pacemaker transmission.   

## 2019-06-15 ENCOUNTER — Other Ambulatory Visit: Payer: Self-pay | Admitting: Cardiovascular Disease

## 2019-06-15 NOTE — Telephone Encounter (Signed)
Please review for refill. Thank you! 

## 2019-06-16 NOTE — Telephone Encounter (Signed)
65f 62.6kg Scr 0.6 07/15/18 ccr 54mlmin Lovw/[place] 07/15/18

## 2019-06-30 ENCOUNTER — Ambulatory Visit (INDEPENDENT_AMBULATORY_CARE_PROVIDER_SITE_OTHER): Payer: Medicare HMO | Admitting: Internal Medicine

## 2019-06-30 ENCOUNTER — Encounter: Payer: Self-pay | Admitting: Internal Medicine

## 2019-06-30 ENCOUNTER — Other Ambulatory Visit: Payer: Self-pay

## 2019-06-30 VITALS — BP 146/72 | HR 96 | Ht 64.5 in | Wt 132.0 lb

## 2019-06-30 DIAGNOSIS — Z950 Presence of cardiac pacemaker: Secondary | ICD-10-CM | POA: Diagnosis not present

## 2019-06-30 DIAGNOSIS — I480 Paroxysmal atrial fibrillation: Secondary | ICD-10-CM | POA: Diagnosis not present

## 2019-06-30 DIAGNOSIS — R001 Bradycardia, unspecified: Secondary | ICD-10-CM | POA: Diagnosis not present

## 2019-06-30 NOTE — Patient Instructions (Signed)
Medication Instructions:  Your physician recommends that you continue on your current medications as directed. Please refer to the Current Medication list given to you today.  Labwork: None ordered.  Testing/Procedures: None ordered.  Follow-Up: Your physician wants you to follow-up in: one year with Dr. Lovena Le.   You will receive a reminder letter in the mail two months in advance. If you don't receive a letter, please call our office to schedule the follow-up appointment.  Remote monitoring is used to monitor your Pacemaker from home. This monitoring reduces the number of office visits required to check your device to one time per year. It allows [place] to keep an eye on the functioning of your device to ensure it is working properly. You are scheduled for a device check from home on 07/19/2019. You may send your transmission at any time that day. If you have a wireless device, the transmission will be sent automatically. After your physician reviews your transmission, you will receive a postcard with your next transmission date.  Any Other Special Instructions Will Be Listed Below (If Applicable).  If you need a refill on your cardiac medications before your next appointment, please call your pharmacy.

## 2019-06-30 NOTE — Progress Notes (Signed)
HPI Crystal Espinoza returns today for followup. 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 She has a h/o CREST and pulmonary HTN. She has been stable in the interim with no chest pain or sob. No edema. She has minimal palpitations No Known Allergies   Current Outpatient Medications  Medication Sig Dispense Refill  . acetaminophen (TYLENOL) 325 MG tablet Take 650 mg by mouth daily.    Marland Kitchen. amLODipine (NORVASC) 5 MG tablet TAKE 1 TABLET BY MOUTH DAILY 90 tablet 3  . calcium citrate-vitamin D (CITRACAL+D) 315-200 MG-UNIT per tablet Take 1 tablet by mouth 2 (two) times daily.    . carvedilol (COREG) 6.25 MG tablet TAKE 1 TABLET BY MOUTH TWICE DAILY 60 tablet 6  . Glucosamine-MSM-Hyaluronic Acd (JOINT HEALTH PO) Take 2,500 mg by mouth daily.    Marland Kitchen. levothyroxine (SYNTHROID) 100 MCG tablet Take 1 tablet by mouth daily.    . Multiple Vitamin (MULTIVITAMIN) tablet Take 1 tablet by mouth daily.    . traMADol (ULTRAM) 50 MG tablet Take 50 mg by mouth 2 (two) times daily.    Carlena Hurl. XARELTO 20 MG TABS tablet TAKE 1 TABLET(20 MG) BY MOUTH DAILY WITH SUPPER 90 tablet 0   No current facility-administered medications for this visit.      Past Medical History:  Diagnosis Date  . Cataract   . CREST syndrome (HCC)   . Hypertension   . Nausea   . Paroxysmal atrial fibrillation (HCC) 02/02/2017  . Severe pulmonary hypertension (HCC)    on cath 02/2017  . Symptomatic bradycardia 04/08/2017    ROS:   All systems reviewed and negative except as noted in the HPI.   Past Surgical History:  Procedure Laterality Date  . ABDOMINAL HYSTERECTOMY    . EYE SURGERY    . PACEMAKER IMPLANT N/A 04/08/2017   Procedure: Pacemaker Implant;  Surgeon: Marinus Mawaylor, Matheus Spiker W, MD;  [facility];  Service: Cardiovascular;  Laterality: N/A;     Family History  Problem Relation Age of Onset  . Heart disease Mother   . Stroke Father   . Stroke Sister   . Stroke  Sister   . Cancer Sister   . Diabetes Sister      Social History   Socioeconomic History  . Marital status: Married    Spouse name: Not on file  . Number of children: Not on file  . Years of education: Not on file  . Highest education level: Not on file  Occupational History  . Not on file  Social Needs  . Financial resource strain: Not on file  . Food insecurity    Worry: Not on file    Inability: Not on file  . Transportation needs    Medical: Not on file    Non-medical: Not on file  Tobacco Use  . Smoking status: Never Smoker  . Smokeless tobacco: Never Used  Substance and Sexual Activity  . Alcohol use: No  . Drug use: No  . Sexual activity: Never  Lifestyle  . Physical activity    Days per week: Not on file    Minutes per session: Not on file  . Stress: Not on file  Relationships  . Social Musicianconnections    Talks on phone: Not on file    Gets together: Not on file    Attends religious service: Not on file    Active member of club or organization: Not  on file    Attends meetings of clubs or organizations: Not on file    Relationship status: Not on file  . Intimate partner violence    Fear of current or ex partner: Not on file    Emotionally abused: Not on file    Physically abused: Not on file    Forced sexual activity: Not on file  Other Topics Concern  . Not on file  Social History Narrative  . Not on file     BP (!) 146/72   Pulse 96   Ht 5' 4.5" (1.638 m)   Wt 132 lb (59.9 kg)   SpO2 96%   BMI 22.31 kg/m   Physical Exam:  Well appearing elderly woman, NAD HEENT: Unremarkable Neck:  No JVD, no thyromegally Lymphatics:  No adenopathy Back:  No CVA tenderness Lungs:  Clear with no wheezes HEART:  Regular rate rhythm, no murmurs, no rubs, no clicks Abd:  soft, positive bowel sounds, no organomegally, no rebound, no guarding Ext:  2 plus pulses, no edema, no cyanosis, no clubbing Skin:  No rashes no nodules Neuro:  CN II through XII intact,  motor grossly intact  EKG - atrial fib with a controlled VR.   DEVICE  Normal device function.  See PaceArt for details.   Assess/Plan: 1. Atrial fib - her VR is well controlled. She does not have palpitations.  2. HTN - her bp is up a bit. She will reduce her sodium intake.  3. PPM - her St. Jude PPM is reprogrammed to VVIR.   Mikle Bosworth.D.

## 2019-07-19 ENCOUNTER — Ambulatory Visit (INDEPENDENT_AMBULATORY_CARE_PROVIDER_SITE_OTHER): Payer: Medicare HMO | Admitting: *Deleted

## 2019-07-19 DIAGNOSIS — I4510 Unspecified right bundle-branch block: Secondary | ICD-10-CM

## 2019-07-19 DIAGNOSIS — I480 Paroxysmal atrial fibrillation: Secondary | ICD-10-CM

## 2019-07-20 LAB — CUP PACEART REMOTE DEVICE CHECK
Battery Remaining Longevity: 122 mo
Battery Remaining Percentage: 95.5 %
Battery Voltage: 3.01 V
Brady Statistic RV Percent Paced: 62 %
Date Time Interrogation Session: 20201005060029
Implantable Lead Implant Date: 20180626
Implantable Lead Implant Date: 20180626
Implantable Lead Location: 753859
Implantable Lead Location: 753860
Implantable Lead Model: 1999
Implantable Pulse Generator Implant Date: 20180626
Lead Channel Impedance Value: 430 Ohm
Lead Channel Pacing Threshold Amplitude: 0.5 V
Lead Channel Pacing Threshold Pulse Width: 0.5 ms
Lead Channel Sensing Intrinsic Amplitude: 5.5 mV
Lead Channel Setting Pacing Amplitude: 2.5 V
Lead Channel Setting Pacing Pulse Width: 0.5 ms
Lead Channel Setting Sensing Sensitivity: 5 mV
Pulse Gen Model: 2272
Pulse Gen Serial Number: 8918641

## 2019-07-26 NOTE — Progress Notes (Signed)
Remote pacemaker transmission.   

## 2019-08-10 ENCOUNTER — Other Ambulatory Visit: Payer: Self-pay | Admitting: Cardiovascular Disease

## 2019-08-11 ENCOUNTER — Other Ambulatory Visit: Payer: Self-pay | Admitting: Cardiovascular Disease

## 2019-09-13 ENCOUNTER — Other Ambulatory Visit: Payer: Self-pay | Admitting: Cardiovascular Disease

## 2019-10-18 ENCOUNTER — Ambulatory Visit (INDEPENDENT_AMBULATORY_CARE_PROVIDER_SITE_OTHER): Payer: Medicare HMO | Admitting: *Deleted

## 2019-10-18 DIAGNOSIS — I480 Paroxysmal atrial fibrillation: Secondary | ICD-10-CM

## 2019-10-18 LAB — CUP PACEART REMOTE DEVICE CHECK
Battery Remaining Longevity: 121 mo
Battery Remaining Percentage: 95.5 %
Battery Voltage: 3.01 V
Brady Statistic RV Percent Paced: 56 %
Date Time Interrogation Session: 20210104091146
Implantable Lead Implant Date: 20180626
Implantable Lead Implant Date: 20180626
Implantable Lead Location: 753859
Implantable Lead Location: 753860
Implantable Lead Model: 1999
Implantable Pulse Generator Implant Date: 20180626
Lead Channel Impedance Value: 400 Ohm
Lead Channel Pacing Threshold Amplitude: 0.5 V
Lead Channel Pacing Threshold Pulse Width: 0.5 ms
Lead Channel Sensing Intrinsic Amplitude: 6.5 mV
Lead Channel Setting Pacing Amplitude: 2.5 V
Lead Channel Setting Pacing Pulse Width: 0.5 ms
Lead Channel Setting Sensing Sensitivity: 5 mV
Pulse Gen Model: 2272
Pulse Gen Serial Number: 8918641

## 2019-10-26 LAB — CUP PACEART INCLINIC DEVICE CHECK
Date Time Interrogation Session: 20200916100138
Implantable Lead Implant Date: 20180626
Implantable Lead Implant Date: 20180626
Implantable Lead Location: 753859
Implantable Lead Location: 753860
Implantable Lead Model: 1999
Implantable Pulse Generator Implant Date: 20180626
Pulse Gen Model: 2272
Pulse Gen Serial Number: 8918641

## 2019-12-29 ENCOUNTER — Other Ambulatory Visit: Payer: Self-pay | Admitting: Cardiovascular Disease

## 2020-01-17 ENCOUNTER — Ambulatory Visit (INDEPENDENT_AMBULATORY_CARE_PROVIDER_SITE_OTHER): Payer: Medicare Other | Admitting: *Deleted

## 2020-01-17 DIAGNOSIS — I480 Paroxysmal atrial fibrillation: Secondary | ICD-10-CM | POA: Diagnosis not present

## 2020-01-17 LAB — CUP PACEART REMOTE DEVICE CHECK
Battery Remaining Longevity: 122 mo
Battery Remaining Percentage: 95.5 %
Battery Voltage: 3.01 V
Brady Statistic RV Percent Paced: 67 %
Date Time Interrogation Session: 20210405020015
Implantable Lead Implant Date: 20180626
Implantable Lead Implant Date: 20180626
Implantable Lead Location: 753859
Implantable Lead Location: 753860
Implantable Lead Model: 1999
Implantable Pulse Generator Implant Date: 20180626
Lead Channel Impedance Value: 440 Ohm
Lead Channel Pacing Threshold Amplitude: 0.5 V
Lead Channel Pacing Threshold Pulse Width: 0.5 ms
Lead Channel Sensing Intrinsic Amplitude: 5.2 mV
Lead Channel Setting Pacing Amplitude: 2.5 V
Lead Channel Setting Pacing Pulse Width: 0.5 ms
Lead Channel Setting Sensing Sensitivity: 5 mV
Pulse Gen Model: 2272
Pulse Gen Serial Number: 8918641

## 2020-01-18 NOTE — Progress Notes (Signed)
PPM Remote  

## 2020-01-20 ENCOUNTER — Other Ambulatory Visit: Payer: Self-pay

## 2020-01-20 ENCOUNTER — Inpatient Hospital Stay (HOSPITAL_COMMUNITY)
Admission: EM | Admit: 2020-01-20 | Discharge: 2020-01-25 | DRG: 101 | Disposition: A | Discharge status: Skilled Nursing Facility | Payer: Medicare Other | Attending: Internal Medicine | Admitting: Internal Medicine

## 2020-01-20 ENCOUNTER — Encounter (HOSPITAL_COMMUNITY): Payer: Medicare Other

## 2020-01-20 ENCOUNTER — Inpatient Hospital Stay (HOSPITAL_COMMUNITY): Payer: Medicare Other

## 2020-01-20 ENCOUNTER — Emergency Department (HOSPITAL_COMMUNITY): Payer: Medicare Other

## 2020-01-20 DIAGNOSIS — E039 Hypothyroidism, unspecified: Secondary | ICD-10-CM | POA: Diagnosis not present

## 2020-01-20 DIAGNOSIS — R404 Transient alteration of awareness: Secondary | ICD-10-CM | POA: Diagnosis not present

## 2020-01-20 DIAGNOSIS — I63231 Cerebral infarction due to unspecified occlusion or stenosis of right carotid arteries: Secondary | ICD-10-CM | POA: Diagnosis not present

## 2020-01-20 DIAGNOSIS — Z681 Body mass index (BMI) 19 or less, adult: Secondary | ICD-10-CM | POA: Diagnosis not present

## 2020-01-20 DIAGNOSIS — Z743 Need for continuous supervision: Secondary | ICD-10-CM | POA: Diagnosis not present

## 2020-01-20 DIAGNOSIS — Z741 Need for assistance with personal care: Secondary | ICD-10-CM | POA: Diagnosis not present

## 2020-01-20 DIAGNOSIS — M6281 Muscle weakness (generalized): Secondary | ICD-10-CM | POA: Diagnosis not present

## 2020-01-20 DIAGNOSIS — I10 Essential (primary) hypertension: Secondary | ICD-10-CM | POA: Diagnosis not present

## 2020-01-20 DIAGNOSIS — R400 Somnolence: Secondary | ICD-10-CM | POA: Diagnosis not present

## 2020-01-20 DIAGNOSIS — I4892 Unspecified atrial flutter: Secondary | ICD-10-CM | POA: Diagnosis not present

## 2020-01-20 DIAGNOSIS — I639 Cerebral infarction, unspecified: Secondary | ICD-10-CM | POA: Diagnosis not present

## 2020-01-20 DIAGNOSIS — R29818 Other symptoms and signs involving the nervous system: Secondary | ICD-10-CM | POA: Diagnosis not present

## 2020-01-20 DIAGNOSIS — Z823 Family history of stroke: Secondary | ICD-10-CM

## 2020-01-20 DIAGNOSIS — R4182 Altered mental status, unspecified: Secondary | ICD-10-CM | POA: Diagnosis present

## 2020-01-20 DIAGNOSIS — Z809 Family history of malignant neoplasm, unspecified: Secondary | ICD-10-CM | POA: Diagnosis not present

## 2020-01-20 DIAGNOSIS — R569 Unspecified convulsions: Principal | ICD-10-CM | POA: Diagnosis present

## 2020-01-20 DIAGNOSIS — R29712 NIHSS score 12: Secondary | ICD-10-CM | POA: Diagnosis not present

## 2020-01-20 DIAGNOSIS — R627 Adult failure to thrive: Secondary | ICD-10-CM | POA: Diagnosis not present

## 2020-01-20 DIAGNOSIS — Z20822 Contact with and (suspected) exposure to covid-19: Secondary | ICD-10-CM | POA: Diagnosis not present

## 2020-01-20 DIAGNOSIS — R4781 Slurred speech: Secondary | ICD-10-CM | POA: Diagnosis not present

## 2020-01-20 DIAGNOSIS — Z888 Allergy status to other drugs, medicaments and biological substances status: Secondary | ICD-10-CM

## 2020-01-20 DIAGNOSIS — Z950 Presence of cardiac pacemaker: Secondary | ICD-10-CM | POA: Diagnosis not present

## 2020-01-20 DIAGNOSIS — I480 Paroxysmal atrial fibrillation: Secondary | ICD-10-CM | POA: Diagnosis not present

## 2020-01-20 DIAGNOSIS — I2729 Other secondary pulmonary hypertension: Secondary | ICD-10-CM | POA: Diagnosis not present

## 2020-01-20 DIAGNOSIS — R230 Cyanosis: Secondary | ICD-10-CM | POA: Diagnosis present

## 2020-01-20 DIAGNOSIS — G9349 Other encephalopathy: Secondary | ICD-10-CM | POA: Diagnosis not present

## 2020-01-20 DIAGNOSIS — R29810 Facial weakness: Secondary | ICD-10-CM | POA: Diagnosis not present

## 2020-01-20 DIAGNOSIS — Z7901 Long term (current) use of anticoagulants: Secondary | ICD-10-CM | POA: Diagnosis not present

## 2020-01-20 DIAGNOSIS — I6521 Occlusion and stenosis of right carotid artery: Secondary | ICD-10-CM | POA: Diagnosis not present

## 2020-01-20 DIAGNOSIS — R001 Bradycardia, unspecified: Secondary | ICD-10-CM | POA: Diagnosis present

## 2020-01-20 DIAGNOSIS — Z8249 Family history of ischemic heart disease and other diseases of the circulatory system: Secondary | ICD-10-CM

## 2020-01-20 DIAGNOSIS — Z79899 Other long term (current) drug therapy: Secondary | ICD-10-CM

## 2020-01-20 DIAGNOSIS — M2550 Pain in unspecified joint: Secondary | ICD-10-CM | POA: Diagnosis not present

## 2020-01-20 DIAGNOSIS — Z7401 Bed confinement status: Secondary | ICD-10-CM | POA: Diagnosis not present

## 2020-01-20 DIAGNOSIS — G459 Transient cerebral ischemic attack, unspecified: Secondary | ICD-10-CM | POA: Diagnosis not present

## 2020-01-20 DIAGNOSIS — R414 Neurologic neglect syndrome: Secondary | ICD-10-CM | POA: Diagnosis not present

## 2020-01-20 DIAGNOSIS — I469 Cardiac arrest, cause unspecified: Secondary | ICD-10-CM | POA: Diagnosis not present

## 2020-01-20 DIAGNOSIS — R4701 Aphasia: Secondary | ICD-10-CM | POA: Diagnosis present

## 2020-01-20 DIAGNOSIS — E785 Hyperlipidemia, unspecified: Secondary | ICD-10-CM | POA: Diagnosis present

## 2020-01-20 DIAGNOSIS — H9190 Unspecified hearing loss, unspecified ear: Secondary | ICD-10-CM | POA: Diagnosis present

## 2020-01-20 DIAGNOSIS — M341 CR(E)ST syndrome: Secondary | ICD-10-CM | POA: Diagnosis present

## 2020-01-20 DIAGNOSIS — I6389 Other cerebral infarction: Secondary | ICD-10-CM | POA: Diagnosis not present

## 2020-01-20 DIAGNOSIS — Z833 Family history of diabetes mellitus: Secondary | ICD-10-CM

## 2020-01-20 DIAGNOSIS — G9341 Metabolic encephalopathy: Secondary | ICD-10-CM | POA: Diagnosis not present

## 2020-01-20 DIAGNOSIS — Z03818 Encounter for observation for suspected exposure to other biological agents ruled out: Secondary | ICD-10-CM | POA: Diagnosis not present

## 2020-01-20 DIAGNOSIS — R1311 Dysphagia, oral phase: Secondary | ICD-10-CM | POA: Diagnosis not present

## 2020-01-20 DIAGNOSIS — R2689 Other abnormalities of gait and mobility: Secondary | ICD-10-CM | POA: Diagnosis not present

## 2020-01-20 DIAGNOSIS — D696 Thrombocytopenia, unspecified: Secondary | ICD-10-CM | POA: Diagnosis present

## 2020-01-20 DIAGNOSIS — R0602 Shortness of breath: Secondary | ICD-10-CM

## 2020-01-20 DIAGNOSIS — I7300 Raynaud's syndrome without gangrene: Secondary | ICD-10-CM | POA: Diagnosis present

## 2020-01-20 DIAGNOSIS — F0390 Unspecified dementia without behavioral disturbance: Secondary | ICD-10-CM | POA: Diagnosis present

## 2020-01-20 DIAGNOSIS — Z79890 Hormone replacement therapy (postmenopausal): Secondary | ICD-10-CM

## 2020-01-20 DIAGNOSIS — I4891 Unspecified atrial fibrillation: Secondary | ICD-10-CM | POA: Diagnosis not present

## 2020-01-20 LAB — POCT I-STAT 7, (LYTES, BLD GAS, ICA,H+H)
Acid-base deficit: 3 mmol/L — ABNORMAL HIGH (ref 0.0–2.0)
Bicarbonate: 20 mmol/L (ref 20.0–28.0)
Calcium, Ion: 1.22 mmol/L (ref 1.15–1.40)
HCT: 47 % — ABNORMAL HIGH (ref 36.0–46.0)
Hemoglobin: 16 g/dL — ABNORMAL HIGH (ref 12.0–15.0)
O2 Saturation: 98 %
Potassium: 4.3 mmol/L (ref 3.5–5.1)
Sodium: 135 mmol/L (ref 135–145)
TCO2: 21 mmol/L — ABNORMAL LOW (ref 22–32)
pCO2 arterial: 29.7 mmHg — ABNORMAL LOW (ref 32.0–48.0)
pH, Arterial: 7.436 (ref 7.350–7.450)
pO2, Arterial: 92 mmHg (ref 83.0–108.0)

## 2020-01-20 LAB — URINALYSIS, ROUTINE W REFLEX MICROSCOPIC
Bacteria, UA: NONE SEEN
Bilirubin Urine: NEGATIVE
Glucose, UA: NEGATIVE mg/dL
Ketones, ur: 5 mg/dL — AB
Nitrite: NEGATIVE
Protein, ur: NEGATIVE mg/dL
Specific Gravity, Urine: 1.017 (ref 1.005–1.030)
pH: 7 (ref 5.0–8.0)

## 2020-01-20 LAB — RESPIRATORY PANEL BY RT PCR (FLU A&B, COVID)
Influenza A by PCR: NEGATIVE
Influenza B by PCR: NEGATIVE
SARS Coronavirus 2 by RT PCR: NEGATIVE

## 2020-01-20 LAB — I-STAT CHEM 8, ED
BUN: 12 mg/dL (ref 8–23)
Calcium, Ion: 1.16 mmol/L (ref 1.15–1.40)
Chloride: 103 mmol/L (ref 98–111)
Creatinine, Ser: 0.8 mg/dL (ref 0.44–1.00)
Glucose, Bld: 109 mg/dL — ABNORMAL HIGH (ref 70–99)
HCT: 44 % (ref 36.0–46.0)
Hemoglobin: 15 g/dL (ref 12.0–15.0)
Potassium: 4 mmol/L (ref 3.5–5.1)
Sodium: 136 mmol/L (ref 135–145)
TCO2: 25 mmol/L (ref 22–32)

## 2020-01-20 LAB — DIFFERENTIAL
Abs Immature Granulocytes: 0.01 10*3/uL (ref 0.00–0.07)
Basophils Absolute: 0 10*3/uL (ref 0.0–0.1)
Basophils Relative: 0 %
Eosinophils Absolute: 0 10*3/uL (ref 0.0–0.5)
Eosinophils Relative: 1 %
Immature Granulocytes: 0 %
Lymphocytes Relative: 18 %
Lymphs Abs: 0.8 10*3/uL (ref 0.7–4.0)
Monocytes Absolute: 0.3 10*3/uL (ref 0.1–1.0)
Monocytes Relative: 7 %
Neutro Abs: 3.1 10*3/uL (ref 1.7–7.7)
Neutrophils Relative %: 74 %

## 2020-01-20 LAB — RAPID URINE DRUG SCREEN, HOSP PERFORMED
Amphetamines: NOT DETECTED
Barbiturates: NOT DETECTED
Benzodiazepines: NOT DETECTED
Cocaine: NOT DETECTED
Opiates: NOT DETECTED
Tetrahydrocannabinol: NOT DETECTED

## 2020-01-20 LAB — CBC
HCT: 46.4 % — ABNORMAL HIGH (ref 36.0–46.0)
Hemoglobin: 14.5 g/dL (ref 12.0–15.0)
MCH: 33.7 pg (ref 26.0–34.0)
MCHC: 31.3 g/dL (ref 30.0–36.0)
MCV: 107.9 fL — ABNORMAL HIGH (ref 80.0–100.0)
Platelets: 123 10*3/uL — ABNORMAL LOW (ref 150–400)
RBC: 4.3 MIL/uL (ref 3.87–5.11)
RDW: 15.8 % — ABNORMAL HIGH (ref 11.5–15.5)
WBC: 4.2 10*3/uL (ref 4.0–10.5)
nRBC: 0 % (ref 0.0–0.2)

## 2020-01-20 LAB — COMPREHENSIVE METABOLIC PANEL
ALT: 13 U/L (ref 0–44)
AST: 22 U/L (ref 15–41)
Albumin: 3.8 g/dL (ref 3.5–5.0)
Alkaline Phosphatase: 104 U/L (ref 38–126)
Anion gap: 14 (ref 5–15)
BUN: 11 mg/dL (ref 8–23)
CO2: 20 mmol/L — ABNORMAL LOW (ref 22–32)
Calcium: 9.1 mg/dL (ref 8.9–10.3)
Chloride: 104 mmol/L (ref 98–111)
Creatinine, Ser: 0.8 mg/dL (ref 0.44–1.00)
GFR calc Af Amer: >60 mL/min (ref >60)
GFR calc non Af Amer: >60 mL/min (ref >60)
Glucose, Bld: 111 mg/dL — ABNORMAL HIGH (ref 70–99)
Potassium: 4.3 mmol/L (ref 3.5–5.1)
Sodium: 138 mmol/L (ref 135–145)
Total Bilirubin: 1.3 mg/dL — ABNORMAL HIGH (ref 0.3–1.2)
Total Protein: 6.5 g/dL (ref 6.5–8.1)

## 2020-01-20 LAB — PROTIME-INR
INR: 1.1 (ref 0.8–1.2)
Prothrombin Time: 14.5 seconds (ref 11.4–15.2)

## 2020-01-20 LAB — CBG MONITORING, ED: Glucose-Capillary: 105 mg/dL — ABNORMAL HIGH (ref 70–99)

## 2020-01-20 LAB — ETHANOL: Alcohol, Ethyl (B): <10 mg/dL (ref <10)

## 2020-01-20 LAB — APTT: aPTT: 29 seconds (ref 24–36)

## 2020-01-20 MED ORDER — STROKE: EARLY STAGES OF RECOVERY BOOK
Freq: Once | Status: AC
  Administered 2020-01-20: 1
  Filled 2020-01-20: qty 1

## 2020-01-20 MED ORDER — ACETAMINOPHEN 325 MG PO TABS
650.0000 mg | ORAL_TABLET | ORAL | Status: DC | PRN
  Administered 2020-01-22 – 2020-01-23 (×2): 650 mg via ORAL
  Filled 2020-01-20 (×2): qty 2

## 2020-01-20 MED ORDER — SODIUM CHLORIDE 0.9 % IV SOLN: INTRAVENOUS | Status: DC

## 2020-01-20 MED ORDER — LEVETIRACETAM IN NACL 500 MG/100ML IV SOLN
500.0000 mg | Freq: Two times a day (BID) | INTRAVENOUS | Status: DC
  Administered 2020-01-21 – 2020-01-22 (×3): 500 mg via INTRAVENOUS
  Filled 2020-01-20 (×5): qty 100

## 2020-01-20 MED ORDER — LEVETIRACETAM IN NACL 500 MG/100ML IV SOLN: 500.0000 mg | Freq: Two times a day (BID) | INTRAVENOUS | Status: DC

## 2020-01-20 MED ORDER — ACETAMINOPHEN 160 MG/5ML PO SOLN: 650.0000 mg | ORAL | Status: DC | PRN

## 2020-01-20 MED ORDER — LORAZEPAM 2 MG/ML IJ SOLN
0.5000 mg | Freq: Once | INTRAMUSCULAR | Status: AC
  Administered 2020-01-20: 18:00:00 0.5 mg via INTRAVENOUS
  Filled 2020-01-20: qty 1

## 2020-01-20 MED ORDER — ENOXAPARIN SODIUM 60 MG/0.6ML ~~LOC~~ SOLN
50.0000 mg | Freq: Two times a day (BID) | SUBCUTANEOUS | Status: DC
  Administered 2020-01-20: 50 mg via SUBCUTANEOUS
  Filled 2020-01-20: qty 0.5
  Filled 2020-01-20: qty 0.6
  Filled 2020-01-20: qty 0.5
  Filled 2020-01-20: qty 0.6

## 2020-01-20 MED ORDER — HALOPERIDOL LACTATE 5 MG/ML IJ SOLN
1.0000 mg | Freq: Four times a day (QID) | INTRAMUSCULAR | Status: DC | PRN
  Administered 2020-01-20: 1 mg via INTRAVENOUS
  Filled 2020-01-20: qty 1

## 2020-01-20 MED ORDER — LEVOTHYROXINE SODIUM 100 MCG/5ML IV SOLN
50.0000 ug | Freq: Every day | INTRAVENOUS | Status: DC
  Administered 2020-01-21 – 2020-01-22 (×2): 50 ug via INTRAVENOUS
  Filled 2020-01-20 (×3): qty 5

## 2020-01-20 MED ORDER — LEVETIRACETAM IN NACL 1000 MG/100ML IV SOLN
1000.0000 mg | Freq: Two times a day (BID) | INTRAVENOUS | Status: DC
  Administered 2020-01-20: 1000 mg via INTRAVENOUS
  Filled 2020-01-20 (×2): qty 100

## 2020-01-20 MED ORDER — ACETAMINOPHEN 650 MG RE SUPP: 650.0000 mg | RECTAL | Status: DC | PRN

## 2020-01-20 MED ORDER — LABETALOL HCL 5 MG/ML IV SOLN: 5.0000 mg | INTRAVENOUS | Status: DC | PRN

## 2020-01-20 MED ORDER — IOHEXOL 350 MG/ML SOLN
100.0000 mL | Freq: Once | INTRAVENOUS | Status: AC | PRN
  Administered 2020-01-20: 17:00:00 100 mL via INTRAVENOUS

## 2020-01-20 NOTE — H&P (Signed)
History and Physical    Crystal Espinoza BMB:[id number] DOB: 1928-12-04 DOA: 01/20/2020  PCP: Merri Brunette, MD   Patient coming from: Home  I have personally briefly reviewed patient's old medical records in [facility]  Chief Complaint: AMS  HPI: Crystal Espinoza is a 86 y.o. female with medical history significant of with history of paroxysmal A. fib/a flutter, symptomatic bradycardia s/p PPM, severe pulmonary hypertension, hypertension presented with altered mental status.    Husband and son reported that patient does not have baseline dementia, she cannot read newspapers but she cannot cook. Patient was last seen normal around midnight.  Husband reported patient woke up very agitated, smashing furnitures and utensils in kitchen, and husband noticed some right-sided facial droop which disappeared later.  Son called the house and noted at 2:30 PM she was showing expressive aphasia.  EMS was called later in the day and patient showed aphasia in route.   ED Course: REV:[id number] of the cervical right ICA just beyond its origin with reconstitution intracranially near the communicating segment. This is age-indeterminate but probably chronic.  Review of Systems: Unable to perform patient confused  Past Medical History:  Diagnosis Date   Cataract    CREST syndrome (HCC)    Hypertension    Nausea    Paroxysmal atrial fibrillation (HCC) 02/02/2017   Severe pulmonary hypertension (HCC)    on cath 02/2017   Symptomatic bradycardia 04/08/2017    Past Surgical History:  Procedure Laterality Date   ABDOMINAL HYSTERECTOMY     EYE SURGERY     PACEMAKER IMPLANT N/A 04/08/2017   Procedure: Pacemaker Implant;  Surgeon: Marinus Maw, MD;  Location: [facility];  Service: Cardiovascular;  Laterality: N/A;     reports that she has never smoked. She has never used smokeless tobacco. She reports that she does not drink alcohol or use drugs.  Allergies  Allergen Reactions     Levaquin [Levofloxacin] Other (See Comments)    unknown    Family History  Problem Relation Age of Onset   Heart disease Mother    Stroke Father    Stroke Sister    Stroke Sister    Cancer Sister    Diabetes Sister      Prior to Admission medications   Medication Sig Start Date End Date Taking? Authorizing Provider  amLODipine (NORVASC) 5 MG tablet Take 1 tablet (5 mg total) by mouth daily. *NEEDS MD VISIT FOR FURTHER REFILLS-PLEASE CALL TO SCHEDULE.* 08/11/19  Yes Chilton Si, MD  calcium citrate-vitamin D (CITRACAL+D) 315-200 MG-UNIT per tablet Take 1 tablet by mouth 2 (two) times daily.   Yes [provider]  Glucosamine-MSM-Hyaluronic Acd (JOINT HEALTH PO) Take 2,500 mg by mouth daily.   Yes [provider]  levothyroxine (SYNTHROID) 100 MCG tablet Take 100 mcg by mouth daily.    Yes [provider]  Multiple Vitamin (MULTIVITAMIN) tablet Take 1 tablet by mouth daily.   Yes [provider]  XARELTO 20 MG TABS tablet TAKE 1 TABLET(20 MG) BY MOUTH DAILY WITH SUPPER Patient taking differently: Take 20 mg by mouth daily.  12/31/19  Yes Chilton Si, MD  carvedilol (COREG) 6.25 MG tablet TAKE 1 TABLET BY MOUTH TWICE DAILY Patient not taking: Reported on 01/20/2020 10/23/18   Chilton Si, MD    Physical Exam: Vitals:   01/20/20 1745 01/20/20 1800 01/20/20 1815 01/20/20 1830  BP: (!) 147/87 (!) 176/93 (!) 159/101 (!) 173/95  Pulse:    77  Resp: Marland Kitchen)  23 20 18  (!) 33  Temp:      TempSrc:      SpO2:    100%  Weight:      Height:        Constitutional: NAD, calm, comfortable Vitals:   01/20/20 1745 01/20/20 1800 01/20/20 1815 01/20/20 1830  BP: (!) 147/87 (!) 176/93 (!) 159/101 (!) 173/95  Pulse:    77  Resp: (!) 23 20 18  (!) 33  Temp:      TempSrc:      SpO2:    100%  Weight:      Height:       Eyes: PERRL, lids and conjunctivae normal ENMT: Mucous membranes are dry. Posterior pharynx clear of any exudate or  lesions.Normal dentition.  Neck: normal, supple, no masses, no thyromegaly Respiratory: clear to auscultation bilaterally, no wheezing, no crackles. Normal respiratory effort. No accessory muscle use.  Cardiovascular: A.  Flutter. No extremity edema. 2+ pedal pulses. No carotid bruits.  Abdomen: no tenderness, no masses palpated. No hepatosplenomegaly. Bowel sounds positive.  Musculoskeletal: no clubbing / cyanosis. No joint deformity upper and lower extremities. Good ROM, no contractures. Normal muscle tone.  Skin: Finger and toes cyanotic Neurologic: Mumbling, moving all limbs not following commands Psychiatric: Restless    Labs on Admission: I have personally reviewed following labs and imaging studies  CBC: Recent Labs  Lab 01/20/20 1558 01/20/20 1607  WBC 4.2  --   NEUTROABS 3.1  --   HGB 14.5 15.0  HCT 46.4* 44.0  MCV 107.9*  --   PLT 123*  --    Basic Metabolic Panel: Recent Labs  Lab 01/20/20 1558 01/20/20 1607  NA 138 136  K 4.3 4.0  CL 104 103  CO2 20*  --   GLUCOSE 111* 109*  BUN 11 12  CREATININE 0.80 0.80  CALCIUM 9.1  --    GFR: Estimated Creatinine Clearance: 41.2 mL/min (by C-G formula based on SCr of 0.8 mg/dL). Liver Function Tests: Recent Labs  Lab 01/20/20 1558  AST 22  ALT 13  ALKPHOS 104  BILITOT 1.3*  PROT 6.5  ALBUMIN 3.8   No results for input(s): LIPASE, AMYLASE in the last 168 hours. No results for input(s): AMMONIA in the last 168 hours. Coagulation Profile: Recent Labs  Lab 01/20/20 1558  INR 1.1   Cardiac Enzymes: No results for input(s): CKTOTAL, CKMB, CKMBINDEX, TROPONINI in the last 168 hours. BNP (last 3 results) No results for input(s): PROBNP in the last 8760 hours. HbA1C: No results for input(s): HGBA1C in the last 72 hours. CBG: Recent Labs  Lab 01/20/20 1602  GLUCAP 105*   Lipid Profile: No results for input(s): CHOL, HDL, LDLCALC, TRIG, CHOLHDL, LDLDIRECT in the last 72 hours. Thyroid Function  Tests: No results for input(s): TSH, T4TOTAL, FREET4, T3FREE, THYROIDAB in the last 72 hours. Anemia Panel: No results for input(s): VITAMINB12, FOLATE, FERRITIN, TIBC, IRON, RETICCTPCT in the last 72 hours. Urine analysis:    Component Value Date/Time   COLORURINE YELLOW 01/20/2020 1700   APPEARANCEUR CLEAR 01/20/2020 1700   LABSPEC 1.017 01/20/2020 1700   PHURINE 7.0 01/20/2020 1700   GLUCOSEU NEGATIVE 01/20/2020 1700   HGBUR SMALL (A) 01/20/2020 1700   BILIRUBINUR NEGATIVE 01/20/2020 1700   KETONESUR 5 (A) 01/20/2020 1700   PROTEINUR NEGATIVE 01/20/2020 1700   NITRITE NEGATIVE 01/20/2020 1700   LEUKOCYTESUR TRACE (A) 01/20/2020 1700    Radiological Exams on Admission: CT ANGIO HEAD W OR WO CONTRAST  Result Date:  01/20/2020 CLINICAL DATA:  Code stroke follow-up EXAM: CT ANGIOGRAPHY HEAD AND NECK CT PERFUSION BRAIN TECHNIQUE: Multidetector CT imaging of the head and neck was performed using the standard protocol during bolus administration of intravenous contrast. Multiplanar CT image reconstructions and MIPs were obtained to evaluate the vascular anatomy. Carotid stenosis measurements (when applicable) are obtained utilizing NASCET criteria, using the distal internal carotid diameter as the denominator. Multiphase CT imaging of the brain was performed following IV bolus contrast injection. Subsequent parametric perfusion maps were calculated using RAPID software. CONTRAST:  100mL OMNIPAQUE IOHEXOL 350 MG/ML SOLN COMPARISON:  None. FINDINGS: CTA NECK FINDINGS Aortic arch: Mild to moderate calcified plaque along the arch. Great vessel origins are patent. Right carotid system: Common and external carotid arteries are patent. Calcified plaque at the bifurcation. There is noncalcified plaque with occlusion of the cervical ICA just beyond the origin. No reconstitution in the neck. Left carotid system: Patent. Mild calcified plaque at the bifurcation without measurable stenosis. Vertebral arteries:  Patent.  Codominant. Skeleton: Degenerative changes of the cervical spine. Other neck: No mass or adenopathy. Upper chest: No apical lung mass. Review of the MIP images confirms the above findings CTA HEAD FINDINGS Anterior circulation: Intracranial right internal carotid is occluded with reconstitution at the level of the communicating segment. Left intracranial internal carotid arteries patent with calcified plaque but no significant stenosis. Anterior and middle cerebral arteries are patent. Posterior circulation: Intracranial vertebral arteries are patent. There is calcified plaque causing moderate stenosis on the left. Basilar artery is patent. Posterior cerebral arteries are patent. Bilateral posterior communicating arteries are present. Venous sinuses: Not well evaluated Review of the MIP images confirms the above findings CT Brain Perfusion Findings: Nondiagnostic due to motion artifact. 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 This is age-indeterminate but probably chronic. Otherwise, no large vessel occlusion or significant stenosis. Caliber of more distal right MCA branches is reduced relative to the left. Nondiagnostic perfusion imaging. Electronically Signed   By: Guadlupe SpanishPraneil  Patel M.D.   On: 01/20/2020 16:55   CT ANGIO NECK W OR WO CONTRAST  Result Date: 01/20/2020 CLINICAL DATA:  Code stroke follow-up EXAM: CT ANGIOGRAPHY HEAD AND NECK CT PERFUSION BRAIN TECHNIQUE: Multidetector CT imaging of the head and neck was performed using the standard protocol during bolus administration of intravenous contrast. Multiplanar CT image reconstructions and MIPs were obtained to evaluate the vascular anatomy. Carotid stenosis measurements (when applicable) are obtained utilizing NASCET criteria, using the distal internal carotid diameter as the denominator. Multiphase CT imaging of the brain was performed following IV  bolus contrast injection. Subsequent parametric perfusion maps were calculated using RAPID software. CONTRAST:  100mL OMNIPAQUE IOHEXOL 350 MG/ML SOLN COMPARISON:  None. FINDINGS: CTA NECK FINDINGS Aortic arch: Mild to moderate calcified plaque along the arch. Great vessel origins are patent. Right carotid system: Common and external carotid arteries are patent. Calcified plaque at the bifurcation. There is noncalcified plaque with occlusion of the cervical ICA just beyond the origin. No reconstitution in the neck. Left carotid system: Patent. Mild calcified plaque at the bifurcation without measurable stenosis. Vertebral arteries: Patent.  Codominant. Skeleton: Degenerative changes of the cervical spine. Other neck: No mass or adenopathy. Upper chest: No apical lung mass. Review of the MIP images confirms the above findings CTA HEAD FINDINGS Anterior circulation: Intracranial right internal carotid is occluded with reconstitution at the level of the communicating segment. Left intracranial internal carotid arteries patent with  calcified plaque but no significant stenosis. Anterior and middle cerebral arteries are patent. Posterior circulation: Intracranial vertebral arteries are patent. There is calcified plaque causing moderate stenosis on the left. Basilar artery is patent. Posterior cerebral arteries are patent. Bilateral posterior communicating arteries are present. Venous sinuses: Not well evaluated Review of the MIP images confirms the above findings CT Brain Perfusion Findings: Nondiagnostic due to motion artifact. 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 This is age-indeterminate but probably chronic. Otherwise, no large vessel occlusion or significant stenosis. Caliber of more distal right MCA branches is reduced relative to the left. Nondiagnostic perfusion imaging. Electronically Signed   By: Guadlupe Spanish  M.D.   On: 01/20/2020 16:55   CT CEREBRAL PERFUSION W CONTRAST  Result Date: 01/20/2020 CLINICAL DATA:  Code stroke follow-up EXAM: CT ANGIOGRAPHY HEAD AND NECK CT PERFUSION BRAIN TECHNIQUE: Multidetector CT imaging of the head and neck was performed using the standard protocol during bolus administration of intravenous contrast. Multiplanar CT image reconstructions and MIPs were obtained to evaluate the vascular anatomy. Carotid stenosis measurements (when applicable) are obtained utilizing NASCET criteria, using the distal internal carotid diameter as the denominator. Multiphase CT imaging of the brain was performed following IV bolus contrast injection. Subsequent parametric perfusion maps were calculated using RAPID software. CONTRAST:  OMNIPAQUE IOHEXOL 350 MG/ML SOLN COMPARISON:  None. FINDINGS: CTA NECK FINDINGS Aortic arch: Mild to moderate calcified plaque along the arch. Great vessel origins are patent. Right carotid system: Common and external carotid arteries are patent. Calcified plaque at the bifurcation. There is noncalcified plaque with occlusion of the cervical ICA just beyond the origin. No reconstitution in the neck. Left carotid system: Patent. Mild calcified plaque at the bifurcation without measurable stenosis. Vertebral arteries: Patent.  Codominant. Skeleton: Degenerative changes of the cervical spine. Other neck: No mass or adenopathy. Upper chest: No apical lung mass. Review of the MIP images confirms the above findings CTA HEAD FINDINGS Anterior circulation: Intracranial right internal carotid is occluded with reconstitution at the level of the communicating segment. Left intracranial internal carotid arteries patent with calcified plaque but no significant stenosis. Anterior and middle cerebral arteries are patent. Posterior circulation: Intracranial vertebral arteries are patent. There is calcified plaque causing moderate stenosis on the left. Basilar artery is patent. Posterior  cerebral arteries are patent. Bilateral posterior communicating arteries are present. Venous sinuses: Not well evaluated Review of the MIP images confirms the above findings CT Brain Perfusion Findings: Nondiagnostic due to motion artifact. 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 This is age-indeterminate but probably chronic. Otherwise, no large vessel occlusion or significant stenosis. Caliber of more distal right MCA branches is reduced relative to the left. Nondiagnostic perfusion imaging. Electronically Signed   By: Guadlupe Spanish M.D.   On: 01/20/2020 16:55   CT HEAD CODE STROKE WO CONTRAST  Result Date: 01/20/2020 CLINICAL DATA:  Code stroke.  Speech difficulty.  Aphasia. EXAM: CT HEAD WITHOUT CONTRAST TECHNIQUE: Contiguous axial images were obtained from the base of the skull through the vertex without intravenous contrast. COMPARISON:  None. FINDINGS: Brain: Moderate atrophy. Extensive chronic microvascular ischemic changes are present in the white matter and thalamus bilaterally. Negative for acute infarct, hemorrhage, mass. Vascular: Negative for hyperdense vessel Skull: Negative Sinuses/Orbits: Paranasal sinuses clear. Bilateral cataract extraction. Other: None ASPECTS (Alberta Stroke Program Early CT Score) - Ganglionic level infarction (caudate,  lentiform nuclei, internal capsule, insula, M1-M3 cortex): 7 - Supraganglionic infarction (M4-M6 cortex): 3 Total score (0-10 with 10 being normal): 10 IMPRESSION: 1. No acute abnormality. 2. Atrophy and extensive chronic microvascular ischemia 3. ASPECTS is 10 4. These results were called by telephone at the time of interpretation on 01/20/2020 at 4:22 pm to provider Aroor , who verbally acknowledged these results. Electronically Signed   By: Marlan Palau M.D.   On: 01/20/2020 16:23    EKG: Independently reviewed.  A flutter  Assessment/Plan Active  Problems:   CVA (cerebral vascular accident) (HCC)   AMS (altered mental status)  AMS Suspect stroke versus seizure, neurology on board EEG done result pending. CTA showing occlusion of the right ICA, however looks like chronic, neurology aware. Patient has history of chronic a flutter, on Xarelto, less likely to have ischemic stroke, will check lipid panel. Echocardiogram Speech evaluation, PT OT evaluation Allow permissive hypertension As needed Haldol MRI not available due to noncompatibility of her pacemaker  A flutter Change Xarelto to twice daily Lovenox Curbside consult with cardiology Dr. Mayford Knife who read patient's EKG and compared to her past EKG, and [employer] was contacted to investigate pacemaker, conclusion is a flutter is chronic and pacemaker working well (setting as 60 bpm to kick in)  Cyanosis This more looks like a central stenosis with right to left shunt Probably cor pulmonale, will order ABG, and check echo  HTN Allow permissive hypertension  Hypothyroidism Change to IV Synthroid for now    DVT prophylaxis: Lovenox twice daily  code Status: Full code, this was discussed with patient husband and 2 sons at bedside Family Communication: Husband and sons Disposition Plan: Probably will need rehab Consults called: Neurology and cardiology Admission status: PCU   Emeline General MD [facility] Pager [phone]    01/20/2020, 7:05 PM

## 2020-01-20 NOTE — Consult Note (Signed)
Neurology Consultation  Reason for Consult: Stroke Referring Physician: Dr. Eulis Foster  CC: Aphasia, right-sided neglect, confusion  History is obtained from: Nurse and chart  HPI: Crystal Espinoza is a 86 y.o. female with history of symptomatic bradycardia, severe pulmonary hypertension, paroxysmal atrial fibrillation on Xarelto and hypertension.  Patient was last seen normal at 12:00 midnight.  Apparently she woke at 12 PM and was confused.  Son called the house and noted at 2:30 PM she was showing expressive aphasia.  EMS was called later in the day and patient showed aphasia in route.  On exam initially prior to CT there is question of right-sided neglect.  As stated patient was brought immediately to CT to obtain a CTA head and neck along with perfusion to evaluate if intervention was capable.   ED course  Code stroke was called.  Patient was immediately brought back to CT.  CT of head, CTA of head and neck were obtained to evaluate for possible large vessel occlusion.  There is also a CT perfusion performed to evaluate for possible large or small core for possible intervention  Chart review no prior neurological notes  LKW: 12:00 midnight on 01/20/2020 tpa given?: no, on Xarelto and out of window Premorbid modified Rankin scale (mRS): 3 NIH stroke score: 12   Past Medical History:  Diagnosis Date  . Cataract   . CREST syndrome ([place])   . Hypertension   . Nausea   . Paroxysmal atrial fibrillation ([place]) 02/02/2017  . Severe pulmonary hypertension ([place])    on cath 02/2017  . Symptomatic bradycardia 04/08/2017    Family History  Problem Relation Age of Onset  . Heart disease Mother   . Stroke Father   . Stroke Sister   . Stroke Sister   . Cancer Sister   . Diabetes Sister    Social History:   reports that she has never smoked. She has never used smokeless tobacco. She reports that she does not drink alcohol or use drugs.  Medications  Current Facility-Administered  Medications:  .  iohexol (OMNIPAQUE) 350 MG/ML injection 100 mL, 100 mL, Intravenous, Once PRN, Avalin Briley, Lanice Schwab, MD  Current Outpatient Medications:  .  acetaminophen (TYLENOL) 325 MG tablet, Take 650 mg by mouth daily., Disp: , Rfl:  .  amLODipine (NORVASC) 5 MG tablet, Take 1 tablet (5 mg total) by mouth daily. *NEEDS MD VISIT FOR FURTHER REFILLS-PLEASE CALL TO SCHEDULE.*, Disp: 30 tablet, Rfl: 0 .  calcium citrate-vitamin D (CITRACAL+D) 315-200 MG-UNIT per tablet, Take 1 tablet by mouth 2 (two) times daily., Disp: , Rfl:  .  carvedilol (COREG) 6.25 MG tablet, TAKE 1 TABLET BY MOUTH TWICE DAILY, Disp: 60 tablet, Rfl: 6 .  Glucosamine-MSM-Hyaluronic Acd (JOINT HEALTH PO), Take 2,500 mg by mouth daily., Disp: , Rfl:  .  levothyroxine (SYNTHROID) 100 MCG tablet, Take 1 tablet by mouth daily., Disp: , Rfl:  .  Multiple Vitamin (MULTIVITAMIN) tablet, Take 1 tablet by mouth daily., Disp: , Rfl:  .  traMADol (ULTRAM) 50 MG tablet, Take 50 mg by mouth 2 (two) times daily., Disp: , Rfl:  .  XARELTO 20 MG TABS tablet, TAKE 1 TABLET(20 MG) BY MOUTH DAILY WITH SUPPER, Disp: 30 tablet, Rfl: 0  ROS:  Unable to obtain due to aphasia.   Exam: Current vital signs: BP (!) 180/83 (BP Location: Right Arm)   Pulse 67   Resp 18   SpO2 97%  Vital signs in last 24 hours: Pulse Rate:  [67] 67 (  04/08 1605) Resp:  [18] 18 (04/08 1605) BP: (180)/(83) 180/83 (04/08 1605) SpO2:  [97 %] 97 % (04/08 1605)   Constitutional: Appears well-developed and well-nourished.  Eyes: No scleral injection HENT: No OP obstrucion Head: Normocephalic.  Cardiovascular: Normal rate and regular rhythm.  Respiratory: Effort normal, non-labored breathing GI: Soft.  No distension. There is no tenderness.  Skin: WDI  Neuro: Mental Status: Patient is awake, alert, aphasic and unable to follow commands or express her self Cranial Nerves: II: Blinks to threat bilaterally III,IV, VI: PERRLA, able to cross midline but prefers  the left side. V: Facial sensation is intact VII: Right facial droop VIII: hearing is intact to voice XI: Shoulder shrug is symmetric. XII: tongue is midline without atrophy or fasciculations.  Motor: Moving all extremities antigravity Sensory: Withdraws to pain in all 4 extremities and localizes with the left hand to sternal rub.  Appears to be neglecting the right side Deep Tendon Reflexes: 2+ and symmetric in the biceps, no knee jerk on the right patella however she does have 2+ on the left patella Plantars: Toes are downgoing bilaterally.  Cerebellar: Unable to obtain finger-nose or heel-to-shin secondary to inability to understand commands  Labs I have reviewed labs in epic and the results pertinent to this consultation are:   CBC    Component Value Date/Time   WBC 5.5 04/09/2017 0638   RBC 4.07 04/09/2017 0638   HGB 15.0 01/20/2020 1607   HCT 44.0 01/20/2020 1607   PLT 107 (L) 04/09/2017 0638   MCV 98.0 04/09/2017 0638   MCV 99.3 (A) 10/29/2012 1731   MCH 31.9 04/09/2017 0638   MCHC 32.6 04/09/2017 0638   RDW 15.1 04/09/2017 0638   LYMPHSABS 0.6 (L) 04/08/2017 1016   MONOABS 0.3 04/08/2017 1016   EOSABS 0.0 04/08/2017 1016   BASOSABS 0.0 04/08/2017 1016    CMP     Component Value Date/Time   NA 136 01/20/2020 1607   NA 139 07/15/2018 1420   K 4.0 01/20/2020 1607   CL 103 01/20/2020 1607   CO2 24 07/15/2018 1420   GLUCOSE 109 (H) 01/20/2020 1607   BUN 12 01/20/2020 1607   BUN 10 07/15/2018 1420   CREATININE 0.80 01/20/2020 1607   CALCIUM 9.5 07/15/2018 1420   PROT 6.2 (L) 04/08/2017 1016   ALBUMIN 3.9 04/08/2017 1016   AST 21 04/08/2017 1016   ALT 12 (L) 04/08/2017 1016   ALKPHOS 89 04/08/2017 1016   BILITOT 1.2 04/08/2017 1016   GFRNONAA 82 07/15/2018 1420   GFRAA 95 07/15/2018 1420    Lipid Panel  No results found for: CHOL, TRIG, HDL, CHOLHDL, VLDL, LDLCALC, LDLDIRECT   Imaging I have reviewed the images obtained:  CT-scan of the brain-no  acute abnormality with aspects of 10  CTA head and neck along with perfusion-   Felicie Morn PA-C [facility] [phone]  M-F  (9:00 am- 5:00 PM)  01/20/2020, 4:23 PM     Assessment:  This is a 86 year old female who went to sleep at approximately 0000 hours on 01/20/2020.  Patient woke up at approximately 12 PM which is unusual for her.  At that time she appeared confused.  Son noticed that to 30 she was especially aphasic.  Patient was brought in by EMS and code stroke was called..  On exam patient is moving all extremities but is definitely aphasic and appears to be neglecting the right side.  CTA head and neck along with perfusion were obtained-negative for LVO, has  chronic right ICA occlusion with reconstitution intracranially.  CTP nondiagnostic  Stat EEG was obtained: Patient showed left hemispheric slowing with sharps.  We will start patient on Keppra 500 mg twice daily.  Impression: -Aphasia -Right-sided neglect --Differential includes stroke versus seizure  Recommendations --Loaded with Keppra 1g and start 500 mg twice daily for maintenance -MRI of the brain without contrast then -At this point would continue Xarelto  -Start or continue Atorvastatin 40 mg/other high intensity statin --BP goal: permisive HTn upto 220/110 mmHg -HBAIC and Lipid profile -Telemetry monitoring -Frequent neuro checks -NPO until passes stroke swallow screen -PT/OT # please page stroke NP  Or  PA  Or MD from 8am -4 pm  as this patient from this time will be  followed by the stroke.   You can look them up on [email or web address]  Password [email or web address]  NEUROHOSPITALIST ADDENDUM Performed a face to face diagnostic evaluation.   I have reviewed the contents of history and physical exam as documented by PA/ARNP/Resident and agree with above documentation.  I have discussed and formulated the above plan as documented. Edits to the note have been made as needed.  Patient with history of A. fib on  Xarelto, hypertension, bradycardia last known normal 12 PM, but acutely confused when son called the house at 2:30 PM.  EMS brought the patient, but not activated as a code stroke.  Patient noted to have aphasia and possible right-sided neglect by EDP and code stroke activated-stat CT head showed no hemorrhage. CT angiogram was performed to look for LVO-showed chronic right ICA occlusion in the cervical ICA with reconstitution intracranially.  CT perfusion was nondiagnostic, no clear penumbra visualized.  On my examination, patient nonverbal.  Not following any commands but moving all 4 extremities equally.  Tended to prefer the left side, but no gaze deviation/cross midline.  As CTA was not convincing and patient still having significant altered mental status and neglect, stat EEG was performed which showed left-sided slowing with sharps.  Will load with Keppra 1 g and start 500 twice daily.  Also will obtain MRI brain to evaluate for acute stroke.  Not a candidate for TPA as patient on Xarelto and outside window.  Patient afebrile, no evidence of neck stiffness so low suspicion for encephalitis.  Also patient on anticoagulation so deferred LP  Recommend hospital admission.  We will continue to follow patient    Arther Dames MD [facility] [phone]   If 7pm to 7am, please call on call as listed on AMION.

## 2020-01-20 NOTE — Code Documentation (Signed)
Patient's last known well was midnight on 01/20/20. She woke up at noon. Son called at 1430 and noted speech difficulty and confusion. He called 911. [facility] brought pt to ED and pt's symptoms worsened on arrival with weakness, aphasia, and inability to follow commands. ED activated a code stroke. NIHSS 17 due to inability to follow commands, aphasia, generalized weakness, and right facial droop. Pt taken for CT head/CTA/CTP.  No signs of LVO on scans. No TPA given r/t outside the window. Family at the bedside. Care Plan: EEG, Q2 vitals and mNIHSS for 12 hours. Handoff with bedside RN Magnus Ivan. Detrice Cales, [place] Scrape, RN, BSN, [employer]

## 2020-01-20 NOTE — Progress Notes (Signed)
EEG complete - results pending 

## 2020-01-20 NOTE — Progress Notes (Signed)
LTM  New leads used

## 2020-01-20 NOTE — ED Provider Notes (Signed)
[facility] EMERGENCY DEPARTMENT Provider Note   CSN: [id number] Arrival date & time: 01/20/20  1556     History Chief Complaint  Patient presents with  . Code Stroke    Crystal Espinoza is a 86 y.o. female.  Per [employer] they were called out for confusion/altered mental status that began after patient woke up this morning around 9 AM, last seen normal at midnight last night.  Per EMS patient was speaking normally with them in route and following commands.  EMS reports that shortly after checking in at the bridge patient developed aphasia and right-sided neglect which was new within the last few minutes.  Level 5 caveat altered mental status HPI     Past Medical History:  Diagnosis Date  . Cataract   . CREST syndrome (HCC)   . Hypertension   . Nausea   . Paroxysmal atrial fibrillation (HCC) 02/02/2017  . Severe pulmonary hypertension (HCC)    on cath 02/2017  . Symptomatic bradycardia 04/08/2017    Patient Active Problem List   Diagnosis Date Noted  . CVA (cerebral vascular accident) (HCC) 01/20/2020  . AMS (altered mental status) 01/20/2020  . Pacemaker 06/30/2019  . Symptomatic bradycardia 04/08/2017  . Paroxysmal atrial fibrillation (HCC) 02/02/2017  . RBBB 02/27/2014  . PAC (premature atrial contraction) 02/27/2014  . CREST syndrome (HCC) 02/27/2014    Past Surgical History:  Procedure Laterality Date  . ABDOMINAL HYSTERECTOMY    . EYE SURGERY    . PACEMAKER IMPLANT N/A 04/08/2017   Procedure: Pacemaker Implant;  Surgeon: Marinus Maw, MD;  Location: [facility] INVASIVE CV LAB;  Service: Cardiovascular;  Laterality: N/A;     OB History   No obstetric history on file.     Family History  Problem Relation Age of Onset  . Heart disease Mother   . Stroke Father   . Stroke Sister   . Stroke Sister   . Cancer Sister   . Diabetes Sister     Social History   Tobacco Use  . Smoking status: Never Smoker  . Smokeless tobacco:  Never Used  Substance Use Topics  . Alcohol use: No  . Drug use: No    Home Medications Prior to Admission medications   Medication Sig Start Date End Date Taking? Authorizing Provider  amLODipine (NORVASC) 5 MG tablet Take 1 tablet (5 mg total) by mouth daily. *NEEDS MD VISIT FOR FURTHER REFILLS-PLEASE CALL TO SCHEDULE.* 08/11/19  Yes Chilton Si, MD  calcium citrate-vitamin D (CITRACAL+D) 315-200 MG-UNIT per tablet Take 1 tablet by mouth 2 (two) times daily.   Yes [provider]  Glucosamine-MSM-Hyaluronic Acd (JOINT HEALTH PO) Take 2,500 mg by mouth daily.   Yes [provider]  levothyroxine (SYNTHROID) 100 MCG tablet Take 100 mcg by mouth daily.    Yes [provider]  Multiple Vitamin (MULTIVITAMIN) tablet Take 1 tablet by mouth daily.   Yes [provider]  XARELTO 20 MG TABS tablet TAKE 1 TABLET(20 MG) BY MOUTH DAILY WITH SUPPER Patient taking differently: Take 20 mg by mouth daily.  12/31/19  Yes Chilton Si, MD  carvedilol (COREG) 6.25 MG tablet TAKE 1 TABLET BY MOUTH TWICE DAILY Patient not taking: Reported on 01/20/2020 10/23/18   Chilton Si, MD    Allergies    Levaquin [levofloxacin]  Review of Systems   Review of Systems  Unable to perform ROS: Acuity of condition    Physical Exam Updated Vital Signs BP (!) 173/95  Pulse 77   Temp 97.8 F (36.6 C) (Oral)   Resp (!) 33   Ht 5\' 6"  (1.676 m)   Wt 54.7 kg   SpO2 100%   BMI 19.46 kg/m   Physical Exam Constitutional:      General: She is not in acute distress.    Appearance: Normal appearance. She is well-developed. She is not ill-appearing or diaphoretic.  HENT:     Head: Normocephalic and atraumatic.     Right Ear: External ear normal.     Left Ear: External ear normal.     Nose: Nose normal.  Eyes:     General: Vision grossly intact. Gaze aligned appropriately.     Pupils: Pupils are equal, round, and reactive to light.  Neck:     Trachea: Trachea  and phonation normal. No tracheal deviation.  Pulmonary:     Effort: Pulmonary effort is normal. No respiratory distress.  Abdominal:     General: There is no distension.     Palpations: Abdomen is soft.     Tenderness: There is no abdominal tenderness. There is no guarding or rebound.  Musculoskeletal:        General: Normal range of motion.     Cervical back: Normal range of motion.  Skin:    General: Skin is warm and dry.  Neurological:     Mental Status: She is alert.     GCS: GCS eye subscore is 4. GCS verbal subscore is 5. GCS motor subscore is 6.     Comments: Alert, garbled speech, right-sided neglect Major Cranial nerves without deficit, no facial droop No apparent drift upper extremities, weak but equal grip strength  Psychiatric:        Behavior: Behavior normal.     ED Results / Procedures / Treatments   Labs (all labs ordered are listed, but only abnormal results are displayed) Labs Reviewed  CBC - Abnormal; Notable for the following components:      Result Value   HCT 46.4 (*)    MCV 107.9 (*)    RDW 15.8 (*)    Platelets 123 (*)    All other components within normal limits  COMPREHENSIVE METABOLIC PANEL - Abnormal; Notable for the following components:   CO2 20 (*)    Glucose, Bld 111 (*)    Total Bilirubin 1.3 (*)    All other components within normal limits  URINALYSIS, ROUTINE W REFLEX MICROSCOPIC - Abnormal; Notable for the following components:   Hgb urine dipstick SMALL (*)    Ketones, ur 5 (*)    Leukocytes,Ua TRACE (*)    All other components within normal limits  I-STAT CHEM 8, ED - Abnormal; Notable for the following components:   Glucose, Bld 109 (*)    All other components within normal limits  CBG MONITORING, ED - Abnormal; Notable for the following components:   Glucose-Capillary 105 (*)    All other components within normal limits  RESPIRATORY PANEL BY RT PCR (FLU A&B, COVID)  PROTIME-INR  APTT  DIFFERENTIAL  RAPID URINE DRUG SCREEN,  HOSP PERFORMED  ETHANOL  HEMOGLOBIN A1C  LIPID PANEL  CBC WITH DIFFERENTIAL/PLATELET  BLOOD GAS, ARTERIAL    EKG EKG Interpretation  Date/Time:  Thursday January 20 2020 16:54:34 EDT Ventricular Rate:  60 PR Interval:    QRS Duration: 134 QT Interval:  473 QTC Calculation: 473 R Axis:   -170 Text Interpretation: indeterminate rhythym Nonspecific intraventricular conduction delay Lateral infarct, old Artifact Serial tracing suggested Confirmed  by Mancel BaleWentz, Elliott [phone]([id number]) on 01/20/2020 5:09:25 PM   Radiology CT ANGIO HEAD W OR WO CONTRAST  Result Date: 01/20/2020 CLINICAL DATA:  Code stroke follow-up EXAM: CT ANGIOGRAPHY HEAD AND NECK CT PERFUSION BRAIN TECHNIQUE: Multidetector CT imaging of the head and neck was performed using the standard protocol during bolus administration of intravenous contrast. Multiplanar CT image reconstructions and MIPs were obtained to evaluate the vascular anatomy. Carotid stenosis measurements (when applicable) are obtained utilizing NASCET criteria, using the distal internal carotid diameter as the denominator. Multiphase CT imaging of the brain was performed following IV bolus contrast injection. Subsequent parametric perfusion maps were calculated using RAPID software. CONTRAST:  100mL OMNIPAQUE IOHEXOL 350 MG/ML SOLN COMPARISON:  None. FINDINGS: CTA NECK FINDINGS Aortic arch: Mild to moderate calcified plaque along the arch. Great vessel origins are patent. Right carotid system: Common and external carotid arteries are patent. Calcified plaque at the bifurcation. There is noncalcified plaque with occlusion of the cervical ICA just beyond the origin. No reconstitution in the neck. Left carotid system: Patent. Mild calcified plaque at the bifurcation without measurable stenosis. Vertebral arteries: Patent.  Codominant. Skeleton: Degenerative changes of the cervical spine. Other neck: No mass or adenopathy. Upper chest: No apical lung mass. Review of the MIP images  confirms the above findings CTA HEAD FINDINGS Anterior circulation: Intracranial right internal carotid is occluded with reconstitution at the level of the communicating segment. Left intracranial internal carotid arteries patent with calcified plaque but no significant stenosis. Anterior and middle cerebral arteries are patent. Posterior circulation: Intracranial vertebral arteries are patent. There is calcified plaque causing moderate stenosis on the left. Basilar artery is patent. Posterior cerebral arteries are patent. Bilateral posterior communicating arteries are present. Venous sinuses: Not well evaluated Review of the MIP images confirms the above findings CT Brain Perfusion Findings: Nondiagnostic due to motion artifact. 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 This is age-indeterminate but probably chronic. Otherwise, no large vessel occlusion or significant stenosis. Caliber of more distal right MCA branches is reduced relative to the left. Nondiagnostic perfusion imaging. Electronically Signed   By: Guadlupe SpanishPraneil  Patel M.D.   On: 01/20/2020 16:55   CT ANGIO NECK W OR WO CONTRAST  Result Date: 01/20/2020 CLINICAL DATA:  Code stroke follow-up EXAM: CT ANGIOGRAPHY HEAD AND NECK CT PERFUSION BRAIN TECHNIQUE: Multidetector CT imaging of the head and neck was performed using the standard protocol during bolus administration of intravenous contrast. Multiplanar CT image reconstructions and MIPs were obtained to evaluate the vascular anatomy. Carotid stenosis measurements (when applicable) are obtained utilizing NASCET criteria, using the distal internal carotid diameter as the denominator. Multiphase CT imaging of the brain was performed following IV bolus contrast injection. Subsequent parametric perfusion maps were calculated using RAPID software. CONTRAST:  100mL OMNIPAQUE IOHEXOL 350 MG/ML SOLN COMPARISON:  None.  FINDINGS: CTA NECK FINDINGS Aortic arch: Mild to moderate calcified plaque along the arch. Great vessel origins are patent. Right carotid system: Common and external carotid arteries are patent. Calcified plaque at the bifurcation. There is noncalcified plaque with occlusion of the cervical ICA just beyond the origin. No reconstitution in the neck. Left carotid system: Patent. Mild calcified plaque at the bifurcation without measurable stenosis. Vertebral arteries: Patent.  Codominant. Skeleton: Degenerative changes of the cervical spine. Other neck: No mass or adenopathy. Upper chest: No apical lung mass. Review of the MIP images confirms the above findings CTA HEAD FINDINGS Anterior circulation: Intracranial  right internal carotid is occluded with reconstitution at the level of the communicating segment. Left intracranial internal carotid arteries patent with calcified plaque but no significant stenosis. Anterior and middle cerebral arteries are patent. Posterior circulation: Intracranial vertebral arteries are patent. There is calcified plaque causing moderate stenosis on the left. Basilar artery is patent. Posterior cerebral arteries are patent. Bilateral posterior communicating arteries are present. Venous sinuses: Not well evaluated Review of the MIP images confirms the above findings CT Brain Perfusion Findings: Nondiagnostic due to motion artifact. 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 This is age-indeterminate but probably chronic. Otherwise, no large vessel occlusion or significant stenosis. Caliber of more distal right MCA branches is reduced relative to the left. Nondiagnostic perfusion imaging. Electronically Signed   By: Guadlupe Spanish M.D.   On: 01/20/2020 16:55   CT CEREBRAL PERFUSION W CONTRAST  Result Date: 01/20/2020 CLINICAL DATA:  Code stroke follow-up EXAM: CT ANGIOGRAPHY HEAD AND NECK CT  PERFUSION BRAIN TECHNIQUE: Multidetector CT imaging of the head and neck was performed using the standard protocol during bolus administration of intravenous contrast. Multiplanar CT image reconstructions and MIPs were obtained to evaluate the vascular anatomy. Carotid stenosis measurements (when applicable) are obtained utilizing NASCET criteria, using the distal internal carotid diameter as the denominator. Multiphase CT imaging of the brain was performed following IV bolus contrast injection. Subsequent parametric perfusion maps were calculated using RAPID software. CONTRAST:  OMNIPAQUE IOHEXOL 350 MG/ML SOLN COMPARISON:  None. FINDINGS: CTA NECK FINDINGS Aortic arch: Mild to moderate calcified plaque along the arch. Great vessel origins are patent. Right carotid system: Common and external carotid arteries are patent. Calcified plaque at the bifurcation. There is noncalcified plaque with occlusion of the cervical ICA just beyond the origin. No reconstitution in the neck. Left carotid system: Patent. Mild calcified plaque at the bifurcation without measurable stenosis. Vertebral arteries: Patent.  Codominant. Skeleton: Degenerative changes of the cervical spine. Other neck: No mass or adenopathy. Upper chest: No apical lung mass. Review of the MIP images confirms the above findings CTA HEAD FINDINGS Anterior circulation: Intracranial right internal carotid is occluded with reconstitution at the level of the communicating segment. Left intracranial internal carotid arteries patent with calcified plaque but no significant stenosis. Anterior and middle cerebral arteries are patent. Posterior circulation: Intracranial vertebral arteries are patent. There is calcified plaque causing moderate stenosis on the left. Basilar artery is patent. Posterior cerebral arteries are patent. Bilateral posterior communicating arteries are present. Venous sinuses: Not well evaluated Review of the MIP images confirms the above  findings CT Brain Perfusion Findings: Nondiagnostic due to motion artifact. 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 This is age-indeterminate but probably chronic. Otherwise, no large vessel occlusion or significant stenosis. Caliber of more distal right MCA branches is reduced relative to the left. Nondiagnostic perfusion imaging. Electronically Signed   By: Guadlupe Spanish M.D.   On: 01/20/2020 16:55   CT HEAD CODE STROKE WO CONTRAST  Result Date: 01/20/2020 CLINICAL DATA:  Code stroke.  Speech difficulty.  Aphasia. EXAM: CT HEAD WITHOUT CONTRAST TECHNIQUE: Contiguous axial images were obtained from the base of the skull through the vertex without intravenous contrast. COMPARISON:  None. FINDINGS: Brain: Moderate atrophy. Extensive chronic microvascular ischemic changes are present in the white matter and thalamus bilaterally. Negative for acute infarct, hemorrhage, mass. Vascular: Negative for hyperdense vessel Skull: Negative  Sinuses/Orbits: Paranasal sinuses clear. Bilateral cataract extraction. Other: None ASPECTS (Alberta Stroke Program Early CT Score) - Ganglionic level infarction (caudate, lentiform nuclei, internal capsule, insula, M1-M3 cortex): 7 - Supraganglionic infarction (M4-M6 cortex): 3 Total score (0-10 with 10 being normal): 10 IMPRESSION: 1. No acute abnormality. 2. Atrophy and extensive chronic microvascular ischemia 3. ASPECTS is 10 4. These results were called by telephone at the time of interpretation on 01/20/2020 at 4:22 pm to provider Aroor , who verbally acknowledged these results. Electronically Signed   By: Marlan Palau M.D.   On: 01/20/2020 16:23    Procedures .Critical Care Performed by: Bill Salinas, PA-C Authorized by: Bill Salinas, PA-C   Critical care provider statement:    Critical care time (minutes):  46   Critical care was necessary to treat or prevent  imminent or life-threatening deterioration of the following conditions:  CNS failure or compromise   Critical care was time spent personally by me on the following activities:  Discussions with consultants, evaluation of patient's response to treatment, examination of patient, ordering and performing treatments and interventions, ordering and review of laboratory studies, ordering and review of radiographic studies, pulse oximetry, re-evaluation of patient's condition, obtaining history from patient or surrogate, review of old charts and development of treatment plan with patient or surrogate   (including critical care time)  Medications Ordered in ED Medications  haloperidol lactate (HALDOL) injection 1 mg (has no administration in time range)  levothyroxine (SYNTHROID, LEVOTHROID) injection 50 mcg (has no administration in time range)  enoxaparin (LOVENOX) injection 50 mg (has no administration in time range)  0.9 %  sodium chloride infusion (has no administration in time range)  labetalol (NORMODYNE) injection 5 mg (has no administration in time range)   stroke: mapping our early stages of recovery book (has no administration in time range)  acetaminophen (TYLENOL) tablet 650 mg (has no administration in time range)    Or  acetaminophen (TYLENOL) 160 MG/5ML solution 650 mg (has no administration in time range)    Or  acetaminophen (TYLENOL) suppository 650 mg (has no administration in time range)  iohexol (OMNIPAQUE) 350 MG/ML injection 100 mL (100 mLs Intravenous Contrast Given 01/20/20 1634)  LORazepam (ATIVAN) injection 0.5 mg (0.5 mg Intravenous Given 01/20/20 1730)    ED Course  I have reviewed the triage vital signs and the nursing notes.  Pertinent labs & imaging results that were available during my care of the patient were reviewed by me and considered in my medical decision making (see chart for details).  Clinical Course as of Jan 20 1908  Thu Jan 20, 2020  1826 Dr. Chipper Herb    [BM]    Clinical Course User Index [BM] Elizabeth Palau   MDM Rules/Calculators/A&P                     86 year old female with history as detailed above presents today for initially confusion began this morning after she woke up.  Per EMS patient was having normal conversation following commands well until they checked in here at the ER when patient suddenly developed aphasia and right-sided neglect.  On my evaluation patient does have garbled speech neglecting right side, no clear drift or weakness however patient is unable to follow commands due to confusion.  Code stroke initiated, patient seen and evaluated by Dr. Effie Shy. - CT Head:  IMPRESSION:  1. No acute abnormality.  2. Atrophy and extensive chronic microvascular ischemia  3. ASPECTS is  10  4. These results were called by telephone at the time of  interpretation on 01/20/2020 at 4:22 pm to provider Aroor , who  verbally acknowledged these results.  I personally reviewed patient's CT head, per my interpretation no obvious intracranial hemorrhage, agree with radiologist interpretation.  Blood work ordered, reviewed and interpreted by me.  CBC shows no evidence of anemia or leukocytosis to suggest infection.  PT/INR and APTT within normal limits.  CMP shows no emergent electrolyte derangement, evidence of kidney injury or acute elevation of LFTs.  CBG 105 no evidence of hypoglycemia.  UDS negative no evidence of drug intoxication.  Covid/flu panel negative.  Urinalysis shows small ketones hemoglobin and leukocytes does not appear overtly infected doubt UTI. - Supplemental history was obtained from patient's son Elta Guadeloupe who is at bedside he reports that patient woke up around noon today, last seen normal was indeed midnight last night.  He reports that since she woke up patient has had incoherent speech and confusion and this was not new upon ED arrival. - Patient reevaluated she is agitated and not cooperating with staff members who  are attempting to perform an EEG.  Will give 0.5 mg Ativan to help calm patient and continue monitoring. - 5:50 PM: I discussed the case via phone with Dr. Lorraine Lax who asked for MRI of the brain w/o contrast and admission to hospitalist service. - Patient's chart was reviewed, patient has a [employer] pacemaker.  I then discussed with MRI technician who advised that this is not safe for MRI, MRI was canceled. - 6:26 PM: Discussed the case with Dr. Roosevelt Locks who has accepted patient for admission.  Note: Portions of this report may have been transcribed using voice recognition software. Every effort was made to ensure accuracy; however, inadvertent computerized transcription errors may still be present. .Final Clinical Impression(s) / ED Diagnoses Final diagnoses:  Altered mental status, unspecified altered mental status type    Rx / DC Orders ED Discharge Orders    None       Gari Crown 01/20/20 1911    Daleen Bo, MD 01/20/20 2151

## 2020-01-20 NOTE — Progress Notes (Signed)
VLTM started  Neurology notified.  Event button tested not functioning on this unit.  Baseline study done earlier today

## 2020-01-20 NOTE — ED Triage Notes (Signed)
Arrived via EMS; c/o confusion upon waking up this AM around 0930; reported last seen normal when she went to bed at midnight. Family reports patient is normally A&Ox4.

## 2020-01-20 NOTE — Progress Notes (Signed)
Admission doc not complete d/t pt not being a good historian.

## 2020-01-20 NOTE — ED Provider Notes (Signed)
  Face-to-face evaluation   History: She presents by EMS, initial call out for altered mental status.  On arrival to this facility she began to have trouble talking.  I evaluated the patient for 4:01 PM.  Physical exam: Alert, responsive and cooperative.  Right-sided neglect, eyes do not cross midline, and she does not notice examiner on the right side.  Able to maintain arms and legs off stretcher, independently.  No facial asymmetry.  Speech garbled, random words, in response to questions.  Medical screening examination/treatment/procedure(s) were conducted as a shared visit with non-physician practitioner(s) and myself.  I personally evaluated the patient during the encounter    Mancel Bale, MD 01/20/20 2151

## 2020-01-21 ENCOUNTER — Inpatient Hospital Stay (HOSPITAL_COMMUNITY): Payer: Medicare Other

## 2020-01-21 DIAGNOSIS — R569 Unspecified convulsions: Principal | ICD-10-CM

## 2020-01-21 DIAGNOSIS — G9341 Metabolic encephalopathy: Secondary | ICD-10-CM

## 2020-01-21 DIAGNOSIS — I6389 Other cerebral infarction: Secondary | ICD-10-CM

## 2020-01-21 LAB — CBC WITH DIFFERENTIAL/PLATELET
Abs Immature Granulocytes: 0.02 10*3/uL (ref 0.00–0.07)
Basophils Absolute: 0 10*3/uL (ref 0.0–0.1)
Basophils Relative: 0 %
Eosinophils Absolute: 0 10*3/uL (ref 0.0–0.5)
Eosinophils Relative: 0 %
HCT: 42.8 % (ref 36.0–46.0)
Hemoglobin: 13.9 g/dL (ref 12.0–15.0)
Immature Granulocytes: 0 %
Lymphocytes Relative: 19 %
Lymphs Abs: 1.2 10*3/uL (ref 0.7–4.0)
MCH: 34.5 pg — ABNORMAL HIGH (ref 26.0–34.0)
MCHC: 32.5 g/dL (ref 30.0–36.0)
MCV: 106.2 fL — ABNORMAL HIGH (ref 80.0–100.0)
Monocytes Absolute: 0.5 10*3/uL (ref 0.1–1.0)
Monocytes Relative: 8 %
Neutro Abs: 4.3 10*3/uL (ref 1.7–7.7)
Neutrophils Relative %: 73 %
Platelets: 129 10*3/uL — ABNORMAL LOW (ref 150–400)
RBC: 4.03 MIL/uL (ref 3.87–5.11)
RDW: 15.7 % — ABNORMAL HIGH (ref 11.5–15.5)
WBC: 6 10*3/uL (ref 4.0–10.5)
nRBC: 0 % (ref 0.0–0.2)

## 2020-01-21 LAB — LIPID PANEL
Cholesterol: 195 mg/dL (ref 0–200)
HDL: 50 mg/dL (ref >40)
LDL Cholesterol: 134 mg/dL — ABNORMAL HIGH (ref 0–99)
Total CHOL/HDL Ratio: 3.9 RATIO
Triglycerides: 55 mg/dL (ref <150)
VLDL: 11 mg/dL (ref 0–40)

## 2020-01-21 LAB — ECHOCARDIOGRAM COMPLETE
Height: 66 in
Weight: 1929.47 oz

## 2020-01-21 MED ORDER — SODIUM CHLORIDE 0.9 % IV SOLN: INTRAVENOUS | Status: AC

## 2020-01-21 MED ORDER — ATORVASTATIN CALCIUM 40 MG PO TABS
40.0000 mg | ORAL_TABLET | Freq: Every day | ORAL | Status: DC
  Administered 2020-01-22 – 2020-01-24 (×3): 40 mg via ORAL
  Filled 2020-01-21 (×4): qty 1

## 2020-01-21 MED ORDER — ENOXAPARIN SODIUM 60 MG/0.6ML ~~LOC~~ SOLN
55.0000 mg | Freq: Two times a day (BID) | SUBCUTANEOUS | Status: DC
  Administered 2020-01-21 – 2020-01-22 (×3): 55 mg via SUBCUTANEOUS
  Filled 2020-01-21 (×3): qty 0.6

## 2020-01-21 NOTE — Progress Notes (Signed)
Pt has 2 keppra dose due at 0700 and 0730. [place] inquired about my concern of this order.

## 2020-01-21 NOTE — Plan of Care (Signed)
  Problem: Education: Goal: Knowledge of disease or condition will improve Outcome: Progressing Goal: Knowledge of secondary prevention will improve Outcome: Progressing Goal: Knowledge of patient specific risk factors addressed and post discharge goals established will improve Outcome: Progressing Goal: Individualized Educational Video(s) Outcome: Progressing   

## 2020-01-21 NOTE — Progress Notes (Signed)
Echocardiogram 2D Echocardiogram has been performed.  Crystal Espinoza 01/21/2020, 12:08 PM

## 2020-01-21 NOTE — Progress Notes (Signed)
Delayed note take down in am. LTM EEG discontinued - no skin breakdown at [facility].

## 2020-01-21 NOTE — Progress Notes (Signed)
PROGRESS NOTE   Crystal Espinoza  ZWC:[id number]    DOB: Oct 05, 1929    DOA: 01/20/2020  PCP: Merri Brunette, MD   I have briefly reviewed patients previous medical records in [facility].  Chief Complaint:   Chief Complaint  Patient presents with  . Code Stroke    Brief Narrative:  86 year old female with PMH of paroxysmal A. fib/atrial flutter on Xarelto, symptomatic bradycardia s/p PPM, severe pulmonary hypertension, hypertension, presented to the ED due to confusion, expressive aphasia and possible right-sided neglect.  Evaluated by neurology in ED.  Admitted for suspected acute stroke versus seizure.   Assessment & Plan:  Active Problems:   CVA (cerebral vascular accident) (HCC)   AMS (altered mental status)   Seizure (HCC)   New onset seizure, unprovoked: CT head code stroke: No acute abnormality.  Atrophy and extensive chronic microvascular ischemia.  CTA head and neck and CTP: 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,  Probably chronic.  Otherwise no large vessel occlusion or significant stenosis.  Nondiagnostic perfusion imaging.  Unable to perform MRI due to PPM incompatibility.  Stat EEG showed sharp waves in left frontal region as well as slowing in the left hemisphere.  As per neurology, presentation of aphasia, possible right-sided neglect with normal CT head most likely suggestive of seizures.  No features suggestive of infectious causes as precipitant.  Hypertensive with BP 180/83 on arrival but no evidence of PR ES on CT head.  Low index of suspicion for meningitis/encephalitis.  Patient was loaded with Keppra, continue Keppra 500 mg twice daily.  I discussed in detail with Dr. Melynda Ripple, Neurology who feels that this was likely not a stroke.  Unable to do MRI.  Even if stroke confirmed, would not change management.  She is on Xarelto for anticoagulation, atorvastatin 40 mg daily started for  hyperlipidemia.  Seizure precautions.  Control blood pressure.  Outpatient follow-up with neurology in 8 to 12 weeks after discharge.  Patient still NPO, failed bedside RN swallow evaluation, ST swallow evaluation pending.  Acute encephalopathy: Secondary to postictal phase.  Improving.  Atrial flutter: Currently NPO pending swallow evaluation.  Xarelto changed to Lovenox on admission.  After she passes swallow evaluation, consider resuming Xarelto.  Admitting [facility] MD curbside consulted cardiology who read patient's EKG and compared to her past EKG, and [employer] was contacted to investigate pacemaker, conclusion is atrial flutter is chronic and pacemaker working well (setting as 60 bpm to cocaine).  Essential hypertension: Reasonable inpatient control.  Resume home medications when passes swallow.  Hypothyroidism: Synthroid changed to IV while NPO.  Cyanosis: Present on admission.  Looks more central with right to left shunt.  Probably cor pulmonale.  pH 7.436, PCO2 29.7, PO2 92, bicarbonate 21 and oxygen saturation 98%.:  LVEF 45-50% no regional wall motion abnormalities.  Mild LVH.  Body mass index is 19.46 kg/m.   Thrombocytopenia: Follow CBC.  LLL opacity on chest x-ray: No clinical pneumonia.  Afebrile, no leukocytosis.  Monitor off of antibiotics.  Follow chest x-ray in 4 weeks.   DVT prophylaxis: Lovenox Code Status: Full Family Communication: None at bedside Disposition:  . Patient came from: Home           . Anticipated d/c place: SNF . Barriers to d/c: Pending further improvement in mental status changes, passing swallow eval and no further seizures.  SNF bed and insurance   Consultants:   Neurology  Procedures:  None  Antimicrobials:   None   Subjective:  When I saw the patient early this morning, patient was lethargic, alert and oriented only to self, low tone voice, stated that she was thirsty.  Followed simple instructions.  However since then upon my  discussion with neurology who saw her later, mental status much improved, more oriented and cooperating with questions.  Objective:   Vitals:   01/21/20 0752 01/21/20 1354 01/21/20 1355 01/21/20 1625  BP:   (!) 141/74 (!) 146/81  Pulse:   (!) 59 60  Resp: (!) 23 20 20 20   Temp:   (!) 96.9 F (36.1 C) (!) 97.1 F (36.2 C)  TempSrc:   Axillary Axillary  SpO2: 100% 97% 97% 92%  Weight:      Height:        General exam: Elderly female, moderately built and frail, lying comfortably propped up in bed.  Oral mucosa dry. Respiratory system: Clear to auscultation. Respiratory effort normal. Cardiovascular system: S1 & S2 heard, RRR. No JVD, murmurs, rubs, gallops or clicks. No pedal edema.  Telemetry personally reviewed: Atrial flutter with controlled ventricular rate and on demand ventricular pacing. Gastrointestinal system: Abdomen is nondistended, soft and nontender. No organomegaly or masses felt. Normal bowel sounds heard. Central nervous system: Mental status as noted above. No focal neurological deficits. Extremities: Symmetric 5 x 5 power.  Tip of fingers and toes somewhat cyanotic. Skin: No rashes, lesions or ulcers Psychiatry: Judgement and insight impaired. Mood & affect cannot be assessed.     Data Reviewed:   I have personally reviewed following labs and imaging studies   CBC: Recent Labs  Lab 01/20/20 1558 01/20/20 1558 01/20/20 1607 01/20/20 1937 01/21/20 0341  WBC 4.2  --   --   --  6.0  NEUTROABS 3.1  --   --   --  4.3  HGB 14.5   < > 15.0 16.0* 13.9  HCT 46.4*   < > 44.0 47.0* 42.8  MCV 107.9*  --   --   --  106.2*  PLT 123*  --   --   --  129*   < > = values in this interval not displayed.    Basic Metabolic Panel: Recent Labs  Lab 01/20/20 1558 01/20/20 1607 01/20/20 1937  NA 138 136 135  K 4.3 4.0 4.3  CL 104 103  --   CO2 20*  --   --   GLUCOSE 111* 109*  --   BUN 11 12  --   CREATININE 0.80 0.80  --   CALCIUM 9.1  --   --     Liver  Function Tests: Recent Labs  Lab 01/20/20 1558  AST 22  ALT 13  ALKPHOS 104  BILITOT 1.3*  PROT 6.5  ALBUMIN 3.8    CBG: Recent Labs  Lab 01/20/20 1602  GLUCAP 105*    Microbiology Studies:   Recent Results (from the past 240 hour(s))  Respiratory Panel by RT PCR (Flu A&B, Covid) - Nasopharyngeal Swab     Status: None   Collection Time: 01/20/20  4:35 PM   Specimen: Nasopharyngeal Swab  Result Value Ref Range Status   SARS Coronavirus 2 by RT PCR NEGATIVE NEGATIVE Final    Comment: (NOTE) SARS-CoV-2 target nucleic acids are NOT DETECTED. The SARS-CoV-2 RNA is generally detectable in upper respiratoy specimens during the acute phase of infection. The lowest concentration of SARS-CoV-2 viral copies this assay can detect is 131 copies/mL. A negative result does  not preclude SARS-Cov-2 infection and should not be used as the sole basis for treatment or other patient management decisions. A negative result may occur with  improper specimen collection/handling, submission of specimen other than nasopharyngeal swab, presence of viral mutation(s) within the areas targeted by this assay, and inadequate number of viral copies (<131 copies/mL). A negative result must be combined with clinical observations, patient history, and epidemiological information. The expected result is Negative. Fact Sheet for Patients:  https://www.moore.com/https://www.fda.gov/media/142436/download Fact Sheet for Healthcare Providers:  https://www.young.biz/https://www.fda.gov/media/142435/download This test is not yet ap proved or cleared by the [place] FDA and  has been authorized for detection and/or diagnosis of SARS-CoV-2 by FDA under an Emergency Use Authorization (EUA). This EUA will remain  in effect (meaning this test can be used) for the duration of the COVID-19 declaration under Section 564(b)(1) of the Act, 21 U.S.C. section 360bbb-3(b)(1), unless the authorization is terminated or revoked sooner.    Influenza A by PCR  NEGATIVE NEGATIVE Final   Influenza B by PCR NEGATIVE NEGATIVE Final    Comment: (NOTE) The Xpert Xpress SARS-CoV-2/FLU/RSV assay is intended as an aid in  the diagnosis of influenza from Nasopharyngeal swab specimens and  should not be used as a sole basis for treatment. Nasal washings and  aspirates are unacceptable for Xpert Xpress SARS-CoV-2/FLU/RSV  testing. Fact Sheet for Patients: https://www.moore.com/https://www.fda.gov/media/142436/download Fact Sheet for Healthcare Providers: https://www.young.biz/https://www.fda.gov/media/142435/download This test is not yet approved or cleared by the [place] FDA and  has been authorized for detection and/or diagnosis of SARS-CoV-2 by  FDA under an Emergency Use Authorization (EUA). This EUA will remain  in effect (meaning this test can be used) for the duration of the  Covid-19 declaration under Section 564(b)(1) of the Act, 21  U.S.C. section 360bbb-3(b)(1), unless the authorization is  terminated or revoked. Performed at [facility], [address] [place], [place] [address]      Radiology Studies:  EEG  Result Date: 01/20/2020 Charlsie QuestYadav, Priyanka O, MD     01/21/2020  9:44 AM Patient Name: Crystal Espinoza MRN: [id number] Epilepsy Attending: Charlsie QuestPriyanka O Yadav Referring Physician/Provider: Dr Georgiana SpinnerSushanth Aroor Date: 01/20/2020 Duration: 23.02 mins Patient history: 86yo F presented with AMS. On exam noted to have aphasia and possible right-sided neglect. EEG to evaluate for seizure. Level of alertness: lethargic AEDs during EEG study:  Ativan Technical aspects: This EEG study was done with scalp electrodes positioned according to the 10-20 International system of electrode placement. Electrical activity was acquired at a sampling rate of 500Hz  and reviewed with a high frequency filter of 70Hz  and a low frequency filter of 1Hz . EEG data were recorded continuously and digitally stored. DESCRIPTION: EEG showed sharp waves in left frontal region, maximal F3/F7. Continuous generalized low  amplitude 2-3Hz  rhythmic delta slowing in left hemisphere was also noted. Additionally, 8-10Hz  polymorphic alpha activity was seen in right hemisphere. There is also an excessive amount of 15 to 18 Hz, 2-3 uV beta activity with irregular morphology distributed symmetrically and diffusely. Hyperventilation and photic stimulation were not performed. Abnormality -Sharp waves, left frontal region, maximal F3/F7 -Continuous slow, generalized and lateralized left hemisphere -Excessive beta, general IMPRESSION: This study showed evidence of epileptogenicity in left frontal region as well as cortical dysfunction in left hemisphere likely secondary to underlying structural abnormality, postictal state.  Additionally there is evidence of severe diffuse encephalopathy, nonspecific etiology. The excessive beta activity seen in the background is most likely due to the effect of benzodiazepine and is a  benign EEG pattern.No seizures were seen throughout the recording. Lora Havens   DG CHEST PORT 1 VIEW  Result Date: 01/20/2020 CLINICAL DATA:  Shortness of breath EXAM: PORTABLE CHEST 1 VIEW COMPARISON:  04/09/2017 FINDINGS: Cardiomegaly. Left pacer remains in place, unchanged. Left lower lobe retrocardiac opacity, question pneumonia. No confluent opacity on the right. No effusions or acute bony abnormality. IMPRESSION: Cardiomegaly. Left lower lobe/retrocardiac opacity concerning for pneumonia. Electronically Signed   By: Rolm Baptise M.D.   On: 01/20/2020 19:42   Overnight EEG with video  Result Date: 01/21/2020 Lora Havens, MD     01/21/2020  1:17 PM Patient Name: Crystal Espinoza MRN: [id number] Epilepsy Attending: Lora Havens Referring Physician/Provider: Dr Karena Addison Aroor Duration: 4/8/201 2203 to 01/21/2020 0947  Patient history: 86yo F presented with AMS. On exam noted to have aphasia and possible right-sided neglect. EEG to evaluate for seizure.  Level of alertness: lethargic  AEDs during EEG study:   Ativan  Technical aspects: This EEG study was done with scalp electrodes positioned according to the 10-20 International system of electrode placement. Electrical activity was acquired at a sampling rate of 500Hz  and reviewed with a high frequency filter of 70Hz  and a low frequency filter of 1Hz . EEG data were recorded continuously and digitally stored.  DESCRIPTION: EEG showed sharp waves in left frontal region, maximal F3/F7. Continuous generalized and lateralized left hemisphere 3-6hz  theta-delta slowing was noted. Hyperventilation and photic stimulation were not performed.  Abnormality - Sharp waves, left frontal region, maximal F3/F7 - Continuous slow, generalized and lateralized left hemisphere IMPRESSION: This study showed evidence of epileptogenicity in left frontal region as well as cortical dysfunction in left hemisphere likely secondary to underlying structural abnormality, postictal state. Additionally there is evidence of moderate diffuse encephalopathy, nonspecific etiology. No seizures were seen throughout the recording. EEG appears to be improving compared to previous study.   ECHOCARDIOGRAM COMPLETE  Result Date: 01/21/2020    ECHOCARDIOGRAM REPORT   Patient Name:   Crystal Espinoza Date of Exam: [birth year]/22[birth year] Medical Rec #:  [id number]         Height:       66.0 in Accession #:    [id number]        Weight:       120.6 lb Date of Birth:  10/31/1928          BSA:          1.613 m Patient Age:    32 years          BP:           142/65 mmHg Patient Gender: F                 HR:           61 bpm. Exam Location:  Inpatient Procedure: 2D Echo, Color Doppler and Cardiac Doppler Indications:    Stroke i163.9  History:        Patient has prior history of Echocardiogram examinations, most                 recent 07/17/2018. Pacemaker, Pulmonary HTN, Arrythmias:RBBB and                 Atrial Fibrillation; Risk Factors:Hypertension.  Sonographer:    Raquel Sarna Senior RDCS Referring Phys: [id number] [place]  1. Left ventricular ejection fraction, by estimation, is 45 to 50%. The left ventricle has mildly decreased function. The left ventricle has no regional  wall motion abnormalities. There is mild concentric left ventricular hypertrophy. Left ventricular diastolic function could not be evaluated.  2. Right ventricular systolic function is normal. The right ventricular size is normal. There is normal pulmonary artery systolic pressure. The estimated right ventricular systolic pressure is 28.6 mmHg.  3. Left atrial size was severely dilated.  4. Right atrial size was severely dilated.  5. The mitral valve is normal in structure. Trivial mitral valve regurgitation.  6. The aortic valve is tricuspid. Aortic valve regurgitation is not visualized. Mild aortic valve sclerosis is present, with no evidence of aortic valve stenosis.  7. The inferior vena cava is dilated in size with >50% respiratory variability, suggesting right atrial pressure of 8 mmHg. Comparison(s): Prior images reviewed side by side. The left ventricular function is worsened. This appears to be largely due to RV apical pacing-induced dyssynchrony (the previous study showed motly ventricular sensed rhythm). The estimated systolic PA pressure has improved. FINDINGS  Left Ventricle: Left ventricular ejection fraction, by estimation, is 45 to 50%. The left ventricle has mildly decreased function. The left ventricle has no regional wall motion abnormalities. The left ventricular internal cavity size was normal in size. There is mild concentric left ventricular hypertrophy. Abnormal (paradoxical) septal motion, consistent with RV pacemaker. Left ventricular diastolic function could not be evaluated due to atrial fibrillation. Left ventricular diastolic function could not be evaluated. Right Ventricle: The right ventricular size is normal. No increase in right ventricular wall thickness. Right ventricular systolic function is normal. There is normal  pulmonary artery systolic pressure. The tricuspid regurgitant velocity is 2.27 m/s, and  with an assumed right atrial pressure of 8 mmHg, the estimated right ventricular systolic pressure is 28.6 mmHg. Left Atrium: Left atrial size was severely dilated. Right Atrium: Right atrial size was severely dilated. Pericardium: There is no evidence of pericardial effusion. Mitral Valve: The mitral valve is normal in structure. Trivial mitral valve regurgitation. Tricuspid Valve: The tricuspid valve is normal in structure. Tricuspid valve regurgitation is trivial. Aortic Valve: The aortic valve is tricuspid. Aortic valve regurgitation is not visualized. Mild aortic valve sclerosis is present, with no evidence of aortic valve stenosis. Pulmonic Valve: The pulmonic valve was grossly normal. Pulmonic valve regurgitation is mild. Aorta: The aortic root and ascending aorta are structurally normal, with no evidence of dilitation. Venous: The inferior vena cava is dilated in size with greater than 50% respiratory variability, suggesting right atrial pressure of 8 mmHg. IAS/Shunts: No atrial level shunt detected by color flow Doppler.  LEFT VENTRICLE PLAX 2D LVIDd:         3.50 cm     Diastology LVIDs:         2.30 cm     LV e' lateral:   8.27 cm/s LV PW:         1.30 cm     LV E/e' lateral: 6.2 LV IVS:        1.20 cm     LV e' medial:    4.90 cm/s LVOT diam:     1.90 cm     LV E/e' medial:  10.5 LV SV:         35 LV SV Index:   22 LVOT Area:     2.84 cm  LV Volumes (MOD) LV vol d, MOD A2C: 48.9 ml LV vol d, MOD A4C: 63.7 ml LV vol s, MOD A2C: 35.3 ml LV vol s, MOD A4C: 36.5 ml LV SV MOD A2C:     13.6  ml LV SV MOD A4C:     63.7 ml LV SV MOD BP:      22.4 ml RIGHT VENTRICLE RV S prime:     9.36 cm/s TAPSE (M-mode): 1.1 cm LEFT ATRIUM             Index       RIGHT ATRIUM           Index LA diam:        3.90 cm 2.42 cm/m  RA Area:     24.00 cm LA Vol (A2C):   90.2 ml 55.92 ml/m RA Volume:   70.70 ml  43.83 ml/m LA Vol (A4C):   82.4  ml 51.09 ml/m LA Biplane Vol: 86.4 ml 53.57 ml/m  AORTIC VALVE LVOT Vmax:   61.40 cm/s LVOT Vmean:  42.000 cm/s LVOT VTI:    0.125 m  AORTA Ao Root diam: 3.00 cm Ao Asc diam:  3.00 cm MITRAL VALVE               TRICUSPID VALVE MV Area (PHT): 3.31 cm    TR Peak grad:   20.6 mmHg MV Decel Time: 229 msec    TR Vmax:        227.00 cm/s MV E velocity: 51.40 cm/s MV A velocity: 28.70 cm/s  SHUNTS MV E/A ratio:  1.79        Systemic VTI:  0.12 m                            Systemic Diam: 1.90 cm Rachelle Hora Croitoru MD Electronically signed by Thurmon Fair MD Signature Date/Time: 01/21/2020/4:13:30 PM    Final      Scheduled Meds:   . atorvastatin  40 mg Oral q1800  . enoxaparin (LOVENOX) injection  55 mg Subcutaneous Q12H  . levothyroxine  50 mcg Intravenous Daily    Continuous Infusions:   . sodium chloride 75 mL/hr at 01/21/20 0803  . levETIRAcetam 500 mg (01/21/20 0867)     LOS: 1 day     Marcellus Scott, MD, [place], [facility]. [facility]    To contact the attending provider between 7A-7P or the covering provider during after hours 7P-7A, please log into the web site [email or web address] and access using universal  password for that web site. If you do not have the password, please call the hospital operator.  01/21/2020, 5:10 PM

## 2020-01-21 NOTE — Procedures (Addendum)
Patient Name: ELLIANNA RUEST  MRN: [id number]  Epilepsy Attending: Charlsie Quest  Referring Physician/Provider: Dr Georgiana Spinner Aroor Duration: 4/8/201 2203 to 01/21/2020 0947  Patient history: 86yo F presented with AMS. On exam noted to have aphasia and possible right-sided neglect. EEG to evaluate for seizure.  Level of alertness: lethargic  AEDs during EEG study:  Ativan  Technical aspects: This EEG study was done with scalp electrodes positioned according to the 10-20 International system of electrode placement. Electrical activity was acquired at a sampling rate of 500Hz  and reviewed with a high frequency filter of 70Hz  and a low frequency filter of 1Hz . EEG data were recorded continuously and digitally stored.   DESCRIPTION: EEG showed sharp waves in left frontal region, maximal F3/F7. Continuous generalized and lateralized left hemisphere 3-6hz  theta-delta slowing was noted. Hyperventilation and photic stimulation were not performed.  Abnormality - Sharp waves, left frontal region, maximal F3/F7 - Continuous slow, generalized and lateralized left hemisphere  IMPRESSION: This study showed evidence of epileptogenicity in left frontal region as well as cortical dysfunction in left hemisphere likely secondary to underlying structural abnormality, postictal state. Additionally there is evidence of moderate diffuse encephalopathy, nonspecific etiology. No seizures were seen throughout the recording.  EEG appears to be improving compared to previous study.

## 2020-01-21 NOTE — Evaluation (Signed)
Occupational Therapy Evaluation Patient Details Name: Crystal Espinoza MRN: [id number] DOB: Jul 29, 1929 Today's Date: 01/21/2020    History of Present Illness 86 y.o. female with medical history significant of with history of paroxysmal A. fib/a flutter, symptomatic bradycardia s/p PPM, severe pulmonary hypertension, hypertension presented with altered mental status.    Husband and son reported that patient does not have baseline dementia, she cannot read newspapers but she cannot cook. Patient was last seen normal around midnight.  Husband reported patient woke up very agitated, smashing furnitures and utensils in kitchen, and husband noticed some right-sided facial droop which disappeared later.  Son called the house and noted at 2:30 PM she was showing expressive aphasia.   Clinical Impression   Patient presenting with decreased I in self care, balance, functional transfers/mobility, endurance, safety awareness, and strengthening.  Patient's husband reports I do " 50/50".  However, therapist having to assist caregiver out of chair in room during this session. Patient currently functioning at min - total A for self care tasks with functional mobility requiring min - mod +2 for safety. Patient will benefit from acute OT to increase overall independence in the areas of ADLs, functional mobility, and safety awareness in order to safely discharge to next venue of care.    Follow Up Recommendations  SNF    Equipment Recommendations  Other (comment)(defer to next venue of care)       Precautions / Restrictions Precautions Precautions: Fall      Mobility Bed Mobility Overal bed mobility: Needs Assistance Bed Mobility: Supine to Sit;Sit to Supine;Rolling Rolling: Min assist   Supine to sit: Mod assist Sit to supine: Mod assist   General bed mobility comments: min cuing for proper technique and hand placement  Transfers Overall transfer level: Needs assistance Equipment used: 2 person hand  held assist Transfers: Sit to/from Stand Sit to Stand: Min assist;+2 physical assistance         General transfer comment: min - mod +2 to take a few steps forwards and backwards    Balance Overall balance assessment: Needs assistance Sitting-balance support: Feet supported Sitting balance-Leahy Scale: Fair     Standing balance support: During functional activity;Bilateral upper extremity supported Standing balance-Leahy Scale: Poor              ADL either performed or assessed with clinical judgement   ADL Overall ADL's : Needs assistance/impaired     Grooming: Wash/dry hands;Wash/dry face;Oral care;Sitting;Set up   Upper Body Bathing: Minimal assistance;Sitting   Lower Body Bathing: Maximal assistance;Sit to/from stand   Upper Body Dressing : Minimal assistance;Sitting   Lower Body Dressing: Maximal assistance;Sit to/from stand         Vision   Vision Assessment?: Vision impaired- to be further tested in functional context Additional Comments: R inattention noted during session but pt will track with cuing            Pertinent Vitals/Pain Pain Assessment: Faces Faces Pain Scale: No hurt     Hand Dominance Right   Extremity/Trunk Assessment Upper Extremity Assessment Upper Extremity Assessment: Generalized weakness   Lower Extremity Assessment Lower Extremity Assessment: Defer to PT evaluation   Cervical / Trunk Assessment Cervical / Trunk Assessment: Kyphotic   Communication Communication Communication: No difficulties   Cognition Arousal/Alertness: Lethargic Behavior During Therapy: Flat affect Overall Cognitive Status: History of cognitive impairments - at baseline        General Comments: Pt follows one step commands with increased time. Initially, pt very sleepy but  active participant with session.              Home Living Family/patient expects to be discharged to:: Private residence Living Arrangements: Spouse/significant  other Available Help at Discharge: Family;Available 24 hours/day Type of Home: House       Home Layout: One level       Additional Comments: unsure of home set up- husband left abruptly at beginning of session      Prior Functioning/Environment Level of Independence: Needs assistance  Gait / Transfers Assistance Needed: husband reports "50/50" and " I just held her up" ADL's / [place] Needed: "I did 50 percent"            OT Problem List: Decreased strength;Decreased activity tolerance;Decreased safety awareness;Impaired balance (sitting and/or standing);Decreased knowledge of use of DME or AE      OT Treatment/Interventions: Self-care/ADL training;Therapeutic exercise;Therapeutic activities;Energy conservation;Neuromuscular education;Balance training;Modalities;Manual therapy;DME and/or AE instruction;Patient/family education;Visual/perceptual remediation/compensation;Cognitive remediation/compensation    OT Goals(Current goals can be found in the care plan section) Acute Rehab OT Goals Patient Stated Goal: none stated Potential to Achieve Goals: Fair ADL Goals Pt Will Perform Grooming: with supervision;standing Pt Will Perform Upper Body Bathing: with supervision;sitting Pt Will Perform Lower Body Bathing: with min guard assist;sit to/from stand Pt Will Perform Upper Body Dressing: with supervision Pt Will Perform Lower Body Dressing: with min guard assist;sit to/from stand Pt Will Transfer to Toilet: with min guard assist Pt Will Perform Toileting - Clothing Manipulation and hygiene: with min guard assist  OT Frequency: Min 2X/week   Barriers to D/C: Decreased caregiver support  husband appeared to not be in the best of health himself. He needed assistance to stand from recliner chair during evaluation and needed transport to the room. He reports being the primary caregiver and could be very unsafe at home.       Co-evaluation PT/OT/SLP  Co-Evaluation/Treatment: Yes Reason for Co-Treatment: For patient/therapist safety;To address functional/ADL transfers   OT goals addressed during session: ADL's and self-care      AM-PAC OT "6 Clicks" Daily Activity     Outcome Measure Help from another person eating meals?: A Little Help from another person taking care of personal grooming?: A Lot Help from another person toileting, which includes using toliet, bedpan, or urinal?: Total Help from another person bathing (including washing, rinsing, drying)?: A Lot Help from another person to put on and taking off regular upper body clothing?: A Little Help from another person to put on and taking off regular lower body clothing?: Total 6 Click Score: 12   End of Session Equipment Utilized During Treatment: Gait belt Nurse Communication: Mobility status;Precautions  Activity Tolerance: Patient limited by fatigue Patient left: in bed;with call bell/phone within reach;with bed alarm set  OT Visit Diagnosis: Unsteadiness on feet (R26.81);Muscle weakness (generalized) (M62.81)                Time: 7341-9379 OT Time Calculation (min): 20 min Charges:  OT General Charges $OT Visit: 1 Visit OT Evaluation $OT Eval Moderate Complexity: 1 Mod  Florence Yeung P MS, OTR/L 01/21/2020, 2:18 PM

## 2020-01-21 NOTE — Progress Notes (Signed)
No response back from [place]. Wilford Corner inquired about keppra scheduled doses being correct. Dr. Wilford Corner corrected dose. Appreciated the collaboration.

## 2020-01-21 NOTE — Procedures (Addendum)
Patient Name: Crystal Espinoza  MRN: [id number]  Epilepsy Attending: Charlsie Quest  Referring Physician/Provider: Dr Georgiana Spinner [place] Date: 01/20/2020 Duration: 23.02 mins  Patient history: 86yo F presented with AMS. On exam noted to have aphasia and possible right-sided neglect. EEG to evaluate for seizure.  Level of alertness: lethargic  AEDs during EEG study:  Ativan  Technical aspects: This EEG study was done with scalp electrodes positioned according to the 10-20 International system of electrode placement. Electrical activity was acquired at a sampling rate of 500Hz  and reviewed with a high frequency filter of 70Hz  and a low frequency filter of 1Hz . EEG data were recorded continuously and digitally stored.   DESCRIPTION: EEG showed sharp waves in left frontal region, maximal F3/F7. Continuous generalized low amplitude 2-3Hz  rhythmic delta slowing in left hemisphere was also noted. Additionally, 8-10Hz  polymorphic alpha activity was seen in right hemisphere. There is also an excessive amount of 15 to 18 Hz, 2-3 uV beta activity with irregular morphology distributed symmetrically and diffusely. Hyperventilation and photic stimulation were not performed.  Abnormality -Sharp waves, left frontal region, maximal F3/F7 -Continuous slow, generalized and lateralized left hemisphere -Excessive beta, general  IMPRESSION: This study showed evidence of epileptogenicity in left frontal region as well as cortical dysfunction in left hemisphere likely secondary to underlying structural abnormality, postictal state.  Additionally there is evidence of severe diffuse encephalopathy, nonspecific etiology. The excessive beta activity seen in the background is most likely due to the effect of benzodiazepine and is a benign EEG pattern.No seizures were seen throughout the recording.     Zarya Lasseigne 

## 2020-01-21 NOTE — Evaluation (Signed)
Physical Therapy Evaluation Patient Details Name: Crystal Espinoza MRN: [id number] DOB: 09/24/1929 Today's Date: 01/21/2020   History of Present Illness  86 y.o. female with medical history significant of with history of paroxysmal A. fib/a flutter, symptomatic bradycardia s/p PPM, severe pulmonary hypertension, hypertension presented with altered mental status.    Husband and son reported that patient does not have baseline dementia, she cannot read newspapers but she cannot cook. Patient was last seen normal around midnight.  Husband reported patient woke up very agitated, smashing furnitures and utensils in kitchen, and husband noticed some right-sided facial droop which disappeared later.  Son called the house and noted at 2:30 PM she was showing expressive aphasia. Pt with diagnosis of seizures, acute post ictal encephalopathy.  Clinical Impression   Pt presents with generalized weakness, impaired sitting and standing balance, difficulty performing mobility tasks, and decreased activity tolerance. Pt to benefit from acute PT to address deficits. Pt ambulated very short room distance this session, and requires min-mod +2 to safely perform all mobility. PT and OT briefly met pt's husband, and he needed physical assist from OT to mobilize out of his chair. At this time given difficulty of husband to provide physical support and pt's deficits, PT recommending SNF level of care post-acutely. PT to progress mobility as tolerated, and will continue to follow acutely.      Follow Up Recommendations SNF;Supervision/Assistance - 24 hour    Equipment Recommendations  None recommended by PT    Recommendations for Other Services       Precautions / Restrictions Precautions Precautions: Fall Precaution Comments: seizure Restrictions Weight Bearing Restrictions: No      Mobility  Bed Mobility Overal bed mobility: Needs Assistance Bed Mobility: Supine to Sit;Sit to Supine;Rolling Rolling: Min  assist   Supine to sit: Mod assist Sit to supine: Mod assist   General bed mobility comments: min assist for rolling to R for trunk translation, LE management for adjusting bed pad. mod assist for supine<>sit for trunk and LE maangement, scooting up in bed with use of bed pads.  Transfers Overall transfer level: Needs assistance Equipment used: 2 person hand held assist Transfers: Sit to/from Stand Sit to Stand: Min assist;+2 physical assistance         General transfer comment: Min +2 for power up, steadying. Pt able to march in place without LOB.  Ambulation/Gait Ambulation/Gait assistance: Min assist;+2 physical assistance;+2 safety/equipment Gait Distance (Feet): 3 Feet Assistive device: 2 person hand held assist Gait Pattern/deviations: Step-through pattern;Decreased stride length;Shuffle;Narrow base of support;Trunk flexed Gait velocity: decr   General Gait Details: Min assist +2 to steady, guide pt trajectory. Pt took ~3 steps forward and fatigued, returned to bed via small backwards steps.  Stairs            Wheelchair Mobility    Modified Rankin (Stroke Patients Only)       Balance Overall balance assessment: Needs assistance Sitting-balance support: Feet supported Sitting balance-Leahy Scale: Fair Sitting balance - Comments: sat EOB without PT support   Standing balance support: During functional activity;Bilateral upper extremity supported Standing balance-Leahy Scale: Poor Standing balance comment: reliant on external support                             Pertinent Vitals/Pain Pain Assessment: Faces Faces Pain Scale: No hurt Pain Intervention(s): Monitored during session    Home Living Family/patient expects to be discharged to:: Private residence Living Arrangements: Spouse/significant other  Available Help at Discharge: Family;Available 24 hours/day Type of Home: House       Home Layout: One level   Additional Comments: unsure  of home set up- husband left abruptly at beginning of session    Prior Function Level of Independence: Needs assistance   Gait / Transfers Assistance Needed: husband reports "50/50" and " I just held her up"  ADL's / Homemaking Assistance Needed: "I did 48 percent" per husband        Hand Dominance   Dominant Hand: Right    Extremity/Trunk Assessment   Upper Extremity Assessment Upper Extremity Assessment: Defer to OT evaluation    Lower Extremity Assessment Lower Extremity Assessment: Generalized weakness    Cervical / Trunk Assessment Cervical / Trunk Assessment: Kyphotic  Communication   Communication: No difficulties  Cognition Arousal/Alertness: Lethargic Behavior During Therapy: Flat affect Overall Cognitive Status: History of cognitive impairments - at baseline                                 General Comments: Pt follows one step commands with increased time. Initially, pt very sleepy but active participant with session.      General Comments      Exercises     Assessment/Plan    PT Assessment Patient needs continued PT services  PT Problem List Decreased strength;Decreased mobility;Decreased safety awareness;Decreased activity tolerance;Decreased balance;Decreased knowledge of use of DME;Decreased cognition       PT Treatment Interventions DME instruction;Therapeutic activities;Gait training;Therapeutic exercise;Patient/family education;Balance training;Functional mobility training;Neuromuscular re-education    PT Goals (Current goals can be found in the Care Plan section)  Acute Rehab PT Goals Patient Stated Goal: none stated PT Goal Formulation: With patient Time For Goal Achievement: 02/04/20 Potential to Achieve Goals: Good    Frequency Min 3X/week   Barriers to discharge Decreased caregiver support      Co-evaluation PT/OT/SLP Co-Evaluation/Treatment: Yes Reason for Co-Treatment: For patient/therapist safety;To address  functional/ADL transfers;Necessary to address cognition/behavior during functional activity PT goals addressed during session: Mobility/safety with mobility OT goals addressed during session: ADL's and self-care       AM-PAC PT "6 Clicks" Mobility  Outcome Measure Help needed turning from your back to your side while in a flat bed without using bedrails?: A Lot Help needed moving from lying on your back to sitting on the side of a flat bed without using bedrails?: A Lot Help needed moving to and from a bed to a chair (including a wheelchair)?: A Lot Help needed standing up from a chair using your arms (e.g., wheelchair or bedside chair)?: A Little Help needed to walk in hospital room?: A Lot Help needed climbing 3-5 steps with a railing? : A Lot 6 Click Score: 13    End of Session Equipment Utilized During Treatment: Gait belt Activity Tolerance: Patient limited by fatigue Patient left: in bed;with call bell/phone within reach;with bed alarm set;with nursing/sitter in room;with SCD's reapplied Nurse Communication: Mobility status PT Visit Diagnosis: Other abnormalities of gait and mobility (R26.89);Muscle weakness (generalized) (M62.81)    Time: 7026-3785 PT Time Calculation (min) (ACUTE ONLY): 20 min   Charges:   PT Evaluation $PT Eval Low Complexity: 1 Low         Jerome Otter E, PT Acute Rehabilitation Services Pager [phone]  Office [phone]   Ellieanna Funderburg D Elonda Husky 01/21/2020, 4:24 PM

## 2020-01-21 NOTE — Progress Notes (Addendum)
Subjective: No acute events overnight.  ROS: negative except above  Examination  Vital signs in last 24 hours: Temp:  [97.8 F (36.6 C)-99.5 F (37.5 C)] 98.5 F (36.9 C) (04/09 0751) Pulse Rate:  [59-77] 59 (04/09 0751) Resp:  [16-33] 23 (04/09 0752) BP: (142-182)/(65-106) 142/65 (04/09 0751) SpO2:  [95 %-100 %] 100 % (04/09 0752) Weight:  [54.7 kg] 54.7 kg (04/08 1640)  General: lying in bed, not in apparent distress CVS: pulse-normal rate and rhythm RS: breathing comfortably, CTA B Extremities: normal, warm  Neuro: MS: Alert, oriented to self and place, follows commands CN: pupils equal and reactive,  EOMI, no apparent facial asymmetry  Motor: Antigravity strength in all 4 extremities  Basic Metabolic Panel: Recent Labs  Lab 01/20/20 1558 01/20/20 1607 01/20/20 1937  NA 138 136 135  K 4.3 4.0 4.3  CL 104 103  --   CO2 20*  --   --   GLUCOSE 111* 109*  --   BUN 11 12  --   CREATININE 0.80 0.80  --   CALCIUM 9.1  --   --     CBC: Recent Labs  Lab 01/20/20 1558 01/20/20 1607 01/20/20 1937 01/21/20 0341  WBC 4.2  --   --  6.0  NEUTROABS 3.1  --   --  4.3  HGB 14.5 15.0 16.0* 13.9  HCT 46.4* 44.0 47.0* 42.8  MCV 107.9*  --   --  106.2*  PLT 123*  --   --  129*     Coagulation Studies: Recent Labs    01/20/20 1558  LABPROT 14.5  INR 1.1    Imaging  CT head without contrast 01/20/2020: No acute abnormality CTA head and neck 01/20/2020: 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 This is age-indeterminate but probably chronic. Otherwise, no large vessel occlusion or significant stenosis. Caliber of more distal right MCA branches is reduced relative to the left. CT perfusion 01/20/2020: Nondiagnostic perfusion imaging.   ASSESSMENT AND PLAN: 86 year old female with history of atrial fibrillation on Xarelto, hypertension who presented with altered mental status.  On exam  patient was noted to be aphasic and possibly right-sided neglect.  CT head did not show any acute abnormalities.  Stat EEG was performed which showed sharp waves in left frontal region as well as slowing in the left hemisphere.  New onset seizure, unprovoked Acute post ictal encephalopathy Hyperlipidemia Hyperbilirubinemia Atrial fibrillation on anticoagulation -EEG showed sharp waves in the frontal region as well as slowing in left hemisphere.  Along with patient's presentation of aphasia and possible right-sided neglect with normal CT head this is most likely suggestive of seizure.  -No leukocytosis, no evidence of UTI, no evidence of any other seizure provoking factors.  Was hypertensive with blood pressure 180/83 on arrival.  However no evidence of PRES on CT head. -Call [employer] to check if pacemaker is MRI compatible.  Was told that it is MRI incompatible due to ventricular leads. -As patient is already improving, low suspicion for meningitis/encephalitis  Recommendations -As patient had unprovoked seizure and abnormal EEG, the risk of recurrence is higher and therefore I would recommend continuing Keppra 500 mg twice daily.  -If patient is very sedated on this dose, can consider reducing to 250 mg in the morning and 500 mg at bedtime. -Already on anticoagulation for atrial fibrillation, started atorvastatin 40mg  daily for hyperlipidemia. -Ideally would have liked MRI brain for further evaluation but as  pacemaker is MRI incompatible, if patient has any further seizures, more work-up can be considered as an outpatient -Continue seizure precautions, goal systolic blood pressure normotension -As needed IV Ativan 2 mg for generalized tonic-clonic seizure lasting 1 to 2 minutes of focal seizure lasting more than 5 minutes -Follow-up with neurology in 8 to 12 weeks after discharge.  -I attempted to call patient's husband at around 1440.  However, patient had trouble understanding me on phone.   Will try to discuss plan if husband comes to the hospital in future.  Thank you for allowing [place] to participate in the care of this patient.  Neurology will follow peripherally.  Please page neuro hospitalist for any further questions.  I have spent a total of  35 minutes with the patient reviewing hospital notes,  test results, labs and examining the patient as well as establishing an assessment and plan that was discussed personally with the patient's team.  > 50% of time was spent in direct patient care.   Prestin Munch Annabelle Harman

## 2020-01-22 ENCOUNTER — Inpatient Hospital Stay (HOSPITAL_COMMUNITY): Payer: Medicare Other

## 2020-01-22 LAB — CBC
HCT: 41.7 % (ref 36.0–46.0)
Hemoglobin: 13.4 g/dL (ref 12.0–15.0)
MCH: 34.3 pg — ABNORMAL HIGH (ref 26.0–34.0)
MCHC: 32.1 g/dL (ref 30.0–36.0)
MCV: 106.6 fL — ABNORMAL HIGH (ref 80.0–100.0)
Platelets: 111 10*3/uL — ABNORMAL LOW (ref 150–400)
RBC: 3.91 MIL/uL (ref 3.87–5.11)
RDW: 15.6 % — ABNORMAL HIGH (ref 11.5–15.5)
WBC: 6.8 10*3/uL (ref 4.0–10.5)
nRBC: 0 % (ref 0.0–0.2)

## 2020-01-22 LAB — BASIC METABOLIC PANEL
Anion gap: 15 (ref 5–15)
BUN: 9 mg/dL (ref 8–23)
CO2: 20 mmol/L — ABNORMAL LOW (ref 22–32)
Calcium: 8.7 mg/dL — ABNORMAL LOW (ref 8.9–10.3)
Chloride: 104 mmol/L (ref 98–111)
Creatinine, Ser: 0.79 mg/dL (ref 0.44–1.00)
GFR calc Af Amer: >60 mL/min (ref >60)
GFR calc non Af Amer: >60 mL/min (ref >60)
Glucose, Bld: 77 mg/dL (ref 70–99)
Potassium: 3.9 mmol/L (ref 3.5–5.1)
Sodium: 139 mmol/L (ref 135–145)

## 2020-01-22 LAB — HEMOGLOBIN A1C
Hgb A1c MFr Bld: 5.6 % (ref 4.8–5.6)
Mean Plasma Glucose: 114 mg/dL

## 2020-01-22 MED ORDER — SODIUM CHLORIDE 0.9 % IV SOLN
250.0000 mg | Freq: Two times a day (BID) | INTRAVENOUS | Status: DC
  Administered 2020-01-22 – 2020-01-24 (×4): 250 mg via INTRAVENOUS
  Filled 2020-01-22 (×6): qty 2.5

## 2020-01-22 MED ORDER — SODIUM CHLORIDE 0.9 % IV SOLN: INTRAVENOUS | Status: DC

## 2020-01-22 MED ORDER — RIVAROXABAN 15 MG PO TABS
15.0000 mg | ORAL_TABLET | Freq: Every day | ORAL | Status: DC
  Administered 2020-01-22 – 2020-01-24 (×3): 15 mg via ORAL
  Filled 2020-01-22 (×3): qty 1

## 2020-01-22 NOTE — Evaluation (Signed)
Clinical/Bedside Swallow Evaluation Patient Details  Name: Crystal Espinoza MRN: [id number] Date of Birth: July 06, 1929  Today's Date: 01/22/2020 Time: SLP Start Time (ACUTE ONLY): 0806 SLP Stop Time (ACUTE ONLY): 0819 SLP Time Calculation (min) (ACUTE ONLY): 13 min  Past Medical History:  Past Medical History:  Diagnosis Date  . Cataract   . CREST syndrome (HCC)   . Hypertension   . Nausea   . Paroxysmal atrial fibrillation (HCC) 02/02/2017  . Severe pulmonary hypertension (HCC)    on cath 02/2017  . Symptomatic bradycardia 04/08/2017   Past Surgical History:  Past Surgical History:  Procedure Laterality Date  . ABDOMINAL HYSTERECTOMY    . EYE SURGERY    . PACEMAKER IMPLANT N/A 04/08/2017   Procedure: Pacemaker Implant;  Surgeon: Marinus Maw, MD;  Location: [facility] INVASIVE CV LAB;  Service: Cardiovascular;  Laterality: N/A;   HPI:  86 year old female with PMH of paroxysmal A. fib/atrial flutter on Xarelto, symptomatic bradycardia s/p PPM, severe pulmonary hypertension, hypertension, presented to the ED due to confusion, expressive aphasia and possible right-sided neglect.  Admitted for suspected acute stroke versus seizure.  Head CT was clear.  MRI has not been perfomed as of 4/10.     Assessment / Plan / Recommendation Clinical Impression  Pt was seen for a bedside swallow evaluation and she presents with suspected oropharyngeal dysphagia with a cognitive component.  Pt was lethargic throughout this evaluation, but she was agreeable to oral care and PO trials.  She was unable to fully complete an oral mechanism exam secondary to her difficulty with following commands.  Oral cavity was observed to be dry and the pt had missing dentition.  She consumed trials of ice chips, thin liquid (tsp, cup, straw), and puree.  She required assistance for self-feeding.  Labial closure around the spoon and straw was mildly reduced and the pt had anterior labial spillage on both sides depending on where  she positioned the straw.  She was able to draw liquid from the straw independently, but only in very small spurts.  AP transport appeared delayed with the ice chip and puree trial, and suspect delayed swallow initiation with liquid trials.  Intermittent audible swallows were observed, but no overt s/sx of aspiration were observed with any trials.  Min oral residue was observed following puree trials.  Regular solid trial was not attempted on this date secondary to pt's lethargy.  Of note, abdominal sounds were noted following all swallow initiations.  Pt was unable to answer when SLP questioned her about a hx of GERD or esophageal dysfunction.  Recommend initiation of Dysphagia 1 (puree) solids and thin liquids with medications administered crushed in puree.  Pt will require 1:1 assistance to assist with self-feeding and to cue for the following compensatory strategies: 1) Small bites/sips 2) Slow rate of intake 3) Only feed when awake/alert 4) Sit upright as possible. SLP will f/u for diet tolerance and diagnostic treatment.    SLP Visit Diagnosis: Dysphagia, unspecified (R13.10)    Aspiration Risk  Mild aspiration risk    Diet Recommendation Dysphagia 1 (Puree);Thin liquid   Liquid Administration via: Cup;Straw;Spoon Medication Administration: Crushed with puree Supervision: Staff to assist with self feeding;Full supervision/cueing for compensatory strategies Compensations: Minimize environmental distractions;Slow rate;Small sips/bites Postural Changes: Seated upright at 90 degrees    Other  Recommendations Oral Care Recommendations: Oral care QID;Staff/trained caregiver to provide oral care   Follow up Recommendations 24 hour supervision/assistance;Skilled Nursing facility      Frequency and Duration  min 2x/week  2 weeks       Prognosis Prognosis for Safe Diet Advancement: Fair Barriers to Reach Goals: Cognitive deficits      Swallow Study   General Date of Onset: 01/22/20 HPI:  86 year old female with PMH of paroxysmal A. fib/atrial flutter on Xarelto, symptomatic bradycardia s/p PPM, severe pulmonary hypertension, hypertension, presented to the ED due to confusion, expressive aphasia and possible right-sided neglect.  Admitted for suspected acute stroke versus seizure.  Head CT was clear.  MRI has not been perfomed as of 4/10.   Type of Study: Bedside Swallow Evaluation Previous Swallow Assessment: None Diet Prior to this Study: NPO Temperature Spikes Noted: No Respiratory Status: Nasal cannula History of Recent Intubation: No Behavior/Cognition: Confused;Lethargic/Drowsy Oral Cavity Assessment: Dry Oral Care Completed by SLP: Yes Oral Cavity - Dentition: Missing dentition;Poor condition Self-Feeding Abilities: Needs assist Patient Positioning: Upright in bed Baseline Vocal Quality: Low vocal intensity Volitional Cough: Cognitively unable to elicit Volitional Swallow: Unable to elicit    Oral/Motor/Sensory Function Overall Oral Motor/Sensory Function: Other (comment)(Pt unable to follow all commands for OME )   Ice Chips Ice chips: Impaired Presentation: Spoon Oral Phase Functional Implications: Prolonged oral transit Pharyngeal Phase Impairments: Suspected delayed Swallow   Thin Liquid Thin Liquid: Impaired Presentation: Straw;Cup;Spoon Oral Phase Functional Implications: Left anterior spillage;Right anterior spillage Pharyngeal  Phase Impairments: Suspected delayed Swallow    Nectar Thick Nectar Thick Liquid: Not tested   Honey Thick Honey Thick Liquid: Not tested   Puree Puree: Impaired Presentation: Spoon Oral Phase Functional Implications: Prolonged oral transit   Solid     Solid: Not tested     Crystal Espinoza M.S., CCC-SLP Acute Rehabilitation Services Office: [phone]  Crystal Espinoza Crystal Espinoza 01/22/2020,8:51 AM

## 2020-01-22 NOTE — Progress Notes (Signed)
PROGRESS NOTE   Crystal Espinoza  OZH:[id number]    DOB: 1929-08-24    DOA: 01/20/2020  PCP: Merri Brunette, MD   I have briefly reviewed patients previous medical records in [facility].  Chief Complaint:   Chief Complaint  Patient presents with  . Code Stroke    Brief Narrative:  86 year old female with PMH of paroxysmal A. fib/atrial flutter on Xarelto, symptomatic bradycardia s/p PPM, severe pulmonary hypertension, hypertension, presented to the ED due to confusion, expressive aphasia and possible right-sided neglect.  Evaluated by neurology in ED.  Admitted for suspected acute stroke versus seizure.   Assessment & Plan:  Active Problems:   CVA (cerebral vascular accident) (HCC)   AMS (altered mental status)   Seizure (HCC)   New onset seizure, unprovoked: CT head code stroke: No acute abnormality.  Atrophy and extensive chronic microvascular ischemia.  CTA head and neck and CTP: 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,  Probably chronic.  Otherwise no large vessel occlusion or significant stenosis.  Nondiagnostic perfusion imaging.  Unable to perform MRI due to PPM incompatibility.  Stat EEG showed sharp waves in left frontal region as well as slowing in the left hemisphere.  As per neurology, presentation of aphasia, possible right-sided neglect with normal CT head most likely suggestive of seizures.  No features suggestive of infectious causes as precipitant.  Hypertensive with BP 180/83 on arrival but no evidence of PRES on CT head.  Low index of suspicion for meningitis/encephalitis.  Patient was loaded with Keppra, continue Keppra 500 mg twice daily.  I discussed in detail with Dr. Melynda Ripple, Neurology on 4/9 who feels that this was likely not a stroke.  Unable to do MRI. Per Dr. Melynda Ripple, even if stroke confirmed, would not change management.  She is on Xarelto for anticoagulation, atorvastatin 40 mg  daily started for hyperlipidemia.  Seizure precautions.  Control blood pressure.  Outpatient follow-up with neurology in 8 to 12 weeks after discharge.    Patient lethargic today.  Discussed with Dr. Laurence Slate, input appreciated, reducing Keppra to 250 twice daily which may be contributing to lethargy and checking repeat head CT to further evaluate stroke since unable to get MRI brain.  As per ST, now on dysphagia 1 diet and thin liquids.  Acute encephalopathy: Secondary to postictal phase.  Mental status better than yesterday but still lethargic.  Reducing Keppra as above.  Monitor.  Atrial flutter: Xarelto had been changed to Lovenox on admission while she was n.p.o.  As communicated with pharmacy, transitioning to Xarelto tonight.  Admitting TRH MD curbside consulted cardiology who read patient's EKG and compared to her past EKG, and [employer] was contacted to investigate pacemaker, conclusion is atrial flutter is chronic and pacemaker working well (setting as 60 bpm to cocaine).  Essential hypertension: Reasonable inpatient control.  Intermittent soft blood pressures, continue to hold while still evaluating for stroke  Hypothyroidism: Continue IV Synthroid and change to oral when taking consistently.  Cyanosis: Present on admission.  Looks more central with right to left shunt.  Probably cor pulmonale.  pH 7.436, PCO2 29.7, PO2 92, bicarbonate 21 and oxygen saturation 98%.:  LVEF 45-50% no regional wall motion abnormalities.  Mild LVH.  Mostly in fingers and toes.  Unsure regarding chronicity.  Spouse left right after I arrived in the room stating that his ride was here and was not able to get much history.  Body mass index  is 19.46 kg/m.   Thrombocytopenia: Stable.  Follow CBC.  LLL opacity on chest x-ray: No clinical pneumonia.  Afebrile, no leukocytosis.  Monitor off of antibiotics.  Follow chest x-ray in 4 weeks.   DVT prophylaxis: Lovenox Code Status: Full Family Communication: Spouse was  briefly at bedside but despite introducing myself as the physician and wanting to speak with him, stated that his ride was here and he had to leave, "I know you will take good care of her" and left.  Was unable to speak to him. Disposition:  . Patient came from: Home           . Anticipated d/c place: SNF . Barriers to d/c: Pending further improvement in mental status changes, passing swallow eval and no further seizures.  SNF bed and insurance   Consultants:   Neurology  Procedures:   None  Antimicrobials:   None   Subjective:  More alert today but still lethargic.  Oriented to self.  Follows some simple instructions.  RN at bedside.  Objective:   Vitals:   01/22/20 0348 01/22/20 0748 01/22/20 1211 01/22/20 1553  BP: (!) 150/69 (!) 142/63 (!) 109/57 (!) 119/59  Pulse: (!) 59 63 60 63  Resp: 16 16 16 16   Temp: (!) 97.5 F (36.4 C) 98.4 F (36.9 C) 97.6 F (36.4 C) (!) 97.1 F (36.2 C)  TempSrc: Oral Axillary Axillary Axillary  SpO2: 94% 100% 100% 100%  Weight:      Height:        General exam: Elderly female, moderately built and frail, lying comfortably propped up in bed.  Oral mucosa borderline hydration Respiratory system: Clear to auscultation.  No increased work of breathing. Cardiovascular system: S1 & S2 heard, RRR. No JVD, murmurs, rubs, gallops or clicks. No pedal edema.  Telemetry personally reviewed: Atrial flutter with controlled ventricular rate/V paced. Gastrointestinal system: Abdomen is nondistended, soft and nontender. No organomegaly or masses felt. Normal bowel sounds heard. Central nervous system: Mental status as noted above. No focal neurological deficits. Extremities: Symmetric 5 x 5 power.  Fingers cool with mild tip of finger cyanosis.  Symmetric normal radial pulses.  No significant cyanosis of toes.  Bilateral dorsalis pedis felt. Skin: No rashes, lesions or ulcers Psychiatry: Judgement and insight impaired. Mood & affect cannot be assessed.      Data Reviewed:   I have personally reviewed following labs and imaging studies   CBC: Recent Labs  Lab 01/20/20 1558 01/20/20 1607 01/20/20 1937 01/21/20 0341 01/22/20 0531  WBC 4.2  --   --  6.0 6.8  NEUTROABS 3.1  --   --  4.3  --   HGB 14.5   < > 16.0* 13.9 13.4  HCT 46.4*   < > 47.0* 42.8 41.7  MCV 107.9*  --   --  106.2* 106.6*  PLT 123*  --   --  129* 111*   < > = values in this interval not displayed.    Basic Metabolic Panel: Recent Labs  Lab 01/20/20 1558 01/20/20 1558 01/20/20 1607 01/20/20 1937 01/22/20 0531  NA 138   < > 136 135 139  K 4.3   < > 4.0 4.3 3.9  CL 104  --  103  --  104  CO2 20*  --   --   --  20*  GLUCOSE 111*  --  109*  --  77  BUN 11  --  12  --  9  CREATININE 0.80  --  0.80  --  0.79  CALCIUM 9.1  --   --   --  8.7*   < > = values in this interval not displayed.    Liver Function Tests: Recent Labs  Lab 01/20/20 1558  AST 22  ALT 13  ALKPHOS 104  BILITOT 1.3*  PROT 6.5  ALBUMIN 3.8    CBG: Recent Labs  Lab 01/20/20 1602  GLUCAP 105*    Microbiology Studies:   Recent Results (from the past 240 hour(s))  Respiratory Panel by RT PCR (Flu A&B, Covid) - Nasopharyngeal Swab     Status: None   Collection Time: 01/20/20  4:35 PM   Specimen: Nasopharyngeal Swab  Result Value Ref Range Status   SARS Coronavirus 2 by RT PCR NEGATIVE NEGATIVE Final    Comment: (NOTE) SARS-CoV-2 target nucleic acids are NOT DETECTED. The SARS-CoV-2 RNA is generally detectable in upper respiratoy specimens during the acute phase of infection. The lowest concentration of SARS-CoV-2 viral copies this assay can detect is 131 copies/mL. A negative result does not preclude SARS-Cov-2 infection and should not be used as the sole basis for treatment or other patient management decisions. A negative result may occur with  improper specimen collection/handling, submission of specimen other than nasopharyngeal swab, presence of viral mutation(s)  within the areas targeted by this assay, and inadequate number of viral copies (<131 copies/mL). A negative result must be combined with clinical observations, patient history, and epidemiological information. The expected result is Negative. Fact Sheet for Patients:  PinkCheek.be Fact Sheet for Healthcare Providers:  GravelBags.it This test is not yet ap proved or cleared by the [place] FDA and  has been authorized for detection and/or diagnosis of SARS-CoV-2 by FDA under an Emergency Use Authorization (EUA). This EUA will remain  in effect (meaning this test can be used) for the duration of the COVID-19 declaration under Section 564(b)(1) of the Act, 21 U.S.C. section 360bbb-3(b)(1), unless the authorization is terminated or revoked sooner.    Influenza A by PCR NEGATIVE NEGATIVE Final   Influenza B by PCR NEGATIVE NEGATIVE Final    Comment: (NOTE) The Xpert Xpress SARS-CoV-2/FLU/RSV assay is intended as an aid in  the diagnosis of influenza from Nasopharyngeal swab specimens and  should not be used as a sole basis for treatment. Nasal washings and  aspirates are unacceptable for Xpert Xpress SARS-CoV-2/FLU/RSV  testing. Fact Sheet for Patients: PinkCheek.be Fact Sheet for Healthcare Providers: GravelBags.it This test is not yet approved or cleared by the [place] FDA and  has been authorized for detection and/or diagnosis of SARS-CoV-2 by  FDA under an Emergency Use Authorization (EUA). This EUA will remain  in effect (meaning this test can be used) for the duration of the  Covid-19 declaration under Section 564(b)(1) of the Act, 21  U.S.C. section 360bbb-3(b)(1), unless the authorization is  terminated or revoked. Performed at [facility] [place] [address], [place], [place] [address]      Radiology Studies:  No results found.   Scheduled  Meds:   . atorvastatin  40 mg Oral q1800  . levothyroxine  50 mcg Intravenous Daily  . rivaroxaban  15 mg Oral Q supper    Continuous Infusions:   . levETIRAcetam       LOS: 2 days     Vernell Leep, MD, [place], [facility]. [facility]    To contact the attending provider between 7A-7P or the covering provider during after hours 7P-7A, please log into the web  site [email or web address] and access using universal [place] password for that web site. If you do not have the password, please call the hospital operator.  01/22/2020, 5:50 PM

## 2020-01-22 NOTE — Social Work (Signed)
CSW attempted to initiate assessment. Patient currently disoriented x4. There was no family bedside. CSW made telephone call to patient's spouse Molly Maduro. CSW was unable to reach spouse and there was no voicemail available to leave a message. CSW will continue to follow and assist with discharge planning needs.  Verlon Au, MSW, [employer]

## 2020-01-22 NOTE — Progress Notes (Addendum)
NEURO HOSPITALIST PROGRESS NOTE   Subjective: Patient is lethargic, hard to stimulate today. On [place] 02.  Exam: Vitals:   01/22/20 0348 01/22/20 0748  BP: (!) 150/69 (!) 142/63  Pulse: (!) 59 63  Resp: 16 16  Temp: (!) 97.5 F (36.4 C) 98.4 F (36.9 C)  SpO2: 94% 100%    Physical Exam  Constitutional: Appears well-developed and well-nourished.  Psych: Affect appropriate to situation Eyes: Normal external eye and conjunctiva. HENT: Normocephalic, no lesions, without obvious abnormality. Mucous membranes dry  Musculoskeletal-no joint tenderness, deformity or swelling Cardiovascular: Normal rate and regular rhythm.  Respiratory: Effort normal, non-labored breathing saturations WNL on [place] [facility]: Soft.  No distension. There is no tenderness.  Skin: WDI   Neuro:  Mental Status: Lethargic unable to answer any questions.   Able to follow some simple commands ( open eyes, close eyes, stick tongue out, squeeze my hands) Cranial Nerves: PERRL, keeps eyes tightly shut. Tongue protrudes midline, face appears symmetric. No seizure like movements or nystagmus noted on exam Motor/sensory: Able to withdraw and localize to noxious stimuli Tone and bulk:normal tone throughout; no atrophy noted Plantars: Right: downgoing   Left: downgoing Cerebellar: UTA Gait: deferred    Medications:  Scheduled:  atorvastatin  40 mg Oral q1800   enoxaparin (LOVENOX) injection  55 mg Subcutaneous Q12H   levothyroxine  50 mcg Intravenous Daily   Continuous:  levETIRAcetam 500 mg (01/22/20 0813)   YIR:[id number] **OR** acetaminophen (TYLENOL) oral liquid 160 mg/5 mL **OR** acetaminophen, labetalol  Pertinent Labs/Diagnostics:   EEG  Result Date: 01/20/2020 Charlsie Quest, MD     01/21/2020  9:44 AM Patient Name: Crystal Espinoza MRN: [id number] Epilepsy Attending: Charlsie Quest Referring Physician/Provider: Dr Georgiana Spinner Kingsley Farace Date: 01/20/2020 Duration: 23.02 mins Patient  history: 86yo F presented with AMS. On exam noted to have aphasia and possible right-sided neglect. EEG to evaluate for seizure. Level of alertness: lethargic AEDs during EEG study:  Ativan Technical aspects: This EEG study was done with scalp electrodes positioned according to the 10-20 International system of electrode placement. Electrical activity was acquired at a sampling rate of 500Hz  and reviewed with a high frequency filter of 70Hz  and a low frequency filter of 1Hz . EEG data were recorded continuously and digitally stored. DESCRIPTION: EEG showed sharp waves in left frontal region, maximal F3/F7. Continuous generalized low amplitude 2-3Hz  rhythmic delta slowing in left hemisphere was also noted. Additionally, 8-10Hz  polymorphic alpha activity was seen in right hemisphere. There is also an excessive amount of 15 to 18 Hz, 2-3 uV beta activity with irregular morphology distributed symmetrically and diffusely. Hyperventilation and photic stimulation were not performed. Abnormality -Sharp waves, left frontal region, maximal F3/F7 -Continuous slow, generalized and lateralized left hemisphere -Excessive beta, general IMPRESSION: This study showed evidence of epileptogenicity in left frontal region as well as cortical dysfunction in left hemisphere likely secondary to underlying structural abnormality, postictal state.  Additionally there is evidence of severe diffuse encephalopathy, nonspecific etiology. The excessive beta activity seen in the background is most likely due to the effect of benzodiazepine and is a benign EEG pattern.No seizures were seen throughout the recording. Priyanka   CT ANGIO HEAD W OR WO CONTRAST  Result Date: 01/20/2020 CLINICAL DATA:  Code stroke follow-up EXAM: CT ANGIOGRAPHY HEAD AND NECK CT PERFUSION BRAIN TECHNIQUE: Multidetector CT imaging of the head and neck was  performed using the standard protocol during bolus administration of intravenous contrast. Multiplanar CT  image reconstructions and MIPs were obtained to evaluate the vascular anatomy. Carotid stenosis measurements (when applicable) are obtained utilizing NASCET criteria, using the distal internal carotid diameter as the denominator. Multiphase CT imaging of the brain was performed following IV bolus contrast injection. Subsequent parametric perfusion maps were calculated using RAPID software. CONTRAST:  17mL OMNIPAQUE IOHEXOL 350 MG/ML SOLN COMPARISON:  None. FINDINGS: CTA NECK FINDINGS Aortic arch: Mild to moderate calcified plaque along the arch. Great vessel origins are patent. Right carotid system: Common and external carotid arteries are patent. Calcified plaque at the bifurcation. There is noncalcified plaque with occlusion of the cervical ICA just beyond the origin. No reconstitution in the neck. Left carotid system: Patent. Mild calcified plaque at the bifurcation without measurable stenosis. Vertebral arteries: Patent.  Codominant. Skeleton: Degenerative changes of the cervical spine. Other neck: No mass or adenopathy. Upper chest: No apical lung mass. Review of the MIP images confirms the above findings CTA HEAD FINDINGS Anterior circulation: Intracranial right internal carotid is occluded with reconstitution at the level of the communicating segment. Left intracranial internal carotid arteries patent with calcified plaque but no significant stenosis. Anterior and middle cerebral arteries are patent. Posterior circulation: Intracranial vertebral arteries are patent. There is calcified plaque causing moderate stenosis on the left. Basilar artery is patent. Posterior cerebral arteries are patent. Bilateral posterior communicating arteries are present. Venous sinuses: Not well evaluated Review of the MIP images confirms the above findings CT Brain Perfusion Findings: Nondiagnostic due to motion artifact. 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 This is age-indeterminate but probably chronic. Otherwise, no large vessel occlusion or significant stenosis. Caliber of more distal right MCA branches is reduced relative to the left. Nondiagnostic perfusion imaging. Electronically Signed   By: Macy Mis M.D.   On: 01/20/2020 16:55   CT ANGIO NECK W OR WO CONTRAST  Result Date: 01/20/2020 CLINICAL DATA:  Code stroke follow-up EXAM: CT ANGIOGRAPHY HEAD AND NECK CT PERFUSION BRAIN TECHNIQUE: Multidetector CT imaging of the head and neck was performed using the standard protocol during bolus administration of intravenous contrast. Multiplanar CT image reconstructions and MIPs were obtained to evaluate the vascular anatomy. Carotid stenosis measurements (when applicable) are obtained utilizing NASCET criteria, using the distal internal carotid diameter as the denominator. Multiphase CT imaging of the brain was performed following IV bolus contrast injection. Subsequent parametric perfusion maps were calculated using RAPID software. CONTRAST:  173mL OMNIPAQUE IOHEXOL 350 MG/ML SOLN COMPARISON:  None. FINDINGS: CTA NECK FINDINGS Aortic arch: Mild to moderate calcified plaque along the arch. Great vessel origins are patent. Right carotid system: Common and external carotid arteries are patent. Calcified plaque at the bifurcation. There is noncalcified plaque with occlusion of the cervical ICA just beyond the origin. No reconstitution in the neck. Left carotid system: Patent. Mild calcified plaque at the bifurcation without measurable stenosis. Vertebral arteries: Patent.  Codominant. Skeleton: Degenerative changes of the cervical spine. Other neck: No mass or adenopathy. Upper chest: No apical lung mass. Review of the MIP images confirms the above findings CTA HEAD FINDINGS Anterior circulation: Intracranial right internal carotid is occluded with reconstitution at the level of the communicating segment. Left intracranial internal  carotid arteries patent with calcified plaque but no significant stenosis. Anterior and middle cerebral arteries are patent. Posterior circulation: Intracranial vertebral arteries are patent. There is calcified plaque causing  moderate stenosis on the left. Basilar artery is patent. Posterior cerebral arteries are patent. Bilateral posterior communicating arteries are present. Venous sinuses: Not well evaluated Review of the MIP images confirms the above findings CT Brain Perfusion Findings: Nondiagnostic due to motion artifact. 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 This is age-indeterminate but probably chronic. Otherwise, no large vessel occlusion or significant stenosis. Caliber of more distal right MCA branches is reduced relative to the left. Nondiagnostic perfusion imaging. Electronically Signed   By: Guadlupe Spanish M.D.   On: 01/20/2020 16:55   CT CEREBRAL PERFUSION W CONTRAST  Result Date: 01/20/2020 CLINICAL DATA:  Code stroke follow-up EXAM: CT ANGIOGRAPHY HEAD AND NECK CT PERFUSION BRAIN TECHNIQUE: Multidetector CT imaging of the head and neck was performed using the standard protocol during bolus administration of intravenous contrast. Multiplanar CT image reconstructions and MIPs were obtained to evaluate the vascular anatomy. Carotid stenosis measurements (when applicable) are obtained utilizing NASCET criteria, using the distal internal carotid diameter as the denominator. Multiphase CT imaging of the brain was performed following IV bolus contrast injection. Subsequent parametric perfusion maps were calculated using RAPID software. CONTRAST:  OMNIPAQUE IOHEXOL 350 MG/ML SOLN COMPARISON:  None. FINDINGS: CTA NECK FINDINGS Aortic arch: Mild to moderate calcified plaque along the arch. Great vessel origins are patent. Right carotid system: Common and external carotid arteries are patent. Calcified  plaque at the bifurcation. There is noncalcified plaque with occlusion of the cervical ICA just beyond the origin. No reconstitution in the neck. Left carotid system: Patent. Mild calcified plaque at the bifurcation without measurable stenosis. Vertebral arteries: Patent.  Codominant. Skeleton: Degenerative changes of the cervical spine. Other neck: No mass or adenopathy. Upper chest: No apical lung mass. Review of the MIP images confirms the above findings CTA HEAD FINDINGS Anterior circulation: Intracranial right internal carotid is occluded with reconstitution at the level of the communicating segment. Left intracranial internal carotid arteries patent with calcified plaque but no significant stenosis. Anterior and middle cerebral arteries are patent. Posterior circulation: Intracranial vertebral arteries are patent. There is calcified plaque causing moderate stenosis on the left. Basilar artery is patent. Posterior cerebral arteries are patent. Bilateral posterior communicating arteries are present. Venous sinuses: Not well evaluated Review of the MIP images confirms the above findings CT Brain Perfusion Findings: Nondiagnostic due to motion artifact. 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 This is age-indeterminate but probably chronic. Otherwise, no large vessel occlusion or significant stenosis. Caliber of more distal right MCA branches is reduced relative to the left. Nondiagnostic perfusion imaging. Electronically Signed   By: Guadlupe Spanish M.D.   On: 01/20/2020 16:55   DG CHEST PORT 1 VIEW  Result Date: 01/20/2020 CLINICAL DATA:  Shortness of breath EXAM: PORTABLE CHEST 1 VIEW COMPARISON:  04/09/2017 FINDINGS: Cardiomegaly. Left pacer remains in place, unchanged. Left lower lobe retrocardiac opacity, question pneumonia. No confluent opacity on the right. No effusions or acute bony abnormality. IMPRESSION:  Cardiomegaly. Left lower lobe/retrocardiac opacity concerning for pneumonia. Electronically Signed   By: Charlett Nose M.D.   On: 01/20/2020 19:42   Overnight EEG with video  Result Date: 01/21/2020 Charlsie Quest, MD     01/21/2020  1:17 PM Patient Name: Crystal Espinoza MRN: [id number] Epilepsy Attending: Charlsie Quest Referring Physician/Provider: Dr Georgiana Spinner Durant Scibilia Duration: 4/8/201 2203 to 01/21/2020 0947   Patient history: 86yo  F presented with AMS. On exam noted to have aphasia and possible right-sided neglect. EEG to evaluate for seizure.   Level of alertness: lethargic   AEDs during EEG study:  Ativan   Technical aspects: This EEG study was done with scalp electrodes positioned according to the 10-20 International system of electrode placement. Electrical activity was acquired at a sampling rate of 500Hz  and reviewed with a high frequency filter of 70Hz  and a low frequency filter of 1Hz . EEG data were recorded continuously and digitally stored.   DESCRIPTION: EEG showed sharp waves in left frontal region, maximal F3/F7. Continuous generalized and lateralized left hemisphere 3-6hz  theta-delta slowing was noted. Hyperventilation and photic stimulation were not performed.   Abnormality - Sharp waves, left frontal region, maximal F3/F7 - Continuous slow, generalized and lateralized left hemisphere IMPRESSION: This study showed evidence of epileptogenicity in left frontal region as well as cortical dysfunction in left hemisphere likely secondary to underlying structural abnormality, postictal state. Additionally there is evidence of moderate diffuse encephalopathy, nonspecific etiology. No seizures were seen throughout the recording. EEG appears to be improving compared to previous study.    ECHOCARDIOGRAM COMPLETE  Result Date: 01/21/2020    ECHOCARDIOGRAM REPORT   Patient Name:   Crystal Espinoza Date of Exam: 01/21/2020 Medical Rec #:  [id number]         Height:       66.0 in Accession #:    [id number]         Weight:       120.6 lb Date of Birth:  08/03/1929          BSA:          1.613 m Patient Age:    86 years          BP:           142/65 mmHg Patient Gender: F                 HR:           61 bpm. Exam Location:  Inpatient Procedure: 2D Echo, Color Doppler and Cardiac Doppler Indications:    Stroke i163.9  History:        Patient has prior history of Echocardiogram examinations, most                 recent 07/17/2018. Pacemaker, Pulmonary HTN, Arrythmias:RBBB and                 Atrial Fibrillation; Risk Factors:Hypertension.  Sonographer:    Irving BurtonEmily Senior RDCS Referring Phys: [id number] Emeline GeneralPING T ZHANG IMPRESSIONS  1. Left ventricular ejection fraction, by estimation, is 45 to 50%. The left ventricle has mildly decreased function. The left ventricle has no regional wall motion abnormalities. There is mild concentric left ventricular hypertrophy. Left ventricular diastolic function could not be evaluated.  2. Right ventricular systolic function is normal. The right ventricular size is normal. There is normal pulmonary artery systolic pressure. The estimated right ventricular systolic pressure is 28.6 mmHg.  3. Left atrial size was severely dilated.  4. Right atrial size was severely dilated.  5. The mitral valve is normal in structure. Trivial mitral valve regurgitation.  6. The aortic valve is tricuspid. Aortic valve regurgitation is not visualized. Mild aortic valve sclerosis is present, with no evidence of aortic valve stenosis.  7. The inferior vena cava is dilated in size with >50% respiratory variability, suggesting right atrial pressure of 8 mmHg. Comparison(s): Prior images reviewed side by side.  The left ventricular function is worsened. This appears to be largely due to RV apical pacing-induced dyssynchrony (the previous study showed motly ventricular sensed rhythm). The estimated systolic PA pressure has improved. FINDINGS  Left Ventricle: Left ventricular ejection fraction, by estimation, is 45 to 50%. The  left ventricle has mildly decreased function. The left ventricle has no regional wall motion abnormalities. The left ventricular internal cavity size was normal in size. There is mild concentric left ventricular hypertrophy. Abnormal (paradoxical) septal motion, consistent with RV pacemaker. Left ventricular diastolic function could not be evaluated due to atrial fibrillation. Left ventricular diastolic function could not be evaluated. Right Ventricle: The right ventricular size is normal. No increase in right ventricular wall thickness. Right ventricular systolic function is normal. There is normal pulmonary artery systolic pressure. The tricuspid regurgitant velocity is 2.27 m/s, and  with an assumed right atrial pressure of 8 mmHg, the estimated right ventricular systolic pressure is 28.6 mmHg. Left Atrium: Left atrial size was severely dilated. Right Atrium: Right atrial size was severely dilated. Pericardium: There is no evidence of pericardial effusion. Mitral Valve: The mitral valve is normal in structure. Trivial mitral valve regurgitation. Tricuspid Valve: The tricuspid valve is normal in structure. Tricuspid valve regurgitation is trivial. Aortic Valve: The aortic valve is tricuspid. Aortic valve regurgitation is not visualized. Mild aortic valve sclerosis is present, with no evidence of aortic valve stenosis. Pulmonic Valve: The pulmonic valve was grossly normal. Pulmonic valve regurgitation is mild. Aorta: The aortic root and ascending aorta are structurally normal, with no evidence of dilitation. Venous: The inferior vena cava is dilated in size with greater than 50% respiratory variability, suggesting right atrial pressure of 8 mmHg. IAS/Shunts: No atrial level shunt detected by color flow Doppler.  LEFT VENTRICLE PLAX 2D LVIDd:         3.50 cm     Diastology LVIDs:         2.30 cm     LV e' lateral:   8.27 cm/s LV PW:         1.30 cm     LV E/e' lateral: 6.2 LV IVS:        1.20 cm     LV e' medial:     4.90 cm/s LVOT diam:     1.90 cm     LV E/e' medial:  10.5 LV SV:         35 LV SV Index:   22 LVOT Area:     2.84 cm  LV Volumes (MOD) LV vol d, MOD A2C: 48.9 ml LV vol d, MOD A4C: 63.7 ml LV vol s, MOD A2C: 35.3 ml LV vol s, MOD A4C: 36.5 ml LV SV MOD A2C:     13.6 ml LV SV MOD A4C:     63.7 ml LV SV MOD BP:      22.4 ml RIGHT VENTRICLE RV S prime:     9.36 cm/s TAPSE (M-mode): 1.1 cm LEFT ATRIUM             Index       RIGHT ATRIUM           Index LA diam:        3.90 cm 2.42 cm/m  RA Area:     24.00 cm LA Vol (A2C):   90.2 ml 55.92 ml/m RA Volume:   70.70 ml  43.83 ml/m LA Vol (A4C):   82.4 ml 51.09 ml/m LA Biplane Vol: 86.4 ml 53.57 ml/m  AORTIC VALVE  LVOT Vmax:   61.40 cm/s LVOT Vmean:  42.000 cm/s LVOT VTI:    0.125 m  AORTA Ao Root diam: 3.00 cm Ao Asc diam:  3.00 cm MITRAL VALVE               TRICUSPID VALVE MV Area (PHT): 3.31 cm    TR Peak grad:   20.6 mmHg MV Decel Time: 229 msec    TR Vmax:        227.00 cm/s MV E velocity: 51.40 cm/s MV A velocity: 28.70 cm/s  SHUNTS MV E/A ratio:  1.79        Systemic VTI:  0.12 m                            Systemic Diam: 1.90 cm Rachelle Hora Croitoru MD Electronically signed by Thurmon Fair MD Signature Date/Time: 01/21/2020/4:13:30 PM    Final    CT HEAD CODE STROKE WO CONTRAST  Result Date: 01/20/2020 CLINICAL DATA:  Code stroke.  Speech difficulty.  Aphasia. EXAM: CT HEAD WITHOUT CONTRAST TECHNIQUE: Contiguous axial images were obtained from the base of the skull through the vertex without intravenous contrast. COMPARISON:  None. FINDINGS: Brain: Moderate atrophy. Extensive chronic microvascular ischemic changes are present in the white matter and thalamus bilaterally. Negative for acute infarct, hemorrhage, mass. Vascular: Negative for hyperdense vessel Skull: Negative Sinuses/Orbits: Paranasal sinuses clear. Bilateral cataract extraction. Other: None ASPECTS ([place]berta Stroke Program Early CT Score) - Ganglionic level infarction (caudate, lentiform nuclei,  internal capsule, insula, M1-M3 cortex): 7 - Supraganglionic infarction (M4-M6 cortex): 3 Total score (0-10 with 10 being normal): 10 IMPRESSION: 1. No acute abnormality. 2. Atrophy and extensive chronic microvascular ischemia 3. ASPECTS is 10 4. These results were called by telephone at the time of interpretation on 01/20/2020 at 4:22 pm to provider Denham Mose , who verbally acknowledged these results. Electronically Signed   By: Marlan Palau M.D.   On: 01/20/2020 16:23   ASSESSMENT AND PLAN: 86 year old female with history of atrial fibrillation on Xarelto, hypertension who presented with altered mental status.  On exam patient was noted to be aphasic and possibly right-sided neglect.  CT head did not show any acute abnormalities.  Stat EEG was performed which showed sharp waves in left frontal region as well as slowing in the left hemisphere.   New onset seizure, unprovoked Acute post ictal encephalopathy Hyperlipidemia Hyperbilirubinemia Atrial fibrillation on anticoagulation -EEG showed sharp waves in the frontal region as well as slowing in left hemisphere.  Along with patient's presentation of aphasia and possible right-sided neglect with normal CT head this is most likely suggestive of seizure.  -No leukocytosis, no evidence of UTI, no evidence of any other seizure provoking factors.  Was hypertensive with blood pressure 180/83 on arrival.  However no evidence of PRES on CT head.. -As patient is already improving, low suspicion for meningitis/encephalitis   Recommendations -  continue Keppra 250 mg twice daily.  -If patient is very sedated on this dose, can consider reducing to 250 mg in the morning and 500 mg at bedtime. -Already on anticoagulation for atrial fibrillation, started atorvastatin 40mg  daily for hyperlipidemia. -would like MRI, but patient pacemaker incompatible. Repeat CT head  -Continue seizure precautions, goal systolic blood pressure normotensive -As needed IV Ativan 2 mg for  generalized tonic-clonic seizure lasting 1 to 2 minutes of focal seizure lasting more than 5 minutes -Follow-up with neurology in 8 to 12 weeks after discharge.  Valentina Lucks, MSN, NP-C [facility] [phone]  Attending neurologist's note to follow  01/22/2020, 9:21 AM   NEUROHOSPITALIST ADDENDUM Performed a face to face diagnostic evaluation.   I have reviewed the contents of history and physical exam as documented by PA/ARNP/Resident and agree with above documentation.  I have discussed and formulated the above plan as documented. Edits to the note have been made as needed.   Patient lethargic today, arousable and does answer orientation questions but falls back to sleep.  Moves all 4 extremities antigravity, no lateralizing deficits.  Will decrease Keppra to see if this is responsible for altered mental status We will repeat CT head is unable to get MRI brain.  Patient was placed on Lovenox for anticoagulation as she failed swallow eval due to lethargy.     Georgiana Spinner Lillis Nuttle MD [facility] [phone]   If 7pm to 7am, please call on call as listed on AMION.

## 2020-01-22 NOTE — Evaluation (Signed)
Speech Language Pathology Evaluation Patient Details Name: RAYANNA MATUSIK MRN: [id number] DOB: 04/20/1929 Today's Date: 01/22/2020 Time: 6160-7371 SLP Time Calculation (min) (ACUTE ONLY): 17 min  Problem List:  Patient Active Problem List   Diagnosis Date Noted  . Seizure ([place])   . CVA (cerebral vascular accident) ([place]) 01/20/2020  . AMS (altered mental status) 01/20/2020  . SOB (shortness of breath)   . Pacemaker 06/30/2019  . Symptomatic bradycardia 04/08/2017  . Paroxysmal atrial fibrillation ([place]) 02/02/2017  . RBBB 02/27/2014  . PAC (premature atrial contraction) 02/27/2014  . CREST syndrome ([place]) 02/27/2014   Past Medical History:  Past Medical History:  Diagnosis Date  . Cataract   . CREST syndrome ([place])   . Hypertension   . Nausea   . Paroxysmal atrial fibrillation ([place]) 02/02/2017  . Severe pulmonary hypertension ([place])    on cath 02/2017  . Symptomatic bradycardia 04/08/2017   Past Surgical History:  Past Surgical History:  Procedure Laterality Date  . ABDOMINAL HYSTERECTOMY    . EYE SURGERY    . PACEMAKER IMPLANT N/A 04/08/2017   Procedure: Pacemaker Implant;  Surgeon: Evans Lance, MD;  Location: [facility];  Service: Cardiovascular;  Laterality: N/A;   HPI:  86 year old female with PMH of paroxysmal A. fib/atrial flutter on Xarelto, symptomatic bradycardia s/p PPM, severe pulmonary hypertension, hypertension, presented to the ED due to confusion, expressive aphasia and possible right-sided neglect.  Admitted for suspected acute stroke versus seizure.  Head CT was clear.  MRI has not been performed as of 4/10.     Assessment / Plan / Recommendation Clinical Impression  Pt was seen for a cognitive-linguistic evaluation in the setting of a possible CVA.  This evaluation was abbreviated secondary to pt's lethargy.  She was oriented to herself, but she was not oriented to place, situation, or year despite being given a choice of two.  Pt exhibited  difficulty following 1-step commands and she exhibited reduced speech intelligibility at the word, phrase, sentence, and conversation level secondary to reduced articulatory precision.  Pt was also noted to have a low vocal volume which contributed to reduced intelligibility.  Her speech was approximately 75% intelligible to an unfamiliar listener.  Pt was able to name 5/5 items in the room and she was able to participate in simple conversation.  She demonstrated deficits in memory, attention, executive functions, and safety judgement.  No family was present during this evaluation to help determine the pt's cognitive-linguistic baseline.  Recommend that pt continue with skilled ST at a SNF or that she receive 24/7 supervision and assistance with IALDs (medications, finances, etc.) at time of discharge.  SLP will f/u for diagnostic treatment per POC.     SLP Assessment  SLP Recommendation/Assessment: Patient needs continued Speech Lanaguage Pathology Services SLP Visit Diagnosis: Cognitive communication deficit (R41.841)    Follow Up Recommendations  24 hour supervision/assistance;Skilled Nursing facility    Frequency and Duration min 2x/week  2 weeks      SLP Evaluation Cognition  Overall Cognitive Status: No family/caregiver present to determine baseline cognitive functioning Arousal/Alertness: Lethargic Orientation Level: Oriented to person;Disoriented to place;Disoriented to time;Disoriented to situation Attention: Sustained Sustained Attention: Impaired Sustained Attention Impairment: Verbal basic Memory: Impaired Memory Impairment: Decreased short term memory Decreased Short Term Memory: Verbal basic Problem Solving: Impaired Problem Solving Impairment: Verbal basic Safety/Judgment: Impaired       Comprehension  Auditory Comprehension Overall Auditory Comprehension: Impaired Yes/No Questions: Not tested Commands: Impaired One Step Basic Commands: 50-74%  accurate Conversation:  Simple Reading Comprehension Reading Status: Not tested    Expression Expression Primary Mode of Expression: Verbal Verbal Expression Overall Verbal Expression: Other (comment)(difficult to assess ) Naming: No impairment Written Expression Dominant Hand: Right Written Expression: Not tested   Oral / Motor  Oral Motor/Sensory Function Overall Oral Motor/Sensory Function: Other (comment)(Pt unable to follow all commands for OME ) Motor Speech Overall Motor Speech: Impaired Respiration: Within functional limits Phonation: Normal Resonance: Within functional limits Articulation: Impaired Level of Impairment: Word Intelligibility: Intelligibility reduced Word: 75-100% accurate Phrase: 50-74% accurate Sentence: 50-74% accurate Conversation: 50-74% accurate Motor Planning: Witnin functional limits   GO                   Villa Herb., M.S., CCC-SLP Acute Rehabilitation Services Office: [phone]  Shanon Rosser Frankye Schwegel 01/22/2020, 9:08 AM

## 2020-01-23 LAB — CBC
HCT: 45.2 % (ref 36.0–46.0)
Hemoglobin: 14.4 g/dL (ref 12.0–15.0)
MCH: 34 pg (ref 26.0–34.0)
MCHC: 31.9 g/dL (ref 30.0–36.0)
MCV: 106.9 fL — ABNORMAL HIGH (ref 80.0–100.0)
Platelets: 95 10*3/uL — ABNORMAL LOW (ref 150–400)
RBC: 4.23 MIL/uL (ref 3.87–5.11)
RDW: 15.6 % — ABNORMAL HIGH (ref 11.5–15.5)
WBC: 5.5 10*3/uL (ref 4.0–10.5)
nRBC: 0 % (ref 0.0–0.2)

## 2020-01-23 LAB — SARS CORONAVIRUS 2 (TAT 6-24 HRS): SARS Coronavirus 2: NEGATIVE

## 2020-01-23 MED ORDER — LORAZEPAM 2 MG/ML IJ SOLN
0.5000 mg | Freq: Once | INTRAMUSCULAR | Status: AC
  Administered 2020-01-23: 0.5 mg via INTRAVENOUS
  Filled 2020-01-23: qty 1

## 2020-01-23 MED ORDER — LEVOTHYROXINE SODIUM 100 MCG PO TABS
100.0000 ug | ORAL_TABLET | Freq: Every day | ORAL | Status: DC
  Administered 2020-01-23 – 2020-01-25 (×3): 100 ug via ORAL
  Filled 2020-01-23 (×3): qty 1

## 2020-01-23 MED ORDER — AMLODIPINE BESYLATE 5 MG PO TABS
5.0000 mg | ORAL_TABLET | Freq: Every day | ORAL | Status: DC
  Administered 2020-01-23 – 2020-01-25 (×3): 5 mg via ORAL
  Filled 2020-01-23 (×3): qty 1

## 2020-01-23 NOTE — Progress Notes (Signed)
PROGRESS NOTE   Crystal Espinoza  YIR:[id number]    DOB: [birth year]    DOA: 01/20/2020  PCP: Merri Brunette, MD   I have briefly reviewed patients previous medical records in [facility].  Chief Complaint:   Chief Complaint  Patient presents with  . Code Stroke    Brief Narrative:  86 year old female with PMH of paroxysmal A. fib/atrial flutter on Xarelto, symptomatic bradycardia s/p PPM, severe pulmonary hypertension, hypertension, presented to the ED due to confusion, expressive aphasia and possible right-sided neglect.  Evaluated by neurology in ED.  Admitted for suspected acute stroke versus seizure.  Treated with AEDs.  Reduced dose due to lethargy.  Improved today.   Assessment & Plan:  Active Problems:   CVA (cerebral vascular accident) (H[facility])   AMS (altered mental status)   Seizure (H[facility])   New onset seizure, unprovoked: CT head code stroke: No acute abnormality.  Atrophy and extensive chronic microvascular ischemia.  CTA head and neck and CTP: 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,  Probably chronic.  Otherwise no large vessel occlusion or significant stenosis.  Nondiagnostic perfusion imaging.  Unable to perform MRI due to PPM incompatibility.  Stat EEG showed sharp waves in left frontal region as well as slowing in the left hemisphere.  As per neurology, presentation of aphasia, possible right-sided neglect with normal CT head most likely suggestive of seizures.  No features suggestive of infectious causes as precipitant.  Hypertensive with BP 180/83 on arrival but no evidence of PRES on CT head.  Low index of suspicion for meningitis/encephalitis.  Patient was loaded with Keppra, continued Keppra 500 mg twice daily. She is on Xarelto for anticoagulation, atorvastatin 40 mg daily started for hyperlipidemia.  Seizure precautions.  Control blood pressure.  Outpatient follow-up with neurology in  8 to 12 weeks after discharge.    Due to lethargy, Keppra dose was reduced to 250 twice daily yesterday.  Mental status has improved.  Continue current dose.  CT head repeated and negative for acute infarct. As per ST, now on dysphagia 1 diet and thin liquids.  Acute encephalopathy: Secondary to postictal phase and Keppra.  Improved today, continue to monitor..  Atrial flutter: Xarelto had been changed to Lovenox on admission while she was n.p.o.  As communicated with pharmacy, transitioning to Xarelto tonight.  Admitting [facility] MD curbside consulted cardiology who read patient's EKG and compared to her past EKG, and [employer] was contacted to investigate pacemaker, conclusion is atrial flutter is chronic and pacemaker working well (setting as 60 bpm to cocaine).  Back on Xarelto since 4/10.  Essential hypertension: Reasonable inpatient control.  Blood pressure starting to increase, consider resuming antihypertensives.  Hypothyroidism: IV levothyroxine changed to p.o.  Cyanosis: Present on admission.  Looks more central with right to left shunt.  Probably cor pulmonale.  pH 7.436, PCO2 29.7, PO2 92, bicarbonate 21 and oxygen saturation 98%.:  LVEF 45-50% no regional wall motion abnormalities.  Mild LVH.  Mostly in fingers and toes.  I discussed in detail with patient's son and spouse at bedside and this reportedly has been ongoing for the last 8 months, PCP aware and evaluated, uses stockings for lower extremities.  Outpatient follow-up with PCP.  Body mass index is 19.46 kg/m.   Thrombocytopenia: Stable.  Follow CBC.  LLL opacity on chest x-ray: No clinical pneumonia.  Afebrile, no leukocytosis.  Monitor off of antibiotics.  Follow chest x-ray in 4  weeks.   DVT prophylaxis: Lovenox Code Status: Full Family Communication: Discussed in detail with patient's spouse and son at bedside, updated care and answered questions. Disposition:  . Patient came from: Home           . Anticipated d/c  place: SNF . Barriers to d/c: Improving mental status changes.  Hopefully will be stable for DC 4/12 to SNF pending bed and insurance.   Consultants:   Neurology  Procedures:   None  Antimicrobials:   None   Subjective:  Interviewed and examined along with family at bedside.  As per son, mental status is about 60% better.  More alert, appropriate and interactive.  States that she is not interested in going to SNF but family able to convince otherwise.  Objective:   Vitals:   01/22/20 2329 01/23/20 0418 01/23/20 0747 01/23/20 1239  BP: (!) 163/82 (!) 169/83 (!) 152/69 124/75  Pulse: 63 64 60 (!) 59  Resp: 20 19 16 18   Temp: 98 F (36.7 C) 97.7 F (36.5 C) (!) 97.5 F (36.4 C) 98 F (36.7 C)  TempSrc: Oral Oral Oral   SpO2: 100% 100% 100% 99%  Weight:      Height:        General exam: Elderly female, moderately built and frail, lying comfortably propped up in bed.  Oral mucosa moist.  Looks much improved compared to last 2 days. Respiratory system: Clear to auscultation.  No increased work of breathing. Cardiovascular system: S1 & S2 heard, RRR. No JVD, murmurs, rubs, gallops or clicks. No pedal edema.  Telemetry personally reviewed: Atrial flutter with controlled ventricular rate and on demand V paced rhythm. Gastrointestinal system: Abdomen is nondistended, soft and nontender. No organomegaly or masses felt. Normal bowel sounds heard. Central nervous system: Alert and oriented to self, place and to family.  Follows simple instructions. No focal neurological deficits. Extremities: Symmetric 5 x 5 power.  Fingers cool with mild tip of finger cyanosis, no different than last 2 days.  Symmetric normal radial pulses.  No significant cyanosis of toes.  Bilateral dorsalis pedis felt. Skin: No rashes, lesions or ulcers Psychiatry: Judgement and insight impaired but better compared to yesterday. Mood & affect pleasant.     Data Reviewed:   I have personally reviewed following  labs and imaging studies   CBC: Recent Labs  Lab 01/20/20 1558 01/20/20 1607 01/21/20 0341 01/22/20 0531 01/23/20 0332  WBC 4.2   < > 6.0 6.8 5.5  NEUTROABS 3.1  --  4.3  --   --   HGB 14.5   < > 13.9 13.4 14.4  HCT 46.4*   < > 42.8 41.7 45.2  MCV 107.9*   < > 106.2* 106.6* 106.9*  PLT 123*   < > 129* 111* 95*   < > = values in this interval not displayed.    Basic Metabolic Panel: Recent Labs  Lab 01/20/20 1558 01/20/20 1558 01/20/20 1607 01/20/20 1937 01/22/20 0531  NA 138   < > 136 135 139  K 4.3   < > 4.0 4.3 3.9  CL 104  --  103  --  104  CO2 20*  --   --   --  20*  GLUCOSE 111*  --  109*  --  77  BUN 11  --  12  --  9  CREATININE 0.80  --  0.80  --  0.79  CALCIUM 9.1  --   --   --  8.7*   < > =  values in this interval not displayed.    Liver Function Tests: Recent Labs  Lab 01/20/20 1558  AST 22  ALT 13  ALKPHOS 104  BILITOT 1.3*  PROT 6.5  ALBUMIN 3.8    CBG: Recent Labs  Lab 01/20/20 1602  GLUCAP 105*    Microbiology Studies:   Recent Results (from the past 240 hour(s))  Respiratory Panel by RT PCR (Flu A&B, Covid) - Nasopharyngeal Swab     Status: None   Collection Time: 01/20/20  4:35 PM   Specimen: Nasopharyngeal Swab  Result Value Ref Range Status   SARS Coronavirus 2 by RT PCR NEGATIVE NEGATIVE Final    Comment: (NOTE) SARS-CoV-2 target nucleic acids are NOT DETECTED. The SARS-CoV-2 RNA is generally detectable in upper respiratoy specimens during the acute phase of infection. The lowest concentration of SARS-CoV-2 viral copies this assay can detect is 131 copies/mL. A negative result does not preclude SARS-Cov-2 infection and should not be used as the sole basis for treatment or other patient management decisions. A negative result may occur with  improper specimen collection/handling, submission of specimen other than nasopharyngeal swab, presence of viral mutation(s) within the areas targeted by this assay, and inadequate  number of viral copies (<131 copies/mL). A negative result must be combined with clinical observations, patient history, and epidemiological information. The expected result is Negative. Fact Sheet for Patients:  https://www.moore.com/ Fact Sheet for Healthcare Providers:  https://www.young.biz/ This test is not yet ap proved or cleared by the [place] FDA and  has been authorized for detection and/or diagnosis of SARS-CoV-2 by FDA under an Emergency Use Authorization (EUA). This EUA will remain  in effect (meaning this test can be used) for the duration of the COVID-19 declaration under Section 564(b)(1) of the Act, 21 U.S.C. section 360bbb-3(b)(1), unless the authorization is terminated or revoked sooner.    Influenza A by PCR NEGATIVE NEGATIVE Final   Influenza B by PCR NEGATIVE NEGATIVE Final    Comment: (NOTE) The Xpert Xpress SARS-CoV-2/FLU/RSV assay is intended as an aid in  the diagnosis of influenza from Nasopharyngeal swab specimens and  should not be used as a sole basis for treatment. Nasal washings and  aspirates are unacceptable for Xpert Xpress SARS-CoV-2/FLU/RSV  testing. Fact Sheet for Patients: https://www.moore.com/ Fact Sheet for Healthcare Providers: https://www.young.biz/ This test is not yet approved or cleared by the [place] FDA and  has been authorized for detection and/or diagnosis of SARS-CoV-2 by  FDA under an Emergency Use Authorization (EUA). This EUA will remain  in effect (meaning this test can be used) for the duration of the  Covid-19 declaration under Section 564(b)(1) of the Act, 21  U.S.C. section 360bbb-3(b)(1), unless the authorization is  terminated or revoked. Performed at [facility], [address] [place], [place] [address]      Radiology Studies:  CT HEAD WO CONTRAST  Result Date: 01/22/2020 CLINICAL DATA:  86 year old female code  stroke presentation two days ago with age indeterminate cervical right ICA occlusion. EXAM: CT HEAD WITHOUT CONTRAST TECHNIQUE: Contiguous axial images were obtained from the base of the skull through the vertex without intravenous contrast. COMPARISON:  CT head, CTA and CTP 4 /8/21. FINDINGS: Brain: Advanced hypodensity in the bilateral cerebral white matter and deep gray nuclei appears stable. Arachnoid granulation versus small area of chronic encephalomalacia at the right lateral 6 cerebellum 6 is stable. No acute intracranial hemorrhage identified. No midline shift, mass effect, or evidence of intracranial mass lesion. No  ventriculomegaly. No cortically based acute infarct identified. No suspicious intracranial vascular hyperdensity. Vascular: Extensive Calcified atherosclerosis at the skull base. Skull: No acute osseous abnormality identified. Sinuses/Orbits: Visualized paranasal sinuses and mastoids are stable and well pneumatized. Other: No acute orbit or scalp soft tissue finding. IMPRESSION: 1. Stable non contrast CT appearance of the brain. No acute or evolving infarct identified. 2. Advanced chronic small vessel disease. Electronically Signed   By: Genevie Ann M.D.   On: 01/22/2020 19:21     Scheduled Meds:   . atorvastatin  40 mg Oral q1800  . levothyroxine  100 mcg Oral Q0600  . rivaroxaban  15 mg Oral Q supper    Continuous Infusions:   . levETIRAcetam 250 mg (01/23/20 0836)     LOS: 3 days     Vernell Leep, MD, [place], [facility]. [facility]    To contact the attending provider between 7A-7P or the covering provider during after hours 7P-7A, please log into the web site [email or web address] and access using universal [place] password for that web site. If you do not have the password, please call the hospital operator.  01/23/2020, 3:08 PM

## 2020-01-23 NOTE — Progress Notes (Addendum)
NEURO HOSPITALIST PROGRESS NOTE   Subjective: awake alert, NAD. Eating breakfast. Much more awake and alert today.  Exam: Vitals:   01/23/20 0418 01/23/20 0747  BP: (!) 169/83 (!) 152/69  Pulse: 64 60  Resp: 19 16  Temp: 97.7 F (36.5 C) (!) 97.5 F (36.4 C)  SpO2: 100% 100%    Physical Exam  Constitutional: Appears well-developed and well-nourished.  Psych: Affect appropriate to situation Eyes: Normal external eye and conjunctiva. HENT: Normocephalic, no lesions, without obvious abnormality. Mucous membranes dry  Musculoskeletal-no joint tenderness, deformity or swelling Cardiovascular: Normal rate and regular rhythm.  Respiratory: Effort normal, non-labored breathing saturations WNL on [place] GI: Soft.  No distension. There is no tenderness.  Skin: WDI   Neuro:  Mental Status: Awake, alert, able to follow commands. Oriented to name/age/ but not month and year. Cranial Nerves: PERRL, EOEMI Tongue protrudes midline, face appears symmetric. No seizure like movements or nystagmus noted on exam. Shoulder shrug equal Motor/sensory: Able to raise all 4 anti gravity Plantars: Right: downgoing   Left: downgoing Cerebellar: No ataxia noted Gait: deferred    Medications:  Scheduled: . atorvastatin  40 mg Oral q1800  . levothyroxine  100 mcg Oral Q0600  . rivaroxaban  15 mg Oral Q supper   Continuous: . levETIRAcetam 250 mg (01/23/20 0836)   SMO:[id number] **OR** acetaminophen (TYLENOL) oral liquid 160 mg/5 mL **OR** acetaminophen, labetalol  Pertinent Labs/Diagnostics:   CT HEAD WO CONTRAST  Result Date: 01/22/2020 CLINICAL DATA:  86 year old female code stroke presentation two days ago with age indeterminate cervical right ICA occlusion. EXAM: CT HEAD WITHOUT CONTRAST TECHNIQUE: Contiguous axial images were obtained from the base of the skull through the vertex without intravenous contrast. COMPARISON:  CT head, CTA and CTP 4 /8/21. FINDINGS:  Brain: Advanced hypodensity in the bilateral cerebral white matter and deep gray nuclei appears stable. Arachnoid granulation versus small area of chronic encephalomalacia at the right lateral 6 cerebellum 6 is stable. No acute intracranial hemorrhage identified. No midline shift, mass effect, or evidence of intracranial mass lesion. No ventriculomegaly. No cortically based acute infarct identified. No suspicious intracranial vascular hyperdensity. Vascular: Extensive Calcified atherosclerosis at the skull base. Skull: No acute osseous abnormality identified. Sinuses/Orbits: Visualized paranasal sinuses and mastoids are stable and well pneumatized. Other: No acute orbit or scalp soft tissue finding. IMPRESSION: 1. Stable non contrast CT appearance of the brain. No acute or evolving infarct identified. 2. Advanced chronic small vessel disease. Electronically Signed   By: Genevie Ann M.D.   On: 01/22/2020 19:21   ECHOCARDIOGRAM COMPLETE  Result Date: 01/21/2020    ECHOCARDIOGRAM REPORT   Patient Name:   Crystal Espinoza Date of Exam: 0[birth year] Medical Rec #:  [id number]         Height:       66.0 in Accession #:    [id number]        Weight:       120.6 lb Date of Birth:  09/06/1929          BSA:          1.613 m Patient Age:    76 years          BP:           142/65 mmHg Patient Gender: F  HR:           61 bpm. Exam Location:  Inpatient Procedure: 2D Echo, Color Doppler and Cardiac Doppler Indications:    Stroke i163.9  History:        Patient has prior history of Echocardiogram examinations, most                 recent 07/17/2018. Pacemaker, Pulmonary HTN, Arrythmias:RBBB and                 Atrial Fibrillation; Risk Factors:Hypertension.  Sonographer:    Irving Burton Senior RDCS Referring Phys: [id number] Emeline General IMPRESSIONS  1. Left ventricular ejection fraction, by estimation, is 45 to 50%. The left ventricle has mildly decreased function. The left ventricle has no regional wall motion abnormalities. There  is mild concentric left ventricular hypertrophy. Left ventricular diastolic function could not be evaluated.  2. Right ventricular systolic function is normal. The right ventricular size is normal. There is normal pulmonary artery systolic pressure. The estimated right ventricular systolic pressure is 28.6 mmHg.  3. Left atrial size was severely dilated.  4. Right atrial size was severely dilated.  5. The mitral valve is normal in structure. Trivial mitral valve regurgitation.  6. The aortic valve is tricuspid. Aortic valve regurgitation is not visualized. Mild aortic valve sclerosis is present, with no evidence of aortic valve stenosis.  7. The inferior vena cava is dilated in size with >50% respiratory variability, suggesting right atrial pressure of 8 mmHg. Comparison(s): Prior images reviewed side by side. The left ventricular function is worsened. This appears to be largely due to RV apical pacing-induced dyssynchrony (the previous study showed motly ventricular sensed rhythm). The estimated systolic PA pressure has improved. FINDINGS  Left Ventricle: Left ventricular ejection fraction, by estimation, is 45 to 50%. The left ventricle has mildly decreased function. The left ventricle has no regional wall motion abnormalities. The left ventricular internal cavity size was normal in size. There is mild concentric left ventricular hypertrophy. Abnormal (paradoxical) septal motion, consistent with RV pacemaker. Left ventricular diastolic function could not be evaluated due to atrial fibrillation. Left ventricular diastolic function could not be evaluated. Right Ventricle: The right ventricular size is normal. No increase in right ventricular wall thickness. Right ventricular systolic function is normal. There is normal pulmonary artery systolic pressure. The tricuspid regurgitant velocity is 2.27 m/s, and  with an assumed right atrial pressure of 8 mmHg, the estimated right ventricular systolic pressure is 28.6  mmHg. Left Atrium: Left atrial size was severely dilated. Right Atrium: Right atrial size was severely dilated. Pericardium: There is no evidence of pericardial effusion. Mitral Valve: The mitral valve is normal in structure. Trivial mitral valve regurgitation. Tricuspid Valve: The tricuspid valve is normal in structure. Tricuspid valve regurgitation is trivial. Aortic Valve: The aortic valve is tricuspid. Aortic valve regurgitation is not visualized. Mild aortic valve sclerosis is present, with no evidence of aortic valve stenosis. Pulmonic Valve: The pulmonic valve was grossly normal. Pulmonic valve regurgitation is mild. Aorta: The aortic root and ascending aorta are structurally normal, with no evidence of dilitation. Venous: The inferior vena cava is dilated in size with greater than 50% respiratory variability, suggesting right atrial pressure of 8 mmHg. IAS/Shunts: No atrial level shunt detected by color flow Doppler.  LEFT VENTRICLE PLAX 2D LVIDd:         3.50 cm     Diastology LVIDs:         2.30 cm     LV  e' lateral:   8.27 cm/s LV PW:         1.30 cm     LV E/e' lateral: 6.2 LV IVS:        1.20 cm     LV e' medial:    4.90 cm/s LVOT diam:     1.90 cm     LV E/e' medial:  10.5 LV SV:         35 LV SV Index:   22 LVOT Area:     2.84 cm  LV Volumes (MOD) LV vol d, MOD A2C: 48.9 ml LV vol d, MOD A4C: 63.7 ml LV vol s, MOD A2C: 35.3 ml LV vol s, MOD A4C: 36.5 ml LV SV MOD A2C:     13.6 ml LV SV MOD A4C:     63.7 ml LV SV MOD BP:      22.4 ml RIGHT VENTRICLE RV S prime:     9.36 cm/s TAPSE (M-mode): 1.1 cm LEFT ATRIUM             Index       RIGHT ATRIUM           Index LA diam:        3.90 cm 2.42 cm/m  RA Area:     24.00 cm LA Vol (A2C):   90.2 ml 55.92 ml/m RA Volume:   70.70 ml  43.83 ml/m LA Vol (A4C):   82.4 ml 51.09 ml/m LA Biplane Vol: 86.4 ml 53.57 ml/m  AORTIC VALVE LVOT Vmax:   61.40 cm/s LVOT Vmean:  42.000 cm/s LVOT VTI:    0.125 m  AORTA Ao Root diam: 3.00 cm Ao Asc diam:  3.00 cm MITRAL  VALVE               TRICUSPID VALVE MV Area (PHT): 3.31 cm    TR Peak grad:   20.6 mmHg MV Decel Time: 229 msec    TR Vmax:        227.00 cm/s MV E velocity: 51.40 cm/s MV A velocity: 28.70 cm/s  SHUNTS MV E/A ratio:  1.79        Systemic VTI:  0.12 m                            Systemic Diam: 1.90 cm Rachelle Hora Croitoru MD Electronically signed by Thurmon Fair MD Signature Date/Time: 01/21/2020/4:13:30 PM    Final    ASSESSMENT AND PLAN: 86 year old female with history of atrial fibrillation on Xarelto, hypertension who presented with altered mental status.  On exam patient was noted to be aphasic and possibly right-sided neglect.  CT head did not show any acute abnormalities.  Stat EEG was performed which showed sharp waves in left frontal region as well as slowing in the left hemisphere. Repeat head CT on 4/10 was unremarkable.   New onset seizure, unprovoked Acute post ictal encephalopathy Hyperlipidemia Hyperbilirubinemia Atrial fibrillation on anticoagulation -EEG showed sharp waves in the frontal region as well as slowing in left hemisphere.  Along with patient's presentation of aphasia and possible right-sided neglect with normal CT head this is most likely suggestive of seizure.  -No leukocytosis, no evidence of UTI, no evidence of any other seizure provoking factors.  Was hypertensive with blood pressure 180/83 on arrival.  However no evidence of PRES on CT head.. -As patient is already improving, low suspicion for meningitis/encephalitis   Recommendations -  continue Keppra 250 mg twice  daily. . -Already on anticoagulation for atrial fibrillation, started atorvastatin 40mg  daily for hyperlipidemia. -Repeat CT head did not show any acute stroke -As needed IV Ativan 2 mg for generalized tonic-clonic seizure lasting 1 to 2 minutes of focal seizure lasting more than 5 minutes --repeat PT OT evaluation -Follow-up with neurology in 8 to 12 weeks after discharge.    , MSN,  NP-C [facility] [phone]  Attending neurologist's note to follow  01/23/2020, 11:21 AM  NEUROHOSPITALIST ADDENDUM Performed a face to face diagnostic evaluation.   I have reviewed the contents of history and physical exam as documented by PA/ARNP/Resident and agree with above documentation.  I have discussed and formulated the above plan as documented. Edits to the note have been made as needed.  86 year old female who presented with altered mental status likely secondary to seizure with postictal state.  EEG showed left frontal sharp waves.  Lethargic yesterday, Keppra dose was reduced.  Patient has gradually improved, alert and oriented x3 today having breakfast.  CT head was repeated yesterday as we are unable to get MRI brain -negative for acute infarct.     92 Mirza Fessel MD [facility] Georgiana Spinner   If 7pm to 7am, please call on call as listed on AMION.

## 2020-01-23 NOTE — NC FL2 (Signed)
[place] LEVEL OF CARE SCREENING TOOL     IDENTIFICATION  Patient Name: Crystal Espinoza Birthdate: [birth year] Sex: female Admission Date (Current Location): 01/20/2020  [facility] and [place] Number:  Herbalist and Address:  [facility] [place], [place] [address], [place],  [address]      Provider Number: [id number]  Attending Physician Name and Address:  Modena Jansky, MD  Relative Name and Phone Number:  Herbie Baltimore    Current Level of Care: Hospital Recommended Level of Care: [place] Prior Approval Number:    Date Approved/Denied:   PASRR Number: [id number]  Discharge Plan: SNF    Current Diagnoses: Patient Active Problem List   Diagnosis Date Noted  . Seizure ([place])   . CVA (cerebral vascular accident) ([place]) 01/20/2020  . AMS (altered mental status) 01/20/2020  . SOB (shortness of breath)   . Pacemaker 06/30/2019  . Symptomatic bradycardia 04/08/2017  . Paroxysmal atrial fibrillation ([place]) 02/02/2017  . RBBB 02/27/2014  . PAC (premature atrial contraction) 02/27/2014  . CREST syndrome ([place]) 02/27/2014    Orientation RESPIRATION BLADDER Height & Weight     Self  Normal Incontinent, External catheter Weight: 120 lb 9.5 oz (54.7 kg) Height:  5\' 6"  (167.6 cm)  BEHAVIORAL SYMPTOMS/MOOD NEUROLOGICAL BOWEL NUTRITION STATUS      Continent Diet(See discharge summary)  AMBULATORY STATUS COMMUNICATION OF NEEDS Skin   Extensive Assist Verbally Normal                       Personal Care Assistance Level of Assistance  Bathing, Dressing, Feeding Bathing Assistance: Maximum assistance Feeding assistance: Limited assistance Dressing Assistance: Maximum assistance     Functional Limitations Info  Sight, Speech, Hearing   Hearing Info: Adequate Speech Info: Adequate    SPECIAL CARE FACTORS FREQUENCY  PT (By licensed PT), OT (By licensed OT), Speech therapy     PT Frequency: 5x a week OT  Frequency: 5x a week     Speech Therapy Frequency: 2x a week      Contractures Contractures Info: Not present    Additional Factors Info  Code Status, Allergies Code Status Info: Full Allergies Info: Levaquin (Levofloxacin)           Current Medications (01/23/2020):  This is the current hospital active medication list Current Facility-Administered Medications  Medication Dose Route Frequency Provider Last Rate Last Admin  . acetaminophen (TYLENOL) tablet 650 mg  650 mg Oral Q4H PRN Wynetta Fines T, MD   650 mg at 01/23/20 1124   Or  . acetaminophen (TYLENOL) 160 MG/5ML solution 650 mg  650 mg Per Tube Q4H PRN Wynetta Fines T, MD       Or  . acetaminophen (TYLENOL) suppository 650 mg  650 mg Rectal Q4H PRN Wynetta Fines T, MD      . atorvastatin (LIPITOR) tablet 40 mg  40 mg Oral q1800 Lora Havens, MD   40 mg at 01/22/20 1720  . labetalol (NORMODYNE) injection 5 mg  5 mg Intravenous Q4H PRN Wynetta Fines T, MD      . levETIRAcetam (KEPPRA) 250 mg in sodium chloride 0.9 % 100 mL IVPB  250 mg Intravenous Q12H Aroor, Lanice Schwab, MD 410 mL/hr at 01/23/20 0836 250 mg at 01/23/20 0836  . levothyroxine (SYNTHROID) tablet 100 mcg  100 mcg Oral Q0600 Modena Jansky, MD   100 mcg at 01/23/20 1119  . Rivaroxaban (XARELTO) tablet 15 mg  15 mg Oral Q supper Elease Etienne, MD   15 mg at 01/22/20 2146     Discharge Medications: Please see discharge summary for a list of discharge medications.  Relevant Imaging Results:  Relevant Lab Results:   Additional Information SSN [id number]  Dannette Barbara Bertram, [place]

## 2020-01-23 NOTE — TOC Initial Note (Addendum)
Transition of Care [facility]) - Initial/Assessment Note    Patient Details  Name: Crystal Espinoza MRN: [id number] Date of Birth: [birth year]  Transition of Care [facility]) CM/SW Contact:    Nonda Lou, LCSWA Phone Number: 01/23/2020, 12:36 PM  Clinical Narrative:                 CSW received consult for possible SNF placement at time of discharge. Patient currently disoriented x3. CSW made telephone call to patient's spouse Molly Maduro regarding PT recommendation of SNF placement at time of discharge.  Patient's spouse expressed he is agreeable to SNF placement at time of discharge. Patient reports preference for a SNF close to patient's [place] home. CSW discussed insurance authorization process and provided Medicare SNF ratings list at patient's bedside. No further questions reported at this time. CSW to continue to follow and assist with discharge planning needs.   Expected Discharge Plan: Skilled Nursing Facility Barriers to Discharge: Continued Medical Work up, English as a second language teacher   Patient Goals and CMS Choice   CMS Medicare.gov Compare Post Acute Care list provided to:: Patient Represenative (must comment)(Robert, spouse) Choice offered to / list presented to : Spouse  Expected Discharge Plan and Services Expected Discharge Plan: Skilled Nursing Facility       Living arrangements for the past 2 months: Single Family Home                                      Prior Living Arrangements/Services Living arrangements for the past 2 months: Single Family Home Lives with:: Spouse Patient language and need for interpreter reviewed:: Yes Do you feel safe going back to the place where you live?: Yes      Need for Family Participation in Patient Care: Yes (Comment) Care giver support system in place?: Yes (comment)   Criminal Activity/Legal Involvement Pertinent to Current Situation/Hospitalization: No - Comment as needed  Activities of Daily Living   ADL Screening  (condition at time of admission) Patient's cognitive ability adequate to safely complete daily activities?: No  Permission Sought/Granted Permission sought to share information with : Facility Medical sales representative, Family Supports    Share Information with NAME: Molly Maduro  Permission granted to share info w AGENCY: SNFs  Permission granted to share info w Relationship: Spouse  Permission granted to share info w Contact Information: [phone]  Emotional Assessment   Attitude/Demeanor/Rapport: Unable to Assess Affect (typically observed): Unable to Assess Orientation: : Oriented to Self Alcohol / Substance Use: Not Applicable Psych Involvement: No (comment)  Admission diagnosis:  SOB (shortness of breath) [R06.02] Altered mental status, unspecified altered mental status type [R41.82] AMS (altered mental status) [R41.82] Patient Active Problem List   Diagnosis Date Noted  . Seizure ([facility])   . CVA (cerebral vascular accident) ([facility]) 01/20/2020  . AMS (altered mental status) 01/20/2020  . SOB (shortness of breath)   . Pacemaker 06/30/2019  . Symptomatic bradycardia 04/08/2017  . Paroxysmal atrial fibrillation ([facility]) 02/02/2017  . RBBB 02/27/2014  . PAC (premature atrial contraction) 02/27/2014  . CREST syndrome ([facility]) 02/27/2014   PCP:  Merri Brunette, MD Pharmacy:   [facility] [id number] [place], [place] - [phone] AT [facility] [address] [place] [address] Phone: [phone] Fax: [phone]     Social Determinants of Health (SDOH) Interventions    Readmission Risk Interventions No flowsheet data found.

## 2020-01-24 DIAGNOSIS — I7300 Raynaud's syndrome without gangrene: Secondary | ICD-10-CM

## 2020-01-24 LAB — CBC
HCT: 44.8 % (ref 36.0–46.0)
Hemoglobin: 14.6 g/dL (ref 12.0–15.0)
MCH: 34.5 pg — ABNORMAL HIGH (ref 26.0–34.0)
MCHC: 32.6 g/dL (ref 30.0–36.0)
MCV: 105.9 fL — ABNORMAL HIGH (ref 80.0–100.0)
Platelets: 78 10*3/uL — ABNORMAL LOW (ref 150–400)
RBC: 4.23 MIL/uL (ref 3.87–5.11)
RDW: 15.6 % — ABNORMAL HIGH (ref 11.5–15.5)
WBC: 7 10*3/uL (ref 4.0–10.5)
nRBC: 0 % (ref 0.0–0.2)

## 2020-01-24 MED ORDER — LEVETIRACETAM 250 MG PO TABS
250.0000 mg | ORAL_TABLET | Freq: Two times a day (BID) | ORAL | Status: DC
  Administered 2020-01-24 – 2020-01-25 (×2): 250 mg via ORAL
  Filled 2020-01-24 (×2): qty 1

## 2020-01-24 MED ORDER — HALOPERIDOL LACTATE 5 MG/ML IJ SOLN: 1.0000 mg | Freq: Four times a day (QID) | INTRAMUSCULAR | Status: DC | PRN

## 2020-01-24 NOTE — TOC Progression Note (Addendum)
Transition of Care [facility]) - Progression Note    Patient Details  Name: CAMBREY LUPI MRN: [id number] Date of Birth: May 27, 1929  [facility] CM/SW Contact  Terrilee Croak, Student-Social Work Phone Number: 01/24/2020, 2:37 PM  Clinical Narrative:     MSW Intern, at the request of the dr. Geradine Girt pt's son Loraine Leriche, to discuss discharge planning. SNF options were emailed to him and questions were answered. He advised that he will follow up with decisions and any more questions. SW will continue to follow.   Addended 01/24/20 3:32 pm. Pt's son, Loraine Leriche chose [address] Facility was contacted and they confirmed a bed as soon as Berkley Harvey is acquired. Berkley Harvey is started, Covid test is recent.   Expected Discharge Plan: Skilled Nursing Facility Barriers to Discharge: Continued Medical Work up, English as a second language teacher  Expected Discharge Plan and Services Expected Discharge Plan: Skilled Nursing Facility       Living arrangements for the past 2 months: Single Family Home                                       Social Determinants of Health (SDOH) Interventions    Readmission Risk Interventions No flowsheet data found.

## 2020-01-24 NOTE — Progress Notes (Addendum)
PROGRESS NOTE   Crystal Espinoza  VZD:[id number]    DOB: 18-Dec-1928    DOA: 01/20/2020  PCP: Deland Pretty, MD   I have briefly reviewed patients previous medical records in [facility].  Chief Complaint:   Chief Complaint  Patient presents with  . Code Stroke    Brief Narrative:  86 year old female with PMH of paroxysmal A. fib/atrial flutter on Xarelto, symptomatic bradycardia s/p PPM, severe pulmonary hypertension, hypertension, CREST syndrome with Raynaud's phenomenon (confirmed with PCP 4/12) presented to the ED due to confusion, expressive aphasia and possible right-sided neglect.  Evaluated by Neurology in ED.  Admitted for suspected acute stroke versus seizure.  Stroke work-up negative.  Now working diagnosis is seizures on admission.  Adult failure to thrive, suspect underlying dementia.  Awaiting SNF/TOC on board.   Assessment & Plan:  Active Problems:   CVA (cerebral vascular accident) ([place])   AMS (altered mental status)   Seizure ([place])   New onset seizure, unprovoked: CT head code stroke: No acute abnormality.  Atrophy and extensive chronic microvascular ischemia.  CTA head and neck and CTP: 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,  Probably chronic.  Otherwise no large vessel occlusion or significant stenosis.  Nondiagnostic perfusion imaging.  Unable to perform MRI due to PPM incompatibility.  Stat EEG showed sharp waves in left frontal region as well as slowing in the left hemisphere.  As per Neurology, presentation of aphasia, possible right-sided neglect with normal CT head most likely suggestive of seizures.  No features suggestive of infectious causes as precipitant.  Hypertensive with BP 180/83 on arrival but no evidence of PRES on CT head.  Low index of suspicion for meningitis/encephalitis.  Patient was loaded with Keppra, continued Keppra 500 mg twice daily. She is on Xarelto for  anticoagulation, Atorvastatin 40 mg daily started for hyperlipidemia.  Seizure precautions.  Control blood pressure.  Outpatient follow-up with Neurology in 8 to 12 weeks after discharge.    Due to lethargy, Keppra dose was reduced to 250 twice daily.  Mental status has improved, alert.  Continue current dose.  CT head repeated and negative for acute infarct. As per ST, now on dysphagia 1 diet and thin liquids.  Acute encephalopathy: Secondary to postictal phase and Keppra complicating underlying possible dementia.  Alert and oriented to person and partly to place but appears slightly more confused today.  Did receive a dose of Ativan at midnight last night.  Avoid sedatives, benzodiazepines and opioids.  Atrial flutter: Xarelto had been changed to Lovenox on admission while she was n.p.o, now transitioned back to Xarelto.  Admitting [facility] MD curbside consulted Cardiology who read patient's EKG and compared to her past EKG, and [employer] was contacted to investigate pacemaker, conclusion is atrial flutter is chronic and pacemaker working well (setting as 60 bpm to cocaine).  Back on Xarelto since 4/10.  Essential hypertension: Continue amlodipine 5 mg daily which is also to help with her Raynaud's.  Mildly uncontrolled at times.  Hypothyroidism: Continue prior home dose of Synthroid.  CREST syndrome with Raynaud's phenomenon: ABG: pH 7.436, PCO2 29.7, PO2 92, bicarbonate 21 and oxygen saturation 98%.:  LVEF 45-50% no regional wall motion abnormalities.  Mild LVH.  Patient has intermittent cyanotic changes of her fingertips more than her toe tips.  I discussed in detail with patient's son and spouse at bedside on 4/11 and this reportedly has been ongoing for the last 8  months, PCP aware and evaluated, uses stockings for lower extremities.  Outpatient follow-up with PCP.  I called and discussed with Dr. Renne Crigler, PCP who reviewed patient's records and advised that her findings are due to CREST syndrome and  Raynaud's phenomenon.  Nothing new to do here. Continue Amlodipine for Raynaud's.  Body mass index is 19.46 kg/m.   Thrombocytopenia: Appears chronic and fluctuating at least since 2008.  Unclear regarding drop her platelets from 129 on admission to 78.  Follow CBC closely and consider evaluation if continues to drop further.?  Related to medications  LLL opacity on chest x-ray: No clinical pneumonia.  Afebrile, no leukocytosis.  Monitor off of antibiotics.  Follow chest x-ray in 4 weeks.  Adult failure to thrive: Multifactorial due to advanced age, multiple severe significant comorbidities and acute illness.  Consider palliative care consultation at SNF or as outpatient   DVT prophylaxis: Xarelto Code Status: Full Family Communication: Discussed in detail with patient's son via phone, updated care and answered questions.  Patient spouse is extremely hard of hearing and also appears to have cognitive impairment and unable to comprehend when I called him prior to calling the son Disposition:  . Patient came from: Home           . Anticipated d/c place: SNF . Barriers to d/c: DC to SNF pending bed/insurance.  Recommend palliative care consultation at SNF   Consultants:   Neurology  Procedures:   None  Antimicrobials:   None   Subjective:  Alert and oriented to person and partly to place.  Appears slightly more confused.  Follows simple instructions.  She did poorly with OT today.  Objective:   Vitals:   01/24/20 0006 01/24/20 0403 01/24/20 0855 01/24/20 1201  BP: (!) 166/71 (!) 166/70 (!) 141/77 (!) 153/67  Pulse: 60 63 60 60  Resp: 18 20 20 20   Temp: (!) 97.5 F (36.4 C) (!) 97.4 F (36.3 C) (!) 97.4 F (36.3 C) (!) 97.5 F (36.4 C)  TempSrc: Oral Axillary Oral Oral  SpO2:   94% 100%  Weight:      Height:        General exam: Elderly female, moderately built and frail, lying comfortably propped up in bed.  Oral mucosa moist.   Respiratory system: Clear to  auscultation no increased work of breathing Cardiovascular system: S1 and S2 heard, RRR.  No JVD, murmurs or pedal edema.  Telemetry personally reviewed: A. fib with controlled ventricular rate/V paced rhythm in the 60s Gastrointestinal system: Abdomen is nondistended, soft and nontender. No organomegaly or masses felt. Normal bowel sounds heard. Central nervous system: Mental status as noted above.  Follows simple instructions. No focal neurological deficits. Extremities: Symmetric 5 x 5 power.  Fingers cool with mild tip of finger cyanosis, actually appeared better than last couple of days but this seems to be fluctuating.  Symmetric normal radial pulses.  No significant cyanosis of toes.  Bilateral dorsalis pedis felt. Skin: No rashes, lesions or ulcers Psychiatry: Judgement and insight impaired. Mood & affect flat today.     Data Reviewed:   I have personally reviewed following labs and imaging studies   CBC: Recent Labs  Lab 01/20/20 1558 01/20/20 1607 01/21/20 0341 01/21/20 0341 01/22/20 0531 01/23/20 0332 01/24/20 0408  WBC 4.2   < > 6.0   < > 6.8 5.5 7.0  NEUTROABS 3.1  --  4.3  --   --   --   --   HGB 14.5   < >  13.9   < > 13.4 14.4 14.6  HCT 46.4*   < > 42.8   < > 41.7 45.2 44.8  MCV 107.9*   < > 106.2*   < > 106.6* 106.9* 105.9*  PLT 123*   < > 129*   < > 111* 95* 78*   < > = values in this interval not displayed.    Basic Metabolic Panel: Recent Labs  Lab 01/20/20 1558 01/20/20 1558 01/20/20 1607 01/20/20 1937 01/22/20 0531  NA 138   < > 136 135 139  K 4.3   < > 4.0 4.3 3.9  CL 104  --  103  --  104  CO2 20*  --   --   --  20*  GLUCOSE 111*  --  109*  --  77  BUN 11  --  12  --  9  CREATININE 0.80  --  0.80  --  0.79  CALCIUM 9.1  --   --   --  8.7*   < > = values in this interval not displayed.    Liver Function Tests: Recent Labs  Lab 01/20/20 1558  AST 22  ALT 13  ALKPHOS 104  BILITOT 1.3*  PROT 6.5  ALBUMIN 3.8    CBG: Recent Labs  Lab  01/20/20 1602  GLUCAP 105*    Microbiology Studies:   Recent Results (from the past 240 hour(s))  Respiratory Panel by RT PCR (Flu A&B, Covid) - Nasopharyngeal Swab     Status: None   Collection Time: 01/20/20  4:35 PM   Specimen: Nasopharyngeal Swab  Result Value Ref Range Status   SARS Coronavirus 2 by RT PCR NEGATIVE NEGATIVE Final    Comment: (NOTE) SARS-CoV-2 target nucleic acids are NOT DETECTED. The SARS-CoV-2 RNA is generally detectable in upper respiratoy specimens during the acute phase of infection. The lowest concentration of SARS-CoV-2 viral copies this assay can detect is 131 copies/mL. A negative result does not preclude SARS-Cov-2 infection and should not be used as the sole basis for treatment or other patient management decisions. A negative result may occur with  improper specimen collection/handling, submission of specimen other than nasopharyngeal swab, presence of viral mutation(s) within the areas targeted by this assay, and inadequate number of viral copies (<131 copies/mL). A negative result must be combined with clinical observations, patient history, and epidemiological information. The expected result is Negative. Fact Sheet for Patients:  https://www.moore.com/ Fact Sheet for Healthcare Providers:  https://www.young.biz/ This test is not yet ap proved or cleared by the [place] FDA and  has been authorized for detection and/or diagnosis of SARS-CoV-2 by FDA under an Emergency Use Authorization (EUA). This EUA will remain  in effect (meaning this test can be used) for the duration of the COVID-19 declaration under Section 564(b)(1) of the Act, 21 U.S.C. section 360bbb-3(b)(1), unless the authorization is terminated or revoked sooner.    Influenza A by PCR NEGATIVE NEGATIVE Final   Influenza B by PCR NEGATIVE NEGATIVE Final    Comment: (NOTE) The Xpert Xpress SARS-CoV-2/FLU/RSV assay is intended as an aid  in  the diagnosis of influenza from Nasopharyngeal swab specimens and  should not be used as a sole basis for treatment. Nasal washings and  aspirates are unacceptable for Xpert Xpress SARS-CoV-2/FLU/RSV  testing. Fact Sheet for Patients: https://www.moore.com/ Fact Sheet for Healthcare Providers: https://www.young.biz/ This test is not yet approved or cleared by the [place] FDA and  has been authorized for detection and/or diagnosis of  SARS-CoV-2 by  FDA under an Emergency Use Authorization (EUA). This EUA will remain  in effect (meaning this test can be used) for the duration of the  Covid-19 declaration under Section 564(b)(1) of the Act, 21  U.S.C. section 360bbb-3(b)(1), unless the authorization is  terminated or revoked. Performed at [facility], [address], [place], [place] [address]   SARS CORONAVIRUS 2 (TAT 6-24 HRS) Nasopharyngeal Nasopharyngeal Swab     Status: None   Collection Time: 01/23/20  5:10 PM   Specimen: Nasopharyngeal Swab  Result Value Ref Range Status   SARS Coronavirus 2 NEGATIVE NEGATIVE Final    Comment: (NOTE) SARS-CoV-2 target nucleic acids are NOT DETECTED. The SARS-CoV-2 RNA is generally detectable in upper and lower respiratory specimens during the acute phase of infection. Negative results do not preclude SARS-CoV-2 infection, do not rule out co-infections with other pathogens, and should not be used as the sole basis for treatment or other patient management decisions. Negative results must be combined with clinical observations, patient history, and epidemiological information. The expected result is Negative. Fact Sheet for Patients: HairSlick.no Fact Sheet for Healthcare Providers: quierodirigir.com This test is not yet approved or cleared by the [place] FDA and  has been authorized for detection and/or diagnosis of SARS-CoV-2  by FDA under an Emergency Use Authorization (EUA). This EUA will remain  in effect (meaning this test can be used) for the duration of the COVID-19 declaration under Section 56 4(b)(1) of the Act, 21 U.S.C. section 360bbb-3(b)(1), unless the authorization is terminated or revoked sooner. Performed at [facility], [address] [place], [place] [address]      Radiology Studies:  No results found.   Scheduled Meds:   . amLODipine  5 mg Oral Daily  . atorvastatin  40 mg Oral q1800  . levothyroxine  100 mcg Oral Q0600  . rivaroxaban  15 mg Oral Q supper    Continuous Infusions:   . levETIRAcetam 250 mg (01/24/20 0900)     LOS: 4 days     Marcellus Scott, MD, [place], [facility]. [facility]    To contact the attending provider between 7A-7P or the covering provider during after hours 7P-7A, please log into the web site [email or web address] and access using universal [place] password for that web site. If you do not have the password, please call the hospital operator.  01/24/2020, 2:25 PM

## 2020-01-24 NOTE — Progress Notes (Signed)
Occupational Therapy Treatment Patient Details Name: Crystal Espinoza MRN: [id number] DOB: Apr 06, 1929 Today's Date: 01/24/2020    History of present illness 86 y.o. female presented with altered mental status weakness, aphasia, and inability to follow commands. Pt with diagnosis of seizures, acute postictal encephalopathy.No past PMH of dementia and does not read at baseline PMH cataract, crest syndrome with Raynaud's phenomenon, HTN, AFIB, severe pulmonary HTN, Bradycardia, AMS, RBBB, PAC, pacemaker   OT comments  Pt alert and talking to therapist this session but unable to feed herself without extensive (A). Pt reports pain in bil feet area and noted to have color change. Pt following 1 step commands this session and asking questions with no recall of information. Pt is oriented to DOB this session.    Follow Up Recommendations  SNF    Equipment Recommendations  Wheelchair (measurements OT);Wheelchair cushion (measurements OT);Hospital bed;Other (comment)(hoyer)    Recommendations for Other Services Other (comment)(Palliative Team )    Precautions / Restrictions Precautions Precautions: Fall Precaution Comments: seizure       Mobility Bed Mobility Overal bed mobility: Needs Assistance             General bed mobility comments: initiate and terminated due to monitoring patient. Pt was unable to (A) with any attempt at bed mobility this session and will require maximum (A) for transfers.   Transfers                 General transfer comment: not appropriate at this time    Balance                                           ADL either performed or assessed with clinical judgement   ADL Overall ADL's : Needs assistance/impaired Eating/Feeding: Maximal assistance Eating/Feeding Details (indicate cue type and reason): pt requires cues to initiate self feeding and physical (A) at times. pt easily distracted from task. pt very talkive at the moment  and needs redirection to self feeding.  Grooming: Maximal assistance                                 General ADL Comments: pt allowed time to attempt self feeding and holding cup to drink. pt when initiated bed mobility verbalized pain in feet. socks removed and noted to have color change in feet as well as hands. hands more discolored than start of session. RN called to room to address. Oxygen saturation poor read and second pulse used with 92% RA noted on that finger pulse ox. Rn addressing patient. OT /PT to terminate task as Rn continued to work up patient for these changes. Pt alert and responding to staff.      Vision       Perception     Praxis      Cognition Arousal/Alertness: Awake/alert Behavior During Therapy: Flat affect Overall Cognitive Status: No family/caregiver present to determine baseline cognitive functioning                                 General Comments: pt following 1 step commands inconsistently. pt requires cues to sequence self feeding        Exercises     Shoulder Instructions       General Comments noted  to have skin changes in all extremities. RN , Scientist, water quality and MD all notified of changes noted in session. SW educated via PT to maximize services for home if family wishes to progress toward home.     Pertinent Vitals/ Pain       Pain Assessment: Faces Faces Pain Scale: Hurts even more Pain Location: feet bil Pain Descriptors / Indicators: Grimacing;Discomfort Pain Intervention(s): Repositioned;Monitored during session  Home Living                                          Prior Functioning/Environment              Frequency  Min 2X/week        Progress Toward Goals  OT Goals(current goals can now be found in the care plan section)  Progress towards OT goals: Not progressing toward goals - comment  Acute Rehab OT Goals Patient Stated Goal: none stated Potential to Achieve  Goals: Fair ADL Goals Pt Will Perform Grooming: with supervision;standing Pt Will Perform Upper Body Bathing: with supervision;sitting Pt Will Perform Lower Body Bathing: with min guard assist;sit to/from stand Pt Will Perform Upper Body Dressing: with supervision Pt Will Perform Lower Body Dressing: with min guard assist;sit to/from stand Pt Will Transfer to Toilet: with min guard assist Pt Will Perform Toileting - Clothing Manipulation and hygiene: with min guard assist  Plan Discharge plan remains appropriate    Co-evaluation    PT/OT/SLP Co-Evaluation/Treatment: Yes Reason for Co-Treatment: For patient/therapist safety;To address functional/ADL transfers   OT goals addressed during session: ADL's and self-care;Strengthening/ROM      AM-PAC OT "6 Clicks" Daily Activity     Outcome Measure   Help from another person eating meals?: Total Help from another person taking care of personal grooming?: Total Help from another person toileting, which includes using toliet, bedpan, or urinal?: Total Help from another person bathing (including washing, rinsing, drying)?: Total Help from another person to put on and taking off regular upper body clothing?: Total Help from another person to put on and taking off regular lower body clothing?: Total 6 Click Score: 6    End of Session    OT Visit Diagnosis: Unsteadiness on feet (R26.81);Muscle weakness (generalized) (M62.81)   Activity Tolerance Patient tolerated treatment well   Patient Left in bed;with call bell/phone within reach;with nursing/sitter in room;with bed alarm set   Nurse Communication Mobility status;Precautions        Time: 3875-6433 OT Time Calculation (min): 23 min  Charges: OT General Charges $OT Visit: 1 Visit OT Treatments $Self Care/Home Management : 8-22 mins   Brynn, OTR/L  Acute Rehabilitation Services Pager: [phone] Office: [phone] .    Jeri Modena 01/24/2020, 2:17 PM

## 2020-01-24 NOTE — Discharge Instructions (Signed)
Information on my medicine - XARELTO (Rivaroxaban)  *Xarelto dose was reduced to 15 mg daily based on patient's renal function.   Why was Xarelto prescribed for you? Xarelto was prescribed for you to reduce the risk of a blood clot forming that can cause a stroke if you have a medical condition called atrial fibrillation (a type of irregular heartbeat).  What do you need to know about xarelto ? Take your Xarelto ONCE DAILY at the same time every day with your evening meal. If you have difficulty swallowing the tablet whole, you may crush it and mix in applesauce just prior to taking your dose.  Take Xarelto exactly as prescribed by your doctor and DO NOT stop taking Xarelto without talking to the doctor who prescribed the medication.  Stopping without other stroke prevention medication to take the place of Xarelto may increase your risk of developing a clot that causes a stroke.  Refill your prescription before you run out.  After discharge, you should have regular check-up appointments with your healthcare provider that is prescribing your Xarelto.  In the future your dose may need to be changed if your kidney function or weight changes by a significant amount.  What do you do if you miss a dose? If you are taking Xarelto ONCE DAILY and you miss a dose, take it as soon as you remember on the same day then continue your regularly scheduled once daily regimen the next day. Do not take two doses of Xarelto at the same time or on the same day.   Important Safety Information A possible side effect of Xarelto is bleeding. You should call your healthcare provider right away if you experience any of the following: ? Bleeding from an injury or your nose that does not stop. ? Unusual colored urine (red or dark brown) or unusual colored stools (red or black). ? Unusual bruising for unknown reasons. ? A serious fall or if you hit your head (even if there is no bleeding).  Some medicines may  interact with Xarelto and might increase your risk of bleeding while on Xarelto. To help avoid this, consult your healthcare provider or pharmacist prior to using any new prescription or non-prescription medications, including herbals, vitamins, non-steroidal anti-inflammatory drugs (NSAIDs) and supplements.  This website has more information on Xarelto: VisitDestination.com.br.

## 2020-01-24 NOTE — Progress Notes (Signed)
01/24/20 1520  PT Visit Information  Last PT Received On 01/24/20  Assistance Needed +2  History of Present Illness 86 y.o. female presented with altered mental status weakness, aphasia, and inability to follow commands. Pt with diagnosis of seizures, acute postictal encephalopathy.No past PMH of dementia and does not read at baseline PMH cataract, crest syndrome with Raynaud's phenomenon, HTN, AFIB, severe pulmonary HTN, Bradycardia, AMS, RBBB, PAC, pacemaker  Subjective Data  Patient Stated Goal "to brush my hair"   Precautions  Precautions Fall  Precaution Comments seizure  Restrictions  Weight Bearing Restrictions No  Pain Assessment  Pain Assessment Faces  Faces Pain Scale 6  Pain Location feet bil  Pain Descriptors / Indicators Grimacing;Discomfort  Pain Intervention(s) Monitored during session;Limited activity within patient's tolerance;Repositioned  Cognition  Arousal/Alertness Awake/alert  Behavior During Therapy Flat affect  Overall Cognitive Status No family/caregiver present to determine baseline cognitive functioning  General Comments With slowed processing and following one step commands inconsistently.   Bed Mobility  Overal bed mobility Needs Assistance  Bed Mobility Supine to Sit;Sit to Supine  Supine to sit Mod assist  Sit to supine Max assist  General bed mobility comments Mod to max A for trunk assist and LE assist. Increased time to perform bed mobility tasks. Multimodal cues for sequencing.   Transfers  Overall transfer level Needs assistance  Equipment used 1 person hand held assist  Transfers Sit to/from Stand  Sit to Stand Mod assist  General transfer comment Mod A for lift assist and steadying. Pt with posterior lean and requiring assist to adjust.   Ambulation/Gait  Ambulation/Gait assistance Max assist;Mod assist  Assistive device 1 person hand held assist  General Gait Details PT stood in front of pt and had pt hold to PT arms. Required assist  with weightshifting to take steps at EOB. Mod to max A for steadying A.   Balance  Overall balance assessment Needs assistance  Sitting-balance support Feet supported  Sitting balance-Leahy Scale Fair  Standing balance support Bilateral upper extremity supported;During functional activity  Standing balance-Leahy Scale Poor  Standing balance comment Reliant on UE and external support   PT - End of Session  Equipment Utilized During Treatment Gait belt  Activity Tolerance Patient tolerated treatment well  Patient left in bed;with call bell/phone within reach;with bed alarm set  Nurse Communication Mobility status   PT - Assessment/Plan  PT Plan Current plan remains appropriate  PT Visit Diagnosis Other abnormalities of gait and mobility (R26.89);Muscle weakness (generalized) (M62.81)  PT Frequency (ACUTE ONLY) Min 2X/week  Follow Up Recommendations SNF;Supervision/Assistance - 24 hour  PT equipment Wheelchair (measurements PT);Wheelchair cushion (measurements PT);Other (comment);Hospital bed (hoyer and hoyer lift pad)  AM-PAC PT "6 Clicks" Mobility Outcome Measure (Version 2)  Help needed turning from your back to your side while in a flat bed without using bedrails? 2  Help needed moving from lying on your back to sitting on the side of a flat bed without using bedrails? 2  Help needed moving to and from a bed to a chair (including a wheelchair)? 2  Help needed standing up from a chair using your arms (e.g., wheelchair or bedside chair)? 2  Help needed to walk in hospital room? 1  Help needed climbing 3-5 steps with a railing?  1  6 Click Score 10  Consider Recommendation of Discharge To: CIR/SNF/LTACH  PT Goal Progression  Progress towards PT goals Progressing toward goals  Acute Rehab PT Goals  PT Goal Formulation With  patient  Time For Goal Achievement 02/04/20  Potential to Achieve Goals Good  PT Time Calculation  PT Start Time (ACUTE ONLY) 1432  PT Stop Time (ACUTE ONLY) 1446   PT Time Calculation (min) (ACUTE ONLY) 14 min  PT General Charges  $$ ACUTE PT VISIT 1 Visit  PT Treatments  $Therapeutic Activity 8-22 mins   Seen for second session for re-assessment of mobility after cleared medically. Required mod to max A to stand and take side steps at EOB. Required physical assist for weightshifting to take steps at EOB. Feel pt would benefit from SNF level therapies at this time. However, if pt and family refuses, will require max HH services and DME above. Will continue to follow acutely.   Farley Ly, PT, DPT  Acute Rehabilitation Services  Pager: [phone] Office: [phone]

## 2020-01-24 NOTE — Progress Notes (Signed)
01/24/20 1456  PT Visit Information  Last PT Received On 01/24/20  Assistance Needed +2  PT/OT/SLP Co-Evaluation/Treatment Yes  Reason for Co-Treatment For patient/therapist safety;To address functional/ADL transfers  PT goals addressed during session Mobility/safety with mobility  History of Present Illness 86 y.o. female presented with altered mental status weakness, aphasia, and inability to follow commands. Pt with diagnosis of seizures, acute postictal encephalopathy.No past PMH of dementia and does not read at baseline PMH cataract, crest syndrome with Raynaud's phenomenon, HTN, AFIB, severe pulmonary HTN, Bradycardia, AMS, RBBB, PAC, pacemaker  Subjective Data  Patient Stated Goal none stated  Precautions  Precautions Fall  Precaution Comments seizure  Restrictions  Weight Bearing Restrictions No  Pain Assessment  Pain Assessment Faces  Faces Pain Scale 6  Pain Location feet bil  Pain Descriptors / Indicators Grimacing;Discomfort  Pain Intervention(s) Monitored during session;Limited activity within patient's tolerance;Repositioned  Cognition  Arousal/Alertness Awake/alert  Behavior During Therapy Flat affect  Overall Cognitive Status No family/caregiver present to determine baseline cognitive functioning  General Comments pt following 1 step commands inconsistently. pt requires cues to sequence self feeding  Bed Mobility  Overal bed mobility Needs Assistance  General bed mobility comments initiate and terminated due to monitoring (pt fingers and toes with discoloration bilaterally). patient. Pt was unable to (A) with any attempt at bed mobility this session and will require maximum (A) for transfers. Pt with increased pain in RLE.   Transfers  General transfer comment not appropriate at this time  General Comments  General comments (skin integrity, edema, etc.) noted to have skin changes in all extremities. RN , Engineer, petroleum and MD all notified of changes noted in  session. SW educated via PT to maximize services for home if family wishes to progress toward home.   PT - End of Session  Activity Tolerance Treatment limited secondary to medical complications (Comment) (discoleration in fingers and toes bilaterally )  Patient left in bed;with call bell/phone within reach;with nursing/sitter in room  Nurse Communication Mobility status   PT - Assessment/Plan  PT Plan Equipment recommendations need to be updated;Frequency needs to be updated  PT Visit Diagnosis Other abnormalities of gait and mobility (R26.89);Muscle weakness (generalized) (M62.81)  PT Frequency (ACUTE ONLY) Min 2X/week  Follow Up Recommendations SNF;Supervision/Assistance - 24 hour  PT equipment Wheelchair (measurements PT);Wheelchair cushion (measurements PT);Other (comment);Hospital bed (hoyer and hoyer lift pad)  AM-PAC PT "6 Clicks" Mobility Outcome Measure (Version 2)  Help needed turning from your back to your side while in a flat bed without using bedrails? 2  Help needed moving from lying on your back to sitting on the side of a flat bed without using bedrails? 2  Help needed moving to and from a bed to a chair (including a wheelchair)? 2  Help needed standing up from a chair using your arms (e.g., wheelchair or bedside chair)? 2  Help needed to walk in hospital room? 2  Help needed climbing 3-5 steps with a railing?  1  6 Click Score 11  Consider Recommendation of Discharge To: CIR/SNF/LTACH  PT Goal Progression  Progress towards PT goals Not progressing toward goals - comment (medical change which required monitoring)  Acute Rehab PT Goals  PT Goal Formulation With patient  Time For Goal Achievement 02/04/20  Potential to Achieve Goals Good  PT Time Calculation  PT Start Time (ACUTE ONLY) 1250  PT Stop Time (ACUTE ONLY) 1313  PT Time Calculation (min) (ACUTE ONLY) 23 min  PT General Charges  $$  ACUTE PT VISIT 1 Visit  PT Treatments  $Therapeutic Activity 8-22 mins    Pt requiring assist to feed self this session. Attempted OOB, however, pt reporting increased pain in bilateral feet. Noted color change in bilateral fingers and toes as well, so deferred further mobility until cleared medically. RN present in room at end of session to further assess. Feel SNF is still most appropriate d/c location. If family decides to take pt home, will require max HH services. Will continue to follow acutely to maximize functional mobility independence and safety.   Reuel Derby, PT, DPT  Acute Rehabilitation Services  Pager: [phone] Office: [phone]

## 2020-01-25 DIAGNOSIS — G459 Transient cerebral ischemic attack, unspecified: Secondary | ICD-10-CM | POA: Diagnosis not present

## 2020-01-25 DIAGNOSIS — I4892 Unspecified atrial flutter: Secondary | ICD-10-CM

## 2020-01-25 DIAGNOSIS — E039 Hypothyroidism, unspecified: Secondary | ICD-10-CM | POA: Diagnosis not present

## 2020-01-25 DIAGNOSIS — R1311 Dysphagia, oral phase: Secondary | ICD-10-CM | POA: Diagnosis not present

## 2020-01-25 DIAGNOSIS — D649 Anemia, unspecified: Secondary | ICD-10-CM | POA: Diagnosis not present

## 2020-01-25 DIAGNOSIS — Z743 Need for continuous supervision: Secondary | ICD-10-CM | POA: Diagnosis not present

## 2020-01-25 DIAGNOSIS — M6281 Muscle weakness (generalized): Secondary | ICD-10-CM | POA: Diagnosis not present

## 2020-01-25 DIAGNOSIS — Z79899 Other long term (current) drug therapy: Secondary | ICD-10-CM | POA: Diagnosis not present

## 2020-01-25 DIAGNOSIS — E119 Type 2 diabetes mellitus without complications: Secondary | ICD-10-CM | POA: Diagnosis not present

## 2020-01-25 DIAGNOSIS — I469 Cardiac arrest, cause unspecified: Secondary | ICD-10-CM | POA: Diagnosis not present

## 2020-01-25 DIAGNOSIS — R1310 Dysphagia, unspecified: Secondary | ICD-10-CM | POA: Diagnosis not present

## 2020-01-25 DIAGNOSIS — E559 Vitamin D deficiency, unspecified: Secondary | ICD-10-CM | POA: Diagnosis not present

## 2020-01-25 DIAGNOSIS — E785 Hyperlipidemia, unspecified: Secondary | ICD-10-CM | POA: Diagnosis not present

## 2020-01-25 DIAGNOSIS — R627 Adult failure to thrive: Secondary | ICD-10-CM | POA: Diagnosis not present

## 2020-01-25 DIAGNOSIS — Z741 Need for assistance with personal care: Secondary | ICD-10-CM | POA: Diagnosis not present

## 2020-01-25 DIAGNOSIS — R2681 Unsteadiness on feet: Secondary | ICD-10-CM | POA: Diagnosis not present

## 2020-01-25 DIAGNOSIS — Z1159 Encounter for screening for other viral diseases: Secondary | ICD-10-CM | POA: Diagnosis not present

## 2020-01-25 DIAGNOSIS — Z7401 Bed confinement status: Secondary | ICD-10-CM | POA: Diagnosis not present

## 2020-01-25 DIAGNOSIS — I272 Pulmonary hypertension, unspecified: Secondary | ICD-10-CM | POA: Diagnosis not present

## 2020-01-25 DIAGNOSIS — R400 Somnolence: Secondary | ICD-10-CM | POA: Diagnosis not present

## 2020-01-25 DIAGNOSIS — I10 Essential (primary) hypertension: Secondary | ICD-10-CM | POA: Diagnosis not present

## 2020-01-25 DIAGNOSIS — M2550 Pain in unspecified joint: Secondary | ICD-10-CM | POA: Diagnosis not present

## 2020-01-25 DIAGNOSIS — R569 Unspecified convulsions: Secondary | ICD-10-CM | POA: Diagnosis not present

## 2020-01-25 DIAGNOSIS — I639 Cerebral infarction, unspecified: Secondary | ICD-10-CM | POA: Diagnosis not present

## 2020-01-25 DIAGNOSIS — R2689 Other abnormalities of gait and mobility: Secondary | ICD-10-CM | POA: Diagnosis not present

## 2020-01-25 LAB — CBC
HCT: 45.6 % (ref 36.0–46.0)
Hemoglobin: 15 g/dL (ref 12.0–15.0)
MCH: 34.3 pg — ABNORMAL HIGH (ref 26.0–34.0)
MCHC: 32.9 g/dL (ref 30.0–36.0)
MCV: 104.3 fL — ABNORMAL HIGH (ref 80.0–100.0)
Platelets: 112 10*3/uL — ABNORMAL LOW (ref 150–400)
RBC: 4.37 MIL/uL (ref 3.87–5.11)
RDW: 15.4 % (ref 11.5–15.5)
WBC: 6.7 10*3/uL (ref 4.0–10.5)
nRBC: 0 % (ref 0.0–0.2)

## 2020-01-25 LAB — TSH: TSH: 12.397 u[IU]/mL — ABNORMAL HIGH (ref 0.350–4.500)

## 2020-01-25 LAB — VITAMIN B12: Vitamin B-12: 1174 pg/mL — ABNORMAL HIGH (ref 180–914)

## 2020-01-25 LAB — T4, FREE: Free T4: 1.25 ng/dL — ABNORMAL HIGH (ref 0.61–1.12)

## 2020-01-25 MED ORDER — RIVAROXABAN 15 MG PO TABS: 15.0000 mg | ORAL_TABLET | Freq: Every day | ORAL | Status: DC

## 2020-01-25 MED ORDER — LEVETIRACETAM 250 MG PO TABS: 250.0000 mg | ORAL_TABLET | Freq: Two times a day (BID) | ORAL | Status: DC

## 2020-01-25 MED ORDER — ATORVASTATIN CALCIUM 40 MG PO TABS: 40.0000 mg | ORAL_TABLET | Freq: Every day | ORAL | Status: DC

## 2020-01-25 NOTE — Progress Notes (Signed)
IV removed without complication. Report called to RN Danita at [facility]. Family took patient belongings of clothing home with them. Awaiting [facility] for transport.

## 2020-01-25 NOTE — Discharge Summary (Addendum)
Physician Discharge Summary  Crystal Espinoza XFG:[id number] DOB: 1929/04/17 DOA: 01/20/2020  PCP: Merri Brunette, MD  Admit date: 01/20/2020 Discharge date: 01/25/2020  Admitted From: Home Disposition: SNF  Recommendations for Outpatient Follow-up:  1. Follow up with PCP in 1-2 weeks 2. Please obtain BMP/CBC in one week your next doctors visit.  3. Keppra 250 mg twice daily 4. Lipitor 40 mg daily. 5. Xarelto dose changed to 15 mg daily due to her creatinine clearance per pharmacy recommendations.   Discharge Condition: Stable CODE STATUS: Full code Diet recommendation: Dysphagia 1 diet  Brief/Interim Summary: 86 year old female with PMH of paroxysmal A. fib/atrial flutter on Xarelto, symptomatic bradycardia s/p PPM, severe pulmonary hypertension, hypertension, CREST syndrome with Raynaud's phenomenon (confirmed with PCP 4/12) presented to the ED due to confusion, expressive aphasia and possible right-sided neglect.  Evaluated by Neurology in ED.  Admitted for suspected acute stroke versus seizure.  Stroke work-up negative.  Now working diagnosis is seizures on admission.  Adult failure to thrive, suspect underlying dementia.  Physical therapy team recommended SNF therefore arrangements made.   Seizures, new onset.  Unprovoked -CT head is negative for acute pathology.  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  EEG showed sharp waves in the left frontal region.  Seen by neurology recommending Keppra 500 mg twice daily which was later reduced to 250 mg twice daily due to drowsiness. -Speech team recommended dysphagia 1 diet -Continue Xarelto.  Mild delirium -Overnight had episode of delirium requiring Haldol.  This morning she is awake alert oriented, drowsy this because she did not rest well overnight  Atrial flutter -Currently rate is controlled.  Has pacemaker in place.  Continue Xarelto.  Essential hypertension -Resume  home meds  Hypothyroidism -Continue Synthroid, TSH elevated.  Checking free T4.    Crest syndrome with Raynaud's  -Previous provider discussed with patient's outpatient PCP.  Continue supportive care.  Adult failure to thrive -Encourage oral diet.   Discharge Diagnoses:  Active Problems:   CVA (cerebral vascular accident) (HCC)   AMS (altered mental status)   Seizure (HCC)    Subjective: Somewhat tired this morning.  Overnight had episode of slight delirium requiring Haldol.  This morning otherwise she is awake alert oriented and answering all the basic questions without any issues.  Discharge Exam: Vitals:   01/25/20 0315 01/25/20 0818  BP: (!) 151/73 (!) 149/78  Pulse: 63 60  Resp: 18 18  Temp: 98 F (36.7 C) 98.6 F (37 C)  SpO2: 98% 91%   Vitals:   01/20/20 1640 01/25/20 0018 01/25/20 0315 01/25/20 0818  BP:  (!) 155/79 (!) 151/73 (!) 149/78  Pulse:  60 63 60  Resp:  18 18 18   Temp:  97.8 F (36.6 C) 98 F (36.7 C) 98.6 F (37 C)  TempSrc:  Oral Oral Oral  SpO2:  96% 98% 91%  Weight: 54.7 kg     Height: 5\' 6"  (1.676 m)       General: Overall appears chronically ill and frail.  Alert awake oriented X2. Cardiovascular: RRR, S1/S2 +, no rubs, no gallops Respiratory: CTA bilaterally, no wheezing, no rhonchi Abdominal: Soft, NT, ND, bowel sounds + Extremities: no edema, no cyanosis  Discharge Instructions   Allergies as of 01/25/2020      Reactions   Levaquin [levofloxacin] Other (See Comments)   unknown      Medication List    TAKE these medications  amLODipine 5 MG tablet Commonly known as: NORVASC Take 1 tablet (5 mg total) by mouth daily. *NEEDS MD VISIT FOR FURTHER REFILLS-PLEASE CALL TO SCHEDULE.*   atorvastatin 40 MG tablet Commonly known as: LIPITOR Take 1 tablet (40 mg total) by mouth daily at 6 PM.   calcium citrate-vitamin D 315-200 MG-UNIT tablet Commonly known as: CITRACAL+D Take 1 tablet by mouth 2 (two) times daily.   JOINT  HEALTH PO Take 2,500 mg by mouth daily.   levETIRAcetam 250 MG tablet Commonly known as: KEPPRA Take 1 tablet (250 mg total) by mouth 2 (two) times daily.   multivitamin tablet Take 1 tablet by mouth daily.   Rivaroxaban 15 MG Tabs tablet Commonly known as: XARELTO Take 1 tablet (15 mg total) by mouth daily with supper. What changed:   medication strength  See the new instructions.   Synthroid 100 MCG tablet Generic drug: levothyroxine Take 100 mcg by mouth daily.       Contact information for follow-up providers    Merri Brunette, MD. Schedule an appointment as soon as possible for a visit in 2 week(s).   Specialty: Internal Medicine Contact information: [address] [place] [address] [place] [address] [phone]        [facility]. Schedule an appointment as soon as possible for a visit in 5 week(s).   Specialty: Neurology Contact information: [address] [place] [address] [phone]           Contact information for after-discharge care    Destination    [facility] .   Service: Skilled Nursing Contact information: [address] [place] [address] [phone]                 Allergies  Allergen Reactions  . Levaquin [Levofloxacin] Other (See Comments)    unknown    You were cared for by a hospitalist during your hospital stay. If you have any questions about your discharge medications or the care you received while you were in the hospital after you are discharged, you can call the unit and asked to speak with the hospitalist on call if the hospitalist that took care of you is not available. Once you are discharged, your primary care physician will handle any further medical issues. Please note that no refills for any discharge medications will be authorized once you are discharged, as it is imperative that you return to your primary care physician  (or establish a relationship with a primary care physician if you do not have one) for your aftercare needs so that they can reassess your need for medications and monitor your lab values.   Procedures/Studies: EEG  Result Date: 01/20/2020 Charlsie Quest, MD     01/21/2020  9:44 AM Patient Name: Crystal Espinoza MRN: [id number] Epilepsy Attending: Charlsie Quest Referring Physician/Provider: Dr Georgiana Spinner Aroor Date: 01/20/2020 Duration: 23.02 mins Patient history: 86yo F presented with AMS. On exam noted to have aphasia and possible right-sided neglect. EEG to evaluate for seizure. Level of alertness: lethargic AEDs during EEG study:  Ativan Technical aspects: This EEG study was done with scalp electrodes positioned according to the 10-20 International system of electrode placement. Electrical activity was acquired at a sampling rate of 500Hz  and reviewed with a high frequency filter of 70Hz  and a low frequency filter of 1Hz . EEG data were recorded continuously and digitally stored. DESCRIPTION: EEG showed sharp waves in left frontal region, maximal F3/F7. Continuous  generalized low amplitude 2-3Hz  rhythmic delta slowing in left hemisphere was also noted. Additionally, 8-10Hz  polymorphic alpha activity was seen in right hemisphere. There is also an excessive amount of 15 to 18 Hz, 2-3 uV beta activity with irregular morphology distributed symmetrically and diffusely. Hyperventilation and photic stimulation were not performed. Abnormality -Sharp waves, left frontal region, maximal F3/F7 -Continuous slow, generalized and lateralized left hemisphere -Excessive beta, general IMPRESSION: This study showed evidence of epileptogenicity in left frontal region as well as cortical dysfunction in left hemisphere likely secondary to underlying structural abnormality, postictal state.  Additionally there is evidence of severe diffuse encephalopathy, nonspecific etiology. The excessive beta activity seen in the background is  most likely due to the effect of benzodiazepine and is a benign EEG pattern.No seizures were seen throughout the recording. Priyanka Annabelle Harman   CT ANGIO HEAD W OR WO CONTRAST  Result Date: 01/20/2020 CLINICAL DATA:  Code stroke follow-up EXAM: CT ANGIOGRAPHY HEAD AND NECK CT PERFUSION BRAIN TECHNIQUE: Multidetector CT imaging of the head and neck was performed using the standard protocol during bolus administration of intravenous contrast. Multiplanar CT image reconstructions and MIPs were obtained to evaluate the vascular anatomy. Carotid stenosis measurements (when applicable) are obtained utilizing NASCET criteria, using the distal internal carotid diameter as the denominator. Multiphase CT imaging of the brain was performed following IV bolus contrast injection. Subsequent parametric perfusion maps were calculated using RAPID software. CONTRAST:  OMNIPAQUE IOHEXOL 350 MG/ML SOLN COMPARISON:  None. FINDINGS: CTA NECK FINDINGS Aortic arch: Mild to moderate calcified plaque along the arch. Great vessel origins are patent. Right carotid system: Common and external carotid arteries are patent. Calcified plaque at the bifurcation. There is noncalcified plaque with occlusion of the cervical ICA just beyond the origin. No reconstitution in the neck. Left carotid system: Patent. Mild calcified plaque at the bifurcation without measurable stenosis. Vertebral arteries: Patent.  Codominant. Skeleton: Degenerative changes of the cervical spine. Other neck: No mass or adenopathy. Upper chest: No apical lung mass. Review of the MIP images confirms the above findings CTA HEAD FINDINGS Anterior circulation: Intracranial right internal carotid is occluded with reconstitution at the level of the communicating segment. Left intracranial internal carotid arteries patent with calcified plaque but no significant stenosis. Anterior and middle cerebral arteries are patent. Posterior circulation: Intracranial vertebral arteries  are patent. There is calcified plaque causing moderate stenosis on the left. Basilar artery is patent. Posterior cerebral arteries are patent. Bilateral posterior communicating arteries are present. Venous sinuses: Not well evaluated Review of the MIP images confirms the above findings CT Brain Perfusion Findings: Nondiagnostic due to motion artifact. 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 This is age-indeterminate but probably chronic. Otherwise, no large vessel occlusion or significant stenosis. Caliber of more distal right MCA branches is reduced relative to the left. Nondiagnostic perfusion imaging. Electronically Signed   By: Guadlupe Spanish M.D.   On: 01/20/2020 16:55   CT HEAD WO CONTRAST  Result Date: 01/22/2020 CLINICAL DATA:  86 year old female code stroke presentation two days ago with age indeterminate cervical right ICA occlusion. EXAM: CT HEAD WITHOUT CONTRAST TECHNIQUE: Contiguous axial images were obtained from the base of the skull through the vertex without intravenous contrast. COMPARISON:  CT head, CTA and CTP 4 /8/21. FINDINGS: Brain: Advanced hypodensity in the bilateral cerebral white matter and deep gray nuclei appears stable. Arachnoid granulation versus small area of chronic encephalomalacia at the right lateral  6 cerebellum 6 is stable. No acute intracranial hemorrhage identified. No midline shift, mass effect, or evidence of intracranial mass lesion. No ventriculomegaly. No cortically based acute infarct identified. No suspicious intracranial vascular hyperdensity. Vascular: Extensive Calcified atherosclerosis at the skull base. Skull: No acute osseous abnormality identified. Sinuses/Orbits: Visualized paranasal sinuses and mastoids are stable and well pneumatized. Other: No acute orbit or scalp soft tissue finding. IMPRESSION: 1. Stable non contrast CT appearance of the brain. No acute  or evolving infarct identified. 2. Advanced chronic small vessel disease. Electronically Signed   By: Odessa Fleming M.D.   On: 01/22/2020 19:21   CT ANGIO NECK W OR WO CONTRAST  Result Date: 01/20/2020 CLINICAL DATA:  Code stroke follow-up EXAM: CT ANGIOGRAPHY HEAD AND NECK CT PERFUSION BRAIN TECHNIQUE: Multidetector CT imaging of the head and neck was performed using the standard protocol during bolus administration of intravenous contrast. Multiplanar CT image reconstructions and MIPs were obtained to evaluate the vascular anatomy. Carotid stenosis measurements (when applicable) are obtained utilizing NASCET criteria, using the distal internal carotid diameter as the denominator. Multiphase CT imaging of the brain was performed following IV bolus contrast injection. Subsequent parametric perfusion maps were calculated using RAPID software. CONTRAST:  OMNIPAQUE IOHEXOL 350 MG/ML SOLN COMPARISON:  None. FINDINGS: CTA NECK FINDINGS Aortic arch: Mild to moderate calcified plaque along the arch. Great vessel origins are patent. Right carotid system: Common and external carotid arteries are patent. Calcified plaque at the bifurcation. There is noncalcified plaque with occlusion of the cervical ICA just beyond the origin. No reconstitution in the neck. Left carotid system: Patent. Mild calcified plaque at the bifurcation without measurable stenosis. Vertebral arteries: Patent.  Codominant. Skeleton: Degenerative changes of the cervical spine. Other neck: No mass or adenopathy. Upper chest: No apical lung mass. Review of the MIP images confirms the above findings CTA HEAD FINDINGS Anterior circulation: Intracranial right internal carotid is occluded with reconstitution at the level of the communicating segment. Left intracranial internal carotid arteries patent with calcified plaque but no significant stenosis. Anterior and middle cerebral arteries are patent. Posterior circulation: Intracranial vertebral arteries are  patent. There is calcified plaque causing moderate stenosis on the left. Basilar artery is patent. Posterior cerebral arteries are patent. Bilateral posterior communicating arteries are present. Venous sinuses: Not well evaluated Review of the MIP images confirms the above findings CT Brain Perfusion Findings: Nondiagnostic due to motion artifact. 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 This is age-indeterminate but probably chronic. Otherwise, no large vessel occlusion or significant stenosis. Caliber of more distal right MCA branches is reduced relative to the left. Nondiagnostic perfusion imaging. Electronically Signed   By: Guadlupe Spanish M.D.   On: 01/20/2020 16:55   CT CEREBRAL PERFUSION W CONTRAST  Result Date: 01/20/2020 CLINICAL DATA:  Code stroke follow-up EXAM: CT ANGIOGRAPHY HEAD AND NECK CT PERFUSION BRAIN TECHNIQUE: Multidetector CT imaging of the head and neck was performed using the standard protocol during bolus administration of intravenous contrast. Multiplanar CT image reconstructions and MIPs were obtained to evaluate the vascular anatomy. Carotid stenosis measurements (when applicable) are obtained utilizing NASCET criteria, using the distal internal carotid diameter as the denominator. Multiphase CT imaging of the brain was performed following IV bolus contrast injection. Subsequent parametric perfusion maps were calculated using RAPID software. CONTRAST:  OMNIPAQUE IOHEXOL 350 MG/ML SOLN COMPARISON:  None. FINDINGS: CTA NECK FINDINGS Aortic arch: Mild to moderate calcified plaque along  the arch. Great vessel origins are patent. Right carotid system: Common and external carotid arteries are patent. Calcified plaque at the bifurcation. There is noncalcified plaque with occlusion of the cervical ICA just beyond the origin. No reconstitution in the neck. Left carotid system: Patent. Mild  calcified plaque at the bifurcation without measurable stenosis. Vertebral arteries: Patent.  Codominant. Skeleton: Degenerative changes of the cervical spine. Other neck: No mass or adenopathy. Upper chest: No apical lung mass. Review of the MIP images confirms the above findings CTA HEAD FINDINGS Anterior circulation: Intracranial right internal carotid is occluded with reconstitution at the level of the communicating segment. Left intracranial internal carotid arteries patent with calcified plaque but no significant stenosis. Anterior and middle cerebral arteries are patent. Posterior circulation: Intracranial vertebral arteries are patent. There is calcified plaque causing moderate stenosis on the left. Basilar artery is patent. Posterior cerebral arteries are patent. Bilateral posterior communicating arteries are present. Venous sinuses: Not well evaluated Review of the MIP images confirms the above findings CT Brain Perfusion Findings: Nondiagnostic due to motion artifact. 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 This is age-indeterminate but probably chronic. Otherwise, no large vessel occlusion or significant stenosis. Caliber of more distal right MCA branches is reduced relative to the left. Nondiagnostic perfusion imaging. Electronically Signed   By: Guadlupe Spanish M.D.   On: 01/20/2020 16:55   DG CHEST PORT 1 VIEW  Result Date: 01/20/2020 CLINICAL DATA:  Shortness of breath EXAM: PORTABLE CHEST 1 VIEW COMPARISON:  04/09/2017 FINDINGS: Cardiomegaly. Left pacer remains in place, unchanged. Left lower lobe retrocardiac opacity, question pneumonia. No confluent opacity on the right. No effusions or acute bony abnormality. IMPRESSION: Cardiomegaly. Left lower lobe/retrocardiac opacity concerning for pneumonia. Electronically Signed   By: Charlett Nose M.D.   On: 01/20/2020 19:42   Overnight EEG with  video  Result Date: 01/21/2020 Charlsie Quest, MD     01/21/2020  1:17 PM Patient Name: Crystal Espinoza MRN: [id number] Epilepsy Attending: Charlsie Quest Referring Physician/Provider: Dr Georgiana Spinner Aroor Duration: 4/8/201 2203 to 01/21/2020 0947  Patient history: 86yo F presented with AMS. On exam noted to have aphasia and possible right-sided neglect. EEG to evaluate for seizure.  Level of alertness: lethargic  AEDs during EEG study:  Ativan  Technical aspects: This EEG study was done with scalp electrodes positioned according to the 10-20 International system of electrode placement. Electrical activity was acquired at a sampling rate of 500Hz  and reviewed with a high frequency filter of 70Hz  and a low frequency filter of 1Hz . EEG data were recorded continuously and digitally stored.  DESCRIPTION: EEG showed sharp waves in left frontal region, maximal F3/F7. Continuous generalized and lateralized left hemisphere 3-6hz  theta-delta slowing was noted. Hyperventilation and photic stimulation were not performed.  Abnormality - Sharp waves, left frontal region, maximal F3/F7 - Continuous slow, generalized and lateralized left hemisphere IMPRESSION: This study showed evidence of epileptogenicity in left frontal region as well as cortical dysfunction in left hemisphere likely secondary to underlying structural abnormality, postictal state. Additionally there is evidence of moderate diffuse encephalopathy, nonspecific etiology. No seizures were seen throughout the recording. EEG appears to be improving compared to previous study.   ECHOCARDIOGRAM COMPLETE  Result Date: 01/21/2020    ECHOCARDIOGRAM REPORT   Patient Name:   Crystal Espinoza Date of Exam: 01/21/2020 Medical Rec #:  [id number]         Height:  66.0 in Accession #:    [id number]        Weight:       120.6 lb Date of Birth:  August 26, 1929          BSA:          1.613 m Patient Age:    11 years          BP:           142/65 mmHg Patient Gender: F                  HR:           61 bpm. Exam Location:  Inpatient Procedure: 2D Echo, Color Doppler and Cardiac Doppler Indications:    Stroke i163.9  History:        Patient has prior history of Echocardiogram examinations, most                 recent 07/17/2018. Pacemaker, Pulmonary HTN, Arrythmias:RBBB and                 Atrial Fibrillation; Risk Factors:Hypertension.  Sonographer:    Raquel Sarna Senior RDCS Referring Phys: [id number] [place]  1. Left ventricular ejection fraction, by estimation, is 45 to 50%. The left ventricle has mildly decreased function. The left ventricle has no regional wall motion abnormalities. There is mild concentric left ventricular hypertrophy. Left ventricular diastolic function could not be evaluated.  2. Right ventricular systolic function is normal. The right ventricular size is normal. There is normal pulmonary artery systolic pressure. The estimated right ventricular systolic pressure is 37.3 mmHg.  3. Left atrial size was severely dilated.  4. Right atrial size was severely dilated.  5. The mitral valve is normal in structure. Trivial mitral valve regurgitation.  6. The aortic valve is tricuspid. Aortic valve regurgitation is not visualized. Mild aortic valve sclerosis is present, with no evidence of aortic valve stenosis.  7. The inferior vena cava is dilated in size with >50% respiratory variability, suggesting right atrial pressure of 8 mmHg. Comparison(s): Prior images reviewed side by side. The left ventricular function is worsened. This appears to be largely due to RV apical pacing-induced dyssynchrony (the previous study showed motly ventricular sensed rhythm). The estimated systolic PA pressure has improved. FINDINGS  Left Ventricle: Left ventricular ejection fraction, by estimation, is 45 to 50%. The left ventricle has mildly decreased function. The left ventricle has no regional wall motion abnormalities. The left ventricular internal cavity size was normal in size.  There is mild concentric left ventricular hypertrophy. Abnormal (paradoxical) septal motion, consistent with RV pacemaker. Left ventricular diastolic function could not be evaluated due to atrial fibrillation. Left ventricular diastolic function could not be evaluated. Right Ventricle: The right ventricular size is normal. No increase in right ventricular wall thickness. Right ventricular systolic function is normal. There is normal pulmonary artery systolic pressure. The tricuspid regurgitant velocity is 2.27 m/s, and  with an assumed right atrial pressure of 8 mmHg, the estimated right ventricular systolic pressure is 42.8 mmHg. Left Atrium: Left atrial size was severely dilated. Right Atrium: Right atrial size was severely dilated. Pericardium: There is no evidence of pericardial effusion. Mitral Valve: The mitral valve is normal in structure. Trivial mitral valve regurgitation. Tricuspid Valve: The tricuspid valve is normal in structure. Tricuspid valve regurgitation is trivial. Aortic Valve: The aortic valve is tricuspid. Aortic valve regurgitation is not visualized. Mild aortic valve sclerosis is present, with no evidence of aortic valve  stenosis. Pulmonic Valve: The pulmonic valve was grossly normal. Pulmonic valve regurgitation is mild. Aorta: The aortic root and ascending aorta are structurally normal, with no evidence of dilitation. Venous: The inferior vena cava is dilated in size with greater than 50% respiratory variability, suggesting right atrial pressure of 8 mmHg. IAS/Shunts: No atrial level shunt detected by color flow Doppler.  LEFT VENTRICLE PLAX 2D LVIDd:         3.50 cm     Diastology LVIDs:         2.30 cm     LV e' lateral:   8.27 cm/s LV PW:         1.30 cm     LV E/e' lateral: 6.2 LV IVS:        1.20 cm     LV e' medial:    4.90 cm/s LVOT diam:     1.90 cm     LV E/e' medial:  10.5 LV SV:         35 LV SV Index:   22 LVOT Area:     2.84 cm  LV Volumes (MOD) LV vol d, MOD A2C: 48.9 ml LV  vol d, MOD A4C: 63.7 ml LV vol s, MOD A2C: 35.3 ml LV vol s, MOD A4C: 36.5 ml LV SV MOD A2C:     13.6 ml LV SV MOD A4C:     63.7 ml LV SV MOD BP:      22.4 ml RIGHT VENTRICLE RV S prime:     9.36 cm/s TAPSE (M-mode): 1.1 cm LEFT ATRIUM             Index       RIGHT ATRIUM           Index LA diam:        3.90 cm 2.42 cm/m  RA Area:     24.00 cm LA Vol (A2C):   90.2 ml 55.92 ml/m RA Volume:   70.70 ml  43.83 ml/m LA Vol (A4C):   82.4 ml 51.09 ml/m LA Biplane Vol: 86.4 ml 53.57 ml/m  AORTIC VALVE LVOT Vmax:   61.40 cm/s LVOT Vmean:  42.000 cm/s LVOT VTI:    0.125 m  AORTA Ao Root diam: 3.00 cm Ao Asc diam:  3.00 cm MITRAL VALVE               TRICUSPID VALVE MV Area (PHT): 3.31 cm    TR Peak grad:   20.6 mmHg MV Decel Time: 229 msec    TR Vmax:        227.00 cm/s MV E velocity: 51.40 cm/s MV A velocity: 28.70 cm/s  SHUNTS MV E/A ratio:  1.79        Systemic VTI:  0.12 m                            Systemic Diam: 1.90 cm Rachelle Hora Croitoru MD Electronically signed by Thurmon Fair MD Signature Date/Time: 01/21/2020/4:13:30 PM    Final    CUP PACEART REMOTE DEVICE CHECK  Result Date: 01/17/2020 Scheduled remote reviewed. Normal device function.  Next remote 91 days. Salomon Fick, RN, MSN  CT HEAD CODE STROKE WO CONTRAST  Result Date: 01/20/2020 CLINICAL DATA:  Code stroke.  Speech difficulty.  Aphasia. EXAM: CT HEAD WITHOUT CONTRAST TECHNIQUE: Contiguous axial images were obtained from the base of the skull through the vertex without intravenous contrast. COMPARISON:  None. FINDINGS: Brain: Moderate atrophy. Extensive  chronic microvascular ischemic changes are present in the white matter and thalamus bilaterally. Negative for acute infarct, hemorrhage, mass. Vascular: Negative for hyperdense vessel Skull: Negative Sinuses/Orbits: Paranasal sinuses clear. Bilateral cataract extraction. Other: None ASPECTS (Alberta Stroke Program Early CT Score) - Ganglionic level infarction (caudate, lentiform nuclei, internal capsule,  insula, M1-M3 cortex): 7 - Supraganglionic infarction (M4-M6 cortex): 3 Total score (0-10 with 10 being normal): 10 IMPRESSION: 1. No acute abnormality. 2. Atrophy and extensive chronic microvascular ischemia 3. ASPECTS is 10 4. These results were called by telephone at the time of interpretation on 01/20/2020 at 4:22 pm to provider Aroor , who verbally acknowledged these results. Electronically Signed   By: Marlan Palau M.D.   On: 01/20/2020 16:23     The results of significant diagnostics from this hospitalization (including imaging, microbiology, ancillary and laboratory) are listed below for reference.     Microbiology: Recent Results (from the past 240 hour(s))  Respiratory Panel by RT PCR (Flu A&B, Covid) - Nasopharyngeal Swab     Status: None   Collection Time: 01/20/20  4:35 PM   Specimen: Nasopharyngeal Swab  Result Value Ref Range Status   SARS Coronavirus 2 by RT PCR NEGATIVE NEGATIVE Final    Comment: (NOTE) SARS-CoV-2 target nucleic acids are NOT DETECTED. The SARS-CoV-2 RNA is generally detectable in upper respiratoy specimens during the acute phase of infection. The lowest concentration of SARS-CoV-2 viral copies this assay can detect is 131 copies/mL. A negative result does not preclude SARS-Cov-2 infection and should not be used as the sole basis for treatment or other patient management decisions. A negative result may occur with  improper specimen collection/handling, submission of specimen other than nasopharyngeal swab, presence of viral mutation(s) within the areas targeted by this assay, and inadequate number of viral copies (<131 copies/mL). A negative result must be combined with clinical observations, patient history, and epidemiological information. The expected result is Negative. Fact Sheet for Patients:  https://www.moore.com/ Fact Sheet for Healthcare Providers:  https://www.young.biz/ This test is not yet ap proved  or cleared by the [place] FDA and  has been authorized for detection and/or diagnosis of SARS-CoV-2 by FDA under an Emergency Use Authorization (EUA). This EUA will remain  in effect (meaning this test can be used) for the duration of the COVID-19 declaration under Section 564(b)(1) of the Act, 21 U.S.C. section 360bbb-3(b)(1), unless the authorization is terminated or revoked sooner.    Influenza A by PCR NEGATIVE NEGATIVE Final   Influenza B by PCR NEGATIVE NEGATIVE Final    Comment: (NOTE) The Xpert Xpress SARS-CoV-2/FLU/RSV assay is intended as an aid in  the diagnosis of influenza from Nasopharyngeal swab specimens and  should not be used as a sole basis for treatment. Nasal washings and  aspirates are unacceptable for Xpert Xpress SARS-CoV-2/FLU/RSV  testing. Fact Sheet for Patients: https://www.moore.com/ Fact Sheet for Healthcare Providers: https://www.young.biz/ This test is not yet approved or cleared by the [place] FDA and  has been authorized for detection and/or diagnosis of SARS-CoV-2 by  FDA under an Emergency Use Authorization (EUA). This EUA will remain  in effect (meaning this test can be used) for the duration of the  Covid-19 declaration under Section 564(b)(1) of the Act, 21  U.S.C. section 360bbb-3(b)(1), unless the authorization is  terminated or revoked. Performed at [facility], [address], [place], [place] [address]   SARS CORONAVIRUS 2 (TAT 6-24 HRS) Nasopharyngeal Nasopharyngeal Swab     Status: None  Collection Time: 01/23/20  5:10 PM   Specimen: Nasopharyngeal Swab  Result Value Ref Range Status   SARS Coronavirus 2 NEGATIVE NEGATIVE Final    Comment: (NOTE) SARS-CoV-2 target nucleic acids are NOT DETECTED. The SARS-CoV-2 RNA is generally detectable in upper and lower respiratory specimens during the acute phase of infection. Negative results do not preclude SARS-CoV-2 infection, do not  rule out co-infections with other pathogens, and should not be used as the sole basis for treatment or other patient management decisions. Negative results must be combined with clinical observations, patient history, and epidemiological information. The expected result is Negative. Fact Sheet for Patients: HairSlick.no Fact Sheet for Healthcare Providers: quierodirigir.com This test is not yet approved or cleared by the [place] FDA and  has been authorized for detection and/or diagnosis of SARS-CoV-2 by FDA under an Emergency Use Authorization (EUA). This EUA will remain  in effect (meaning this test can be used) for the duration of the COVID-19 declaration under Section 56 4(b)(1) of the Act, 21 U.S.C. section 360bbb-3(b)(1), unless the authorization is terminated or revoked sooner. Performed at [facility], [address], [place], [place] [address]      Labs: BNP (last 3 results) No results for input(s): BNP in the last 8760 hours. Basic Metabolic Panel: Recent Labs  Lab 01/20/20 1558 01/20/20 1607 01/20/20 1937 01/22/20 0531  NA 138 136 135 139  K 4.3 4.0 4.3 3.9  CL 104 103  --  104  CO2 20*  --   --  20*  GLUCOSE 111* 109*  --  77  BUN 11 12  --  9  CREATININE 0.80 0.80  --  0.79  CALCIUM 9.1  --   --  8.7*   Liver Function Tests: Recent Labs  Lab 01/20/20 1558  AST 22  ALT 13  ALKPHOS 104  BILITOT 1.3*  PROT 6.5  ALBUMIN 3.8   No results for input(s): LIPASE, AMYLASE in the last 168 hours. No results for input(s): AMMONIA in the last 168 hours. CBC: Recent Labs  Lab 01/20/20 1558 01/20/20 1607 01/21/20 0341 01/22/20 0531 01/23/20 0332 01/24/20 0408 01/25/20 0354  WBC 4.2   < > 6.0 6.8 5.5 7.0 6.7  NEUTROABS 3.1  --  4.3  --   --   --   --   HGB 14.5   < > 13.9 13.4 14.4 14.6 15.0  HCT 46.4*   < > 42.8 41.7 45.2 44.8 45.6  MCV 107.9*   < > 106.2* 106.6* 106.9* 105.9* 104.3*   PLT 123*   < > 129* 111* 95* 78* 112*   < > = values in this interval not displayed.   Cardiac Enzymes: No results for input(s): CKTOTAL, CKMB, CKMBINDEX, TROPONINI in the last 168 hours. BNP: Invalid input(s): POCBNP CBG: Recent Labs  Lab 01/20/20 1602  GLUCAP 105*   D-Dimer No results for input(s): DDIMER in the last 72 hours. Hgb A1c No results for input(s): HGBA1C in the last 72 hours. Lipid Profile No results for input(s): CHOL, HDL, LDLCALC, TRIG, CHOLHDL, LDLDIRECT in the last 72 hours. Thyroid function studies Recent Labs    01/25/20 0823  TSH 12.397*   Anemia work up Recent Labs    01/25/20 0823  VITAMINB12 1,174*   Urinalysis    Component Value Date/Time   COLORURINE YELLOW 01/20/2020 1700   APPEARANCEUR CLEAR 01/20/2020 1700   LABSPEC 1.017 01/20/2020 1700   PHURINE 7.0 01/20/2020 1700   GLUCOSEU NEGATIVE 01/20/2020  1700   HGBUR SMALL (A) 01/20/2020 1700   BILIRUBINUR NEGATIVE 01/20/2020 1700   KETONESUR 5 (A) 01/20/2020 1700   PROTEINUR NEGATIVE 01/20/2020 1700   NITRITE NEGATIVE 01/20/2020 1700   LEUKOCYTESUR TRACE (A) 01/20/2020 1700   Sepsis Labs Invalid input(s): PROCALCITONIN,  WBC,  LACTICIDVEN Microbiology Recent Results (from the past 240 hour(s))  Respiratory Panel by RT PCR (Flu A&B, Covid) - Nasopharyngeal Swab     Status: None   Collection Time: 01/20/20  4:35 PM   Specimen: Nasopharyngeal Swab  Result Value Ref Range Status   SARS Coronavirus 2 by RT PCR NEGATIVE NEGATIVE Final    Comment: (NOTE) SARS-CoV-2 target nucleic acids are NOT DETECTED. The SARS-CoV-2 RNA is generally detectable in upper respiratoy specimens during the acute phase of infection. The lowest concentration of SARS-CoV-2 viral copies this assay can detect is 131 copies/mL. A negative result does not preclude SARS-Cov-2 infection and should not be used as the sole basis for treatment or other patient management decisions. A negative result may occur with   improper specimen collection/handling, submission of specimen other than nasopharyngeal swab, presence of viral mutation(s) within the areas targeted by this assay, and inadequate number of viral copies (<131 copies/mL). A negative result must be combined with clinical observations, patient history, and epidemiological information. The expected result is Negative. Fact Sheet for Patients:  https://www.moore.com/https://www.fda.gov/media/142436/download Fact Sheet for Healthcare Providers:  https://www.young.biz/https://www.fda.gov/media/142435/download This test is not yet ap proved or cleared by the [place] FDA and  has been authorized for detection and/or diagnosis of SARS-CoV-2 by FDA under an Emergency Use Authorization (EUA). This EUA will remain  in effect (meaning this test can be used) for the duration of the COVID-19 declaration under Section 564(b)(1) of the Act, 21 U.S.C. section 360bbb-3(b)(1), unless the authorization is terminated or revoked sooner.    Influenza A by PCR NEGATIVE NEGATIVE Final   Influenza B by PCR NEGATIVE NEGATIVE Final    Comment: (NOTE) The Xpert Xpress SARS-CoV-2/FLU/RSV assay is intended as an aid in  the diagnosis of influenza from Nasopharyngeal swab specimens and  should not be used as a sole basis for treatment. Nasal washings and  aspirates are unacceptable for Xpert Xpress SARS-CoV-2/FLU/RSV  testing. Fact Sheet for Patients: https://www.moore.com/https://www.fda.gov/media/142436/download Fact Sheet for Healthcare Providers: https://www.young.biz/https://www.fda.gov/media/142435/download This test is not yet approved or cleared by the [place] FDA and  has been authorized for detection and/or diagnosis of SARS-CoV-2 by  FDA under an Emergency Use Authorization (EUA). This EUA will remain  in effect (meaning this test can be used) for the duration of the  Covid-19 declaration under Section 564(b)(1) of the Act, 21  U.S.C. section 360bbb-3(b)(1), unless the authorization is  terminated or revoked. Performed at  [facility], [address] [place], [place] [address]   SARS CORONAVIRUS 2 (TAT 6-24 HRS) Nasopharyngeal Nasopharyngeal Swab     Status: None   Collection Time: 01/23/20  5:10 PM   Specimen: Nasopharyngeal Swab  Result Value Ref Range Status   SARS Coronavirus 2 NEGATIVE NEGATIVE Final    Comment: (NOTE) SARS-CoV-2 target nucleic acids are NOT DETECTED. The SARS-CoV-2 RNA is generally detectable in upper and lower respiratory specimens during the acute phase of infection. Negative results do not preclude SARS-CoV-2 infection, do not rule out co-infections with other pathogens, and should not be used as the sole basis for treatment or other patient management decisions. Negative results must be combined with clinical observations, patient history, and epidemiological information. The expected  result is Negative. Fact Sheet for Patients: HairSlick.no Fact Sheet for Healthcare Providers: quierodirigir.com This test is not yet approved or cleared by the [place] FDA and  has been authorized for detection and/or diagnosis of SARS-CoV-2 by FDA under an Emergency Use Authorization (EUA). This EUA will remain  in effect (meaning this test can be used) for the duration of the COVID-19 declaration under Section 56 4(b)(1) of the Act, 21 U.S.C. section 360bbb-3(b)(1), unless the authorization is terminated or revoked sooner. Performed at [facility], [address], [place], [place] [address]      Time coordinating discharge:  I have spent 35 minutes face to face with the patient and on the ward discussing the patients care, assessment, plan and disposition with other care givers. >50% of the time was devoted counseling the patient about the risks and benefits of treatment/Discharge disposition and coordinating care.   SIGNED:   Dimple Nanas, MD  [facility] 01/25/2020, 10:30 AM   If 7PM-7AM,  please contact night-coverage

## 2020-01-25 NOTE — TOC Transition Note (Signed)
Transition of Care [facility]) - CM/SW Discharge Note   Patient Details  Name: Crystal Espinoza MRN: [id number] Date of Birth: September 02, 1929  [facility]) CM/SW Contact:  Terrilee Croak, Student-Social Work Phone Number: 01/25/2020, 11:10 AM   Clinical Narrative:    Call to report number [phone] Rm # [phone]   Final next level of care: Skilled Nursing Facility Barriers to Discharge: Barriers Resolved   Patient Goals and CMS Choice   CMS Medicare.gov Compare Post Acute Care list provided to:: Patient Represenative (must comment)(Mark Ramer) Choice offered to / list presented to : Adult Children  Discharge Placement              Patient chooses bed at: [facility]) Patient to be transferred to facility by: [facility] Name of family member notified: Carena Stream, [phone] Patient and family notified of of transfer: 01/25/20  Discharge Plan and Services                                     Social Determinants of Health (SDOH) Interventions     Readmission Risk Interventions No flowsheet data found.

## 2020-01-25 NOTE — Care Management Important Message (Signed)
Important Message  Patient Details  Name: Crystal Espinoza MRN: [id number] Date of Birth: [birth year]   Medicare Important Message Given:  Yes     [name] Stefan Church 01/25/2020, 3:16 PM

## 2020-01-26 DIAGNOSIS — I4892 Unspecified atrial flutter: Secondary | ICD-10-CM | POA: Diagnosis not present

## 2020-01-26 DIAGNOSIS — E039 Hypothyroidism, unspecified: Secondary | ICD-10-CM | POA: Diagnosis not present

## 2020-01-26 DIAGNOSIS — R569 Unspecified convulsions: Secondary | ICD-10-CM | POA: Diagnosis not present

## 2020-01-26 LAB — FOLATE RBC
Folate, Hemolysate: 328 ng/mL
Folate, RBC: 790 ng/mL (ref >498)
Hematocrit: 41.5 % (ref 34.0–46.6)

## 2020-01-27 DIAGNOSIS — I4892 Unspecified atrial flutter: Secondary | ICD-10-CM | POA: Diagnosis not present

## 2020-01-27 DIAGNOSIS — R569 Unspecified convulsions: Secondary | ICD-10-CM | POA: Diagnosis not present

## 2020-02-02 DIAGNOSIS — I10 Essential (primary) hypertension: Secondary | ICD-10-CM | POA: Diagnosis not present

## 2020-02-02 DIAGNOSIS — I4892 Unspecified atrial flutter: Secondary | ICD-10-CM | POA: Diagnosis not present

## 2020-02-02 DIAGNOSIS — I272 Pulmonary hypertension, unspecified: Secondary | ICD-10-CM | POA: Diagnosis not present

## 2020-02-02 DIAGNOSIS — R569 Unspecified convulsions: Secondary | ICD-10-CM | POA: Diagnosis not present

## 2020-02-11 DIAGNOSIS — R1310 Dysphagia, unspecified: Secondary | ICD-10-CM | POA: Diagnosis not present

## 2020-02-11 DIAGNOSIS — I10 Essential (primary) hypertension: Secondary | ICD-10-CM | POA: Diagnosis not present

## 2020-02-11 DIAGNOSIS — E039 Hypothyroidism, unspecified: Secondary | ICD-10-CM | POA: Diagnosis not present

## 2020-02-11 DIAGNOSIS — I4892 Unspecified atrial flutter: Secondary | ICD-10-CM | POA: Diagnosis not present

## 2020-02-21 DIAGNOSIS — G8311 Monoplegia of lower limb affecting right dominant side: Secondary | ICD-10-CM | POA: Diagnosis not present

## 2020-02-21 DIAGNOSIS — R1311 Dysphagia, oral phase: Secondary | ICD-10-CM | POA: Diagnosis not present

## 2020-02-21 DIAGNOSIS — R4701 Aphasia: Secondary | ICD-10-CM | POA: Diagnosis not present

## 2020-02-21 DIAGNOSIS — R627 Adult failure to thrive: Secondary | ICD-10-CM | POA: Diagnosis not present

## 2020-02-21 DIAGNOSIS — I119 Hypertensive heart disease without heart failure: Secondary | ICD-10-CM | POA: Diagnosis not present

## 2020-02-21 DIAGNOSIS — G8314 Monoplegia of lower limb affecting left nondominant side: Secondary | ICD-10-CM | POA: Diagnosis not present

## 2020-02-21 DIAGNOSIS — R296 Repeated falls: Secondary | ICD-10-CM | POA: Diagnosis not present

## 2020-02-21 DIAGNOSIS — I4892 Unspecified atrial flutter: Secondary | ICD-10-CM | POA: Diagnosis not present

## 2020-02-21 DIAGNOSIS — I10 Essential (primary) hypertension: Secondary | ICD-10-CM | POA: Diagnosis not present

## 2020-02-21 DIAGNOSIS — E039 Hypothyroidism, unspecified: Secondary | ICD-10-CM | POA: Diagnosis not present

## 2020-02-21 DIAGNOSIS — M341 CR(E)ST syndrome: Secondary | ICD-10-CM | POA: Diagnosis not present

## 2020-02-23 DIAGNOSIS — R296 Repeated falls: Secondary | ICD-10-CM | POA: Diagnosis not present

## 2020-02-23 DIAGNOSIS — I10 Essential (primary) hypertension: Secondary | ICD-10-CM | POA: Diagnosis not present

## 2020-02-23 DIAGNOSIS — I4892 Unspecified atrial flutter: Secondary | ICD-10-CM | POA: Diagnosis not present

## 2020-02-23 DIAGNOSIS — R4701 Aphasia: Secondary | ICD-10-CM | POA: Diagnosis not present

## 2020-02-23 DIAGNOSIS — E039 Hypothyroidism, unspecified: Secondary | ICD-10-CM | POA: Diagnosis not present

## 2020-02-23 DIAGNOSIS — G8314 Monoplegia of lower limb affecting left nondominant side: Secondary | ICD-10-CM | POA: Diagnosis not present

## 2020-02-23 DIAGNOSIS — G8311 Monoplegia of lower limb affecting right dominant side: Secondary | ICD-10-CM | POA: Diagnosis not present

## 2020-02-23 DIAGNOSIS — M341 CR(E)ST syndrome: Secondary | ICD-10-CM | POA: Diagnosis not present

## 2020-02-23 DIAGNOSIS — R1311 Dysphagia, oral phase: Secondary | ICD-10-CM | POA: Diagnosis not present

## 2020-02-23 DIAGNOSIS — R627 Adult failure to thrive: Secondary | ICD-10-CM | POA: Diagnosis not present

## 2020-02-23 DIAGNOSIS — I119 Hypertensive heart disease without heart failure: Secondary | ICD-10-CM | POA: Diagnosis not present

## 2020-02-24 DIAGNOSIS — R4701 Aphasia: Secondary | ICD-10-CM | POA: Diagnosis not present

## 2020-02-24 DIAGNOSIS — G8314 Monoplegia of lower limb affecting left nondominant side: Secondary | ICD-10-CM | POA: Diagnosis not present

## 2020-02-24 DIAGNOSIS — I10 Essential (primary) hypertension: Secondary | ICD-10-CM | POA: Diagnosis not present

## 2020-02-24 DIAGNOSIS — M341 CR(E)ST syndrome: Secondary | ICD-10-CM | POA: Diagnosis not present

## 2020-02-24 DIAGNOSIS — I119 Hypertensive heart disease without heart failure: Secondary | ICD-10-CM | POA: Diagnosis not present

## 2020-02-24 DIAGNOSIS — I4892 Unspecified atrial flutter: Secondary | ICD-10-CM | POA: Diagnosis not present

## 2020-02-24 DIAGNOSIS — R296 Repeated falls: Secondary | ICD-10-CM | POA: Diagnosis not present

## 2020-02-24 DIAGNOSIS — G8311 Monoplegia of lower limb affecting right dominant side: Secondary | ICD-10-CM | POA: Diagnosis not present

## 2020-02-24 DIAGNOSIS — E039 Hypothyroidism, unspecified: Secondary | ICD-10-CM | POA: Diagnosis not present

## 2020-02-24 DIAGNOSIS — R627 Adult failure to thrive: Secondary | ICD-10-CM | POA: Diagnosis not present

## 2020-02-24 DIAGNOSIS — R1311 Dysphagia, oral phase: Secondary | ICD-10-CM | POA: Diagnosis not present

## 2020-02-25 DIAGNOSIS — R1311 Dysphagia, oral phase: Secondary | ICD-10-CM | POA: Diagnosis not present

## 2020-02-25 DIAGNOSIS — I4892 Unspecified atrial flutter: Secondary | ICD-10-CM | POA: Diagnosis not present

## 2020-02-25 DIAGNOSIS — G8311 Monoplegia of lower limb affecting right dominant side: Secondary | ICD-10-CM | POA: Diagnosis not present

## 2020-02-25 DIAGNOSIS — I10 Essential (primary) hypertension: Secondary | ICD-10-CM | POA: Diagnosis not present

## 2020-02-25 DIAGNOSIS — G8314 Monoplegia of lower limb affecting left nondominant side: Secondary | ICD-10-CM | POA: Diagnosis not present

## 2020-02-25 DIAGNOSIS — E039 Hypothyroidism, unspecified: Secondary | ICD-10-CM | POA: Diagnosis not present

## 2020-02-25 DIAGNOSIS — M341 CR(E)ST syndrome: Secondary | ICD-10-CM | POA: Diagnosis not present

## 2020-02-25 DIAGNOSIS — R296 Repeated falls: Secondary | ICD-10-CM | POA: Diagnosis not present

## 2020-02-25 DIAGNOSIS — R4701 Aphasia: Secondary | ICD-10-CM | POA: Diagnosis not present

## 2020-02-25 DIAGNOSIS — I119 Hypertensive heart disease without heart failure: Secondary | ICD-10-CM | POA: Diagnosis not present

## 2020-02-25 DIAGNOSIS — R627 Adult failure to thrive: Secondary | ICD-10-CM | POA: Diagnosis not present

## 2020-02-28 DIAGNOSIS — G8314 Monoplegia of lower limb affecting left nondominant side: Secondary | ICD-10-CM | POA: Diagnosis not present

## 2020-02-28 DIAGNOSIS — I4892 Unspecified atrial flutter: Secondary | ICD-10-CM | POA: Diagnosis not present

## 2020-02-28 DIAGNOSIS — R296 Repeated falls: Secondary | ICD-10-CM | POA: Diagnosis not present

## 2020-02-28 DIAGNOSIS — R4701 Aphasia: Secondary | ICD-10-CM | POA: Diagnosis not present

## 2020-02-28 DIAGNOSIS — G8311 Monoplegia of lower limb affecting right dominant side: Secondary | ICD-10-CM | POA: Diagnosis not present

## 2020-02-28 DIAGNOSIS — R627 Adult failure to thrive: Secondary | ICD-10-CM | POA: Diagnosis not present

## 2020-02-28 DIAGNOSIS — E039 Hypothyroidism, unspecified: Secondary | ICD-10-CM | POA: Diagnosis not present

## 2020-02-28 DIAGNOSIS — I10 Essential (primary) hypertension: Secondary | ICD-10-CM | POA: Diagnosis not present

## 2020-02-28 DIAGNOSIS — M341 CR(E)ST syndrome: Secondary | ICD-10-CM | POA: Diagnosis not present

## 2020-02-28 DIAGNOSIS — I119 Hypertensive heart disease without heart failure: Secondary | ICD-10-CM | POA: Diagnosis not present

## 2020-02-28 DIAGNOSIS — R1311 Dysphagia, oral phase: Secondary | ICD-10-CM | POA: Diagnosis not present

## 2020-03-01 DIAGNOSIS — R1311 Dysphagia, oral phase: Secondary | ICD-10-CM | POA: Diagnosis not present

## 2020-03-01 DIAGNOSIS — I4892 Unspecified atrial flutter: Secondary | ICD-10-CM | POA: Diagnosis not present

## 2020-03-01 DIAGNOSIS — I10 Essential (primary) hypertension: Secondary | ICD-10-CM | POA: Diagnosis not present

## 2020-03-01 DIAGNOSIS — R627 Adult failure to thrive: Secondary | ICD-10-CM | POA: Diagnosis not present

## 2020-03-01 DIAGNOSIS — R4701 Aphasia: Secondary | ICD-10-CM | POA: Diagnosis not present

## 2020-03-01 DIAGNOSIS — G8311 Monoplegia of lower limb affecting right dominant side: Secondary | ICD-10-CM | POA: Diagnosis not present

## 2020-03-01 DIAGNOSIS — E039 Hypothyroidism, unspecified: Secondary | ICD-10-CM | POA: Diagnosis not present

## 2020-03-01 DIAGNOSIS — I119 Hypertensive heart disease without heart failure: Secondary | ICD-10-CM | POA: Diagnosis not present

## 2020-03-01 DIAGNOSIS — R296 Repeated falls: Secondary | ICD-10-CM | POA: Diagnosis not present

## 2020-03-01 DIAGNOSIS — M341 CR(E)ST syndrome: Secondary | ICD-10-CM | POA: Diagnosis not present

## 2020-03-01 DIAGNOSIS — G8314 Monoplegia of lower limb affecting left nondominant side: Secondary | ICD-10-CM | POA: Diagnosis not present

## 2020-03-02 ENCOUNTER — Ambulatory Visit: Payer: Medicare Other | Admitting: Neurology

## 2020-03-02 ENCOUNTER — Encounter: Payer: Self-pay | Admitting: Neurology

## 2020-03-02 ENCOUNTER — Telehealth: Payer: Self-pay | Admitting: *Deleted

## 2020-03-02 NOTE — Telephone Encounter (Signed)
No showed new patient appointment. 

## 2020-03-06 DIAGNOSIS — I10 Essential (primary) hypertension: Secondary | ICD-10-CM | POA: Diagnosis not present

## 2020-03-06 DIAGNOSIS — I4892 Unspecified atrial flutter: Secondary | ICD-10-CM | POA: Diagnosis not present

## 2020-03-06 DIAGNOSIS — E039 Hypothyroidism, unspecified: Secondary | ICD-10-CM | POA: Diagnosis not present

## 2020-03-06 DIAGNOSIS — G8311 Monoplegia of lower limb affecting right dominant side: Secondary | ICD-10-CM | POA: Diagnosis not present

## 2020-03-06 DIAGNOSIS — I119 Hypertensive heart disease without heart failure: Secondary | ICD-10-CM | POA: Diagnosis not present

## 2020-03-06 DIAGNOSIS — G8314 Monoplegia of lower limb affecting left nondominant side: Secondary | ICD-10-CM | POA: Diagnosis not present

## 2020-03-06 DIAGNOSIS — M341 CR(E)ST syndrome: Secondary | ICD-10-CM | POA: Diagnosis not present

## 2020-03-06 DIAGNOSIS — R296 Repeated falls: Secondary | ICD-10-CM | POA: Diagnosis not present

## 2020-03-06 DIAGNOSIS — R4701 Aphasia: Secondary | ICD-10-CM | POA: Diagnosis not present

## 2020-03-06 DIAGNOSIS — R1311 Dysphagia, oral phase: Secondary | ICD-10-CM | POA: Diagnosis not present

## 2020-03-06 DIAGNOSIS — R627 Adult failure to thrive: Secondary | ICD-10-CM | POA: Diagnosis not present

## 2020-03-08 DIAGNOSIS — G8311 Monoplegia of lower limb affecting right dominant side: Secondary | ICD-10-CM | POA: Diagnosis not present

## 2020-03-08 DIAGNOSIS — I10 Essential (primary) hypertension: Secondary | ICD-10-CM | POA: Diagnosis not present

## 2020-03-08 DIAGNOSIS — I4892 Unspecified atrial flutter: Secondary | ICD-10-CM | POA: Diagnosis not present

## 2020-03-08 DIAGNOSIS — I119 Hypertensive heart disease without heart failure: Secondary | ICD-10-CM | POA: Diagnosis not present

## 2020-03-08 DIAGNOSIS — G8314 Monoplegia of lower limb affecting left nondominant side: Secondary | ICD-10-CM | POA: Diagnosis not present

## 2020-03-08 DIAGNOSIS — R1311 Dysphagia, oral phase: Secondary | ICD-10-CM | POA: Diagnosis not present

## 2020-03-08 DIAGNOSIS — M341 CR(E)ST syndrome: Secondary | ICD-10-CM | POA: Diagnosis not present

## 2020-03-08 DIAGNOSIS — E039 Hypothyroidism, unspecified: Secondary | ICD-10-CM | POA: Diagnosis not present

## 2020-03-08 DIAGNOSIS — R296 Repeated falls: Secondary | ICD-10-CM | POA: Diagnosis not present

## 2020-03-08 DIAGNOSIS — R627 Adult failure to thrive: Secondary | ICD-10-CM | POA: Diagnosis not present

## 2020-03-08 DIAGNOSIS — R4701 Aphasia: Secondary | ICD-10-CM | POA: Diagnosis not present

## 2020-03-14 DIAGNOSIS — G8314 Monoplegia of lower limb affecting left nondominant side: Secondary | ICD-10-CM | POA: Diagnosis not present

## 2020-03-14 DIAGNOSIS — R4701 Aphasia: Secondary | ICD-10-CM | POA: Diagnosis not present

## 2020-03-14 DIAGNOSIS — R627 Adult failure to thrive: Secondary | ICD-10-CM | POA: Diagnosis not present

## 2020-03-14 DIAGNOSIS — R296 Repeated falls: Secondary | ICD-10-CM | POA: Diagnosis not present

## 2020-03-14 DIAGNOSIS — I4892 Unspecified atrial flutter: Secondary | ICD-10-CM | POA: Diagnosis not present

## 2020-03-14 DIAGNOSIS — M341 CR(E)ST syndrome: Secondary | ICD-10-CM | POA: Diagnosis not present

## 2020-03-14 DIAGNOSIS — I119 Hypertensive heart disease without heart failure: Secondary | ICD-10-CM | POA: Diagnosis not present

## 2020-03-14 DIAGNOSIS — R1311 Dysphagia, oral phase: Secondary | ICD-10-CM | POA: Diagnosis not present

## 2020-03-14 DIAGNOSIS — E039 Hypothyroidism, unspecified: Secondary | ICD-10-CM | POA: Diagnosis not present

## 2020-03-14 DIAGNOSIS — I10 Essential (primary) hypertension: Secondary | ICD-10-CM | POA: Diagnosis not present

## 2020-03-14 DIAGNOSIS — G8311 Monoplegia of lower limb affecting right dominant side: Secondary | ICD-10-CM | POA: Diagnosis not present

## 2020-03-20 DIAGNOSIS — I10 Essential (primary) hypertension: Secondary | ICD-10-CM | POA: Diagnosis not present

## 2020-03-20 DIAGNOSIS — R627 Adult failure to thrive: Secondary | ICD-10-CM | POA: Diagnosis not present

## 2020-03-20 DIAGNOSIS — R4701 Aphasia: Secondary | ICD-10-CM | POA: Diagnosis not present

## 2020-03-20 DIAGNOSIS — G8311 Monoplegia of lower limb affecting right dominant side: Secondary | ICD-10-CM | POA: Diagnosis not present

## 2020-03-20 DIAGNOSIS — M341 CR(E)ST syndrome: Secondary | ICD-10-CM | POA: Diagnosis not present

## 2020-03-20 DIAGNOSIS — R1311 Dysphagia, oral phase: Secondary | ICD-10-CM | POA: Diagnosis not present

## 2020-03-20 DIAGNOSIS — I119 Hypertensive heart disease without heart failure: Secondary | ICD-10-CM | POA: Diagnosis not present

## 2020-03-20 DIAGNOSIS — G8314 Monoplegia of lower limb affecting left nondominant side: Secondary | ICD-10-CM | POA: Diagnosis not present

## 2020-03-20 DIAGNOSIS — I4892 Unspecified atrial flutter: Secondary | ICD-10-CM | POA: Diagnosis not present

## 2020-03-20 DIAGNOSIS — E039 Hypothyroidism, unspecified: Secondary | ICD-10-CM | POA: Diagnosis not present

## 2020-03-20 DIAGNOSIS — R296 Repeated falls: Secondary | ICD-10-CM | POA: Diagnosis not present

## 2020-03-27 DIAGNOSIS — G8311 Monoplegia of lower limb affecting right dominant side: Secondary | ICD-10-CM | POA: Diagnosis not present

## 2020-03-27 DIAGNOSIS — R296 Repeated falls: Secondary | ICD-10-CM | POA: Diagnosis not present

## 2020-03-27 DIAGNOSIS — I10 Essential (primary) hypertension: Secondary | ICD-10-CM | POA: Diagnosis not present

## 2020-03-27 DIAGNOSIS — R627 Adult failure to thrive: Secondary | ICD-10-CM | POA: Diagnosis not present

## 2020-03-27 DIAGNOSIS — M341 CR(E)ST syndrome: Secondary | ICD-10-CM | POA: Diagnosis not present

## 2020-03-27 DIAGNOSIS — I4892 Unspecified atrial flutter: Secondary | ICD-10-CM | POA: Diagnosis not present

## 2020-03-27 DIAGNOSIS — R4701 Aphasia: Secondary | ICD-10-CM | POA: Diagnosis not present

## 2020-03-27 DIAGNOSIS — R1311 Dysphagia, oral phase: Secondary | ICD-10-CM | POA: Diagnosis not present

## 2020-03-27 DIAGNOSIS — I119 Hypertensive heart disease without heart failure: Secondary | ICD-10-CM | POA: Diagnosis not present

## 2020-03-27 DIAGNOSIS — G8314 Monoplegia of lower limb affecting left nondominant side: Secondary | ICD-10-CM | POA: Diagnosis not present

## 2020-03-27 DIAGNOSIS — E039 Hypothyroidism, unspecified: Secondary | ICD-10-CM | POA: Diagnosis not present

## 2020-04-04 DIAGNOSIS — R1311 Dysphagia, oral phase: Secondary | ICD-10-CM | POA: Diagnosis not present

## 2020-04-04 DIAGNOSIS — R627 Adult failure to thrive: Secondary | ICD-10-CM | POA: Diagnosis not present

## 2020-04-04 DIAGNOSIS — M341 CR(E)ST syndrome: Secondary | ICD-10-CM | POA: Diagnosis not present

## 2020-04-04 DIAGNOSIS — I119 Hypertensive heart disease without heart failure: Secondary | ICD-10-CM | POA: Diagnosis not present

## 2020-04-04 DIAGNOSIS — G8311 Monoplegia of lower limb affecting right dominant side: Secondary | ICD-10-CM | POA: Diagnosis not present

## 2020-04-04 DIAGNOSIS — G8314 Monoplegia of lower limb affecting left nondominant side: Secondary | ICD-10-CM | POA: Diagnosis not present

## 2020-04-04 DIAGNOSIS — R4701 Aphasia: Secondary | ICD-10-CM | POA: Diagnosis not present

## 2020-04-04 DIAGNOSIS — E039 Hypothyroidism, unspecified: Secondary | ICD-10-CM | POA: Diagnosis not present

## 2020-04-04 DIAGNOSIS — R296 Repeated falls: Secondary | ICD-10-CM | POA: Diagnosis not present

## 2020-04-04 DIAGNOSIS — I10 Essential (primary) hypertension: Secondary | ICD-10-CM | POA: Diagnosis not present

## 2020-04-04 DIAGNOSIS — I4892 Unspecified atrial flutter: Secondary | ICD-10-CM | POA: Diagnosis not present

## 2020-04-18 ENCOUNTER — Ambulatory Visit (INDEPENDENT_AMBULATORY_CARE_PROVIDER_SITE_OTHER): Payer: Medicare Other | Admitting: *Deleted

## 2020-04-18 DIAGNOSIS — I480 Paroxysmal atrial fibrillation: Secondary | ICD-10-CM | POA: Diagnosis not present

## 2020-04-18 LAB — CUP PACEART REMOTE DEVICE CHECK
Battery Remaining Longevity: 122 mo
Battery Remaining Percentage: 95.5 %
Battery Voltage: 3.01 V
Brady Statistic RV Percent Paced: 77 %
Date Time Interrogation Session: 20210706073723
Implantable Lead Implant Date: 20180626
Implantable Lead Implant Date: 20180626
Implantable Lead Location: 753859
Implantable Lead Location: 753860
Implantable Lead Model: 1999
Implantable Pulse Generator Implant Date: 20180626
Lead Channel Impedance Value: 440 Ohm
Lead Channel Pacing Threshold Amplitude: 0.5 V
Lead Channel Pacing Threshold Pulse Width: 0.5 ms
Lead Channel Sensing Intrinsic Amplitude: 9.5 mV
Lead Channel Setting Pacing Amplitude: 2.5 V
Lead Channel Setting Pacing Pulse Width: 0.5 ms
Lead Channel Setting Sensing Sensitivity: 5 mV
Pulse Gen Model: 2272
Pulse Gen Serial Number: 8918641

## 2020-04-19 NOTE — Progress Notes (Signed)
Remote pacemaker transmission.   

## 2020-06-04 ENCOUNTER — Inpatient Hospital Stay (HOSPITAL_COMMUNITY)
Admission: EM | Admit: 2020-06-04 | Discharge: 2020-06-06 | DRG: 640 | Disposition: A | Discharge status: Home / Self Care | Payer: Medicare Other | Attending: Internal Medicine | Admitting: Internal Medicine

## 2020-06-04 ENCOUNTER — Encounter (HOSPITAL_COMMUNITY): Payer: Self-pay

## 2020-06-04 ENCOUNTER — Other Ambulatory Visit: Payer: Self-pay

## 2020-06-04 ENCOUNTER — Emergency Department (HOSPITAL_COMMUNITY): Payer: Medicare Other

## 2020-06-04 DIAGNOSIS — Z79890 Hormone replacement therapy (postmenopausal): Secondary | ICD-10-CM

## 2020-06-04 DIAGNOSIS — E039 Hypothyroidism, unspecified: Secondary | ICD-10-CM | POA: Diagnosis present

## 2020-06-04 DIAGNOSIS — G9341 Metabolic encephalopathy: Secondary | ICD-10-CM | POA: Diagnosis not present

## 2020-06-04 DIAGNOSIS — R4781 Slurred speech: Secondary | ICD-10-CM | POA: Diagnosis not present

## 2020-06-04 DIAGNOSIS — G9340 Encephalopathy, unspecified: Secondary | ICD-10-CM

## 2020-06-04 DIAGNOSIS — J329 Chronic sinusitis, unspecified: Secondary | ICD-10-CM | POA: Diagnosis not present

## 2020-06-04 DIAGNOSIS — Z9114 Patient's other noncompliance with medication regimen: Secondary | ICD-10-CM | POA: Diagnosis not present

## 2020-06-04 DIAGNOSIS — Z823 Family history of stroke: Secondary | ICD-10-CM | POA: Diagnosis not present

## 2020-06-04 DIAGNOSIS — R4789 Other speech disturbances: Secondary | ICD-10-CM | POA: Diagnosis not present

## 2020-06-04 DIAGNOSIS — E162 Hypoglycemia, unspecified: Secondary | ICD-10-CM | POA: Diagnosis not present

## 2020-06-04 DIAGNOSIS — M79604 Pain in right leg: Secondary | ICD-10-CM | POA: Diagnosis present

## 2020-06-04 DIAGNOSIS — R2689 Other abnormalities of gait and mobility: Secondary | ICD-10-CM

## 2020-06-04 DIAGNOSIS — Z7901 Long term (current) use of anticoagulants: Secondary | ICD-10-CM

## 2020-06-04 DIAGNOSIS — R5381 Other malaise: Secondary | ICD-10-CM | POA: Diagnosis present

## 2020-06-04 DIAGNOSIS — G319 Degenerative disease of nervous system, unspecified: Secondary | ICD-10-CM | POA: Diagnosis not present

## 2020-06-04 DIAGNOSIS — M341 CR(E)ST syndrome: Secondary | ICD-10-CM

## 2020-06-04 DIAGNOSIS — Z743 Need for continuous supervision: Secondary | ICD-10-CM | POA: Diagnosis not present

## 2020-06-04 DIAGNOSIS — I480 Paroxysmal atrial fibrillation: Secondary | ICD-10-CM | POA: Diagnosis not present

## 2020-06-04 DIAGNOSIS — R569 Unspecified convulsions: Secondary | ICD-10-CM

## 2020-06-04 DIAGNOSIS — R946 Abnormal results of thyroid function studies: Secondary | ICD-10-CM | POA: Diagnosis not present

## 2020-06-04 DIAGNOSIS — Z79899 Other long term (current) drug therapy: Secondary | ICD-10-CM | POA: Diagnosis not present

## 2020-06-04 DIAGNOSIS — Z950 Presence of cardiac pacemaker: Secondary | ICD-10-CM

## 2020-06-04 DIAGNOSIS — Z515 Encounter for palliative care: Secondary | ICD-10-CM | POA: Diagnosis not present

## 2020-06-04 DIAGNOSIS — R6889 Other general symptoms and signs: Secondary | ICD-10-CM | POA: Diagnosis not present

## 2020-06-04 DIAGNOSIS — I10 Essential (primary) hypertension: Secondary | ICD-10-CM | POA: Diagnosis not present

## 2020-06-04 DIAGNOSIS — R1310 Dysphagia, unspecified: Secondary | ICD-10-CM | POA: Diagnosis not present

## 2020-06-04 DIAGNOSIS — Z20822 Contact with and (suspected) exposure to covid-19: Secondary | ICD-10-CM | POA: Diagnosis present

## 2020-06-04 DIAGNOSIS — I499 Cardiac arrhythmia, unspecified: Secondary | ICD-10-CM | POA: Diagnosis not present

## 2020-06-04 DIAGNOSIS — R7989 Other specified abnormal findings of blood chemistry: Secondary | ICD-10-CM

## 2020-06-04 DIAGNOSIS — R29810 Facial weakness: Secondary | ICD-10-CM | POA: Diagnosis not present

## 2020-06-04 DIAGNOSIS — R627 Adult failure to thrive: Secondary | ICD-10-CM | POA: Diagnosis not present

## 2020-06-04 DIAGNOSIS — I6782 Cerebral ischemia: Secondary | ICD-10-CM | POA: Diagnosis not present

## 2020-06-04 DIAGNOSIS — I639 Cerebral infarction, unspecified: Secondary | ICD-10-CM

## 2020-06-04 LAB — CBC WITH DIFFERENTIAL/PLATELET
Abs Immature Granulocytes: 0.03 10*3/uL (ref 0.00–0.07)
Basophils Absolute: 0 10*3/uL (ref 0.0–0.1)
Basophils Relative: 0 %
Eosinophils Absolute: 0 10*3/uL (ref 0.0–0.5)
Eosinophils Relative: 0 %
HCT: 42.2 % (ref 36.0–46.0)
Hemoglobin: 13.5 g/dL (ref 12.0–15.0)
Immature Granulocytes: 1 %
Lymphocytes Relative: 12 %
Lymphs Abs: 0.7 10*3/uL (ref 0.7–4.0)
MCH: 34.2 pg — ABNORMAL HIGH (ref 26.0–34.0)
MCHC: 32 g/dL (ref 30.0–36.0)
MCV: 106.8 fL — ABNORMAL HIGH (ref 80.0–100.0)
Monocytes Absolute: 0.3 10*3/uL (ref 0.1–1.0)
Monocytes Relative: 5 %
Neutro Abs: 4.9 10*3/uL (ref 1.7–7.7)
Neutrophils Relative %: 82 %
Platelets: 115 10*3/uL — ABNORMAL LOW (ref 150–400)
RBC: 3.95 MIL/uL (ref 3.87–5.11)
RDW: 14.6 % (ref 11.5–15.5)
WBC: 6 10*3/uL (ref 4.0–10.5)
nRBC: 0 % (ref 0.0–0.2)

## 2020-06-04 LAB — CBG MONITORING, ED
Glucose-Capillary: 146 mg/dL — ABNORMAL HIGH (ref 70–99)
Glucose-Capillary: 43 mg/dL — CL (ref 70–99)
Glucose-Capillary: 46 mg/dL — ABNORMAL LOW (ref 70–99)
Glucose-Capillary: 54 mg/dL — ABNORMAL LOW (ref 70–99)
Glucose-Capillary: 89 mg/dL (ref 70–99)

## 2020-06-04 LAB — COMPREHENSIVE METABOLIC PANEL
ALT: 11 U/L (ref 0–44)
AST: 21 U/L (ref 15–41)
Albumin: 3.9 g/dL (ref 3.5–5.0)
Alkaline Phosphatase: 106 U/L (ref 38–126)
Anion gap: 10 (ref 5–15)
BUN: 11 mg/dL (ref 8–23)
CO2: 25 mmol/L (ref 22–32)
Calcium: 9.3 mg/dL (ref 8.9–10.3)
Chloride: 101 mmol/L (ref 98–111)
Creatinine, Ser: 0.71 mg/dL (ref 0.44–1.00)
GFR calc Af Amer: >60 mL/min (ref >60)
GFR calc non Af Amer: >60 mL/min (ref >60)
Glucose, Bld: 114 mg/dL — ABNORMAL HIGH (ref 70–99)
Potassium: 4.1 mmol/L (ref 3.5–5.1)
Sodium: 136 mmol/L (ref 135–145)
Total Bilirubin: 1.5 mg/dL — ABNORMAL HIGH (ref 0.3–1.2)
Total Protein: 6.5 g/dL (ref 6.5–8.1)

## 2020-06-04 LAB — RAPID URINE DRUG SCREEN, HOSP PERFORMED
Amphetamines: NOT DETECTED
Barbiturates: NOT DETECTED
Benzodiazepines: NOT DETECTED
Cocaine: NOT DETECTED
Opiates: NOT DETECTED
Tetrahydrocannabinol: NOT DETECTED

## 2020-06-04 LAB — URINALYSIS, ROUTINE W REFLEX MICROSCOPIC
Bilirubin Urine: NEGATIVE
Glucose, UA: >=500 mg/dL — AB
Ketones, ur: NEGATIVE mg/dL
Leukocytes,Ua: NEGATIVE
Nitrite: NEGATIVE
Protein, ur: 30 mg/dL — AB
Specific Gravity, Urine: 1.01 (ref 1.005–1.030)
pH: 7 (ref 5.0–8.0)

## 2020-06-04 LAB — LIPASE, BLOOD: Lipase: 23 U/L (ref 11–51)

## 2020-06-04 LAB — TROPONIN I (HIGH SENSITIVITY)
Troponin I (High Sensitivity): 23 ng/L — ABNORMAL HIGH (ref <18)
Troponin I (High Sensitivity): 25 ng/L — ABNORMAL HIGH (ref <18)

## 2020-06-04 LAB — LACTIC ACID, PLASMA: Lactic Acid, Venous: 1.5 mmol/L (ref 0.5–1.9)

## 2020-06-04 LAB — TSH: TSH: 24.336 u[IU]/mL — ABNORMAL HIGH (ref 0.350–4.500)

## 2020-06-04 LAB — SARS CORONAVIRUS 2 BY RT PCR (HOSPITAL ORDER, PERFORMED IN ~~LOC~~ HOSPITAL LAB): SARS Coronavirus 2: NEGATIVE

## 2020-06-04 MED ORDER — DEXTROSE 50 % IV SOLN
1.0000 | Freq: Once | INTRAVENOUS | Status: AC
  Administered 2020-06-04: 50 mL via INTRAVENOUS
  Filled 2020-06-04: qty 50

## 2020-06-04 NOTE — ED Notes (Signed)
RN notified of low CBG and pt provided with 8oz orange juice

## 2020-06-04 NOTE — ED Notes (Signed)
Admitting made aware of low CBG; No new orders at this time besides recheck of CBG at further time. Admitting en route to assess.

## 2020-06-04 NOTE — ED Notes (Signed)
Patient transported to CT 

## 2020-06-04 NOTE — ED Provider Notes (Signed)
[facility] Provider Note   CSN: [id number] Arrival date & time: 06/04/20  1518     History Chief Complaint  Patient presents with  . Hypoglycemia    Mickle PlumbLouise B Gitto is a 86 y.o. female.   Cerebrovascular Accident This is a new problem. The current episode started 2 days ago. The problem occurs constantly. The problem has not changed since onset.Associated symptoms comments: Ems reported acrocyanosis that resolved with O2. Relieved by: O2. She has tried nothing for the symptoms. The treatment provided no relief.       Past Medical History:  Diagnosis Date  . Cataract   . CREST syndrome (HCC)   . Hypertension   . Nausea   . Paroxysmal atrial fibrillation (HCC) 02/02/2017  . Severe pulmonary hypertension (HCC)    on cath 02/2017  . Symptomatic bradycardia 04/08/2017    Patient Active Problem List   Diagnosis Date Noted  . Hypoglycemia 06/05/2020  . Encephalopathy 06/05/2020  . Acute encephalopathy 06/04/2020  . Seizure (HCC)   . CVA (cerebral vascular accident) (HCC) 01/20/2020  . AMS (altered mental status) 01/20/2020  . SOB (shortness of breath)   . Pacemaker 06/30/2019  . Symptomatic bradycardia 04/08/2017  . Paroxysmal atrial fibrillation (HCC) 02/02/2017  . RBBB 02/27/2014  . PAC (premature atrial contraction) 02/27/2014  . CREST syndrome (HCC) 02/27/2014    Past Surgical History:  Procedure Laterality Date  . ABDOMINAL HYSTERECTOMY    . EYE SURGERY    . PACEMAKER IMPLANT N/A 04/08/2017   Procedure: Pacemaker Implant;  Surgeon: Marinus Mawaylor, Gregg W, MD;  [facility];  Service: Cardiovascular;  Laterality: N/A;     OB History   No obstetric history on file.     Family History  Problem Relation Age of Onset  . Heart disease Mother   . Stroke Father   . Stroke Sister   . Stroke Sister   . Cancer Sister   . Diabetes Sister     Social History   Tobacco Use  . Smoking status: Never Smoker  .  Smokeless tobacco: Never Used  Vaping Use  . Vaping Use: Never used  Substance Use Topics  . Alcohol use: No  . Drug use: No    Home Medications Prior to Admission medications   Medication Sig Start Date End Date Taking? Authorizing Provider  amLODipine (NORVASC) 5 MG tablet Take 1 tablet (5 mg total) by mouth daily. *NEEDS MD VISIT FOR FURTHER REFILLS-PLEASE CALL TO SCHEDULE.* 08/11/19  Yes Chilton Siandolph, Tiffany, MD  levETIRAcetam (KEPPRA) 250 MG tablet Take 1 tablet (250 mg total) by mouth 2 (two) times daily. 01/25/20  Yes Amin, Loura HaltAnkit Chirag, MD  levothyroxine (SYNTHROID) 100 MCG tablet Take 100 mcg by mouth daily.    Yes [provider]  Rivaroxaban (XARELTO) 15 MG TABS tablet Take 1 tablet (15 mg total) by mouth daily with supper. 01/25/20  Yes Amin, Loura HaltAnkit Chirag, MD  atorvastatin (LIPITOR) 40 MG tablet Take 1 tablet (40 mg total) by mouth daily at 6 PM. 01/25/20   Amin, Loura HaltAnkit Chirag, MD    Allergies    Levaquin [levofloxacin]  Review of Systems   Review of Systems  Unable to perform ROS: Acuity of condition  Skin: Positive for color change.  Neurological: Positive for speech difficulty.       Gait difficulty     Physical Exam Updated Vital Signs BP (!) 163/84   Pulse (!) 59   Temp 97.7 F (36.5 C) (  Oral)   Resp (!) 21   Ht 5\' 6"  (1.676 m)   Wt 54.7 kg   SpO2 100%   BMI 19.46 kg/m   Physical Exam Vitals and nursing note reviewed. Exam conducted with a chaperone present.  Constitutional:      General: She is not in acute distress.    Appearance: Normal appearance.  HENT:     Head: Normocephalic and atraumatic.     Nose: No rhinorrhea.  Eyes:     General:        Right eye: No discharge.        Left eye: No discharge.     Conjunctiva/sclera: Conjunctivae normal.  Cardiovascular:     Rate and Rhythm: Normal rate and regular rhythm.  Pulmonary:     Effort: Pulmonary effort is normal. No respiratory distress.     Breath sounds: No stridor.     Comments:  Tachypnea Abdominal:     General: Abdomen is flat. There is no distension.     Palpations: Abdomen is soft.     Tenderness: There is no abdominal tenderness.  Musculoskeletal:        General: No tenderness or signs of injury.  Skin:    General: Skin is warm and dry.  Neurological:     General: No focal deficit present.     Mental Status: She is alert. Mental status is at baseline. She is disoriented.     Motor: No weakness.     Comments: Unable to answer questions to identify objects such as glasses, following commands though equal grip strength in upper extremities equal proximal motor strength in the upper extremities same in the bottom, unable to cooperate with sensory exam though does respond to stimuli, touch and pinch, no facial asymmetry speech is difficult to understand and answers nonsensically  Psychiatric:        Mood and Affect: Mood normal.        Behavior: Behavior normal.     ED Results / Procedures / Treatments   Labs (all labs ordered are listed, but only abnormal results are displayed) Labs Reviewed  CBC WITH DIFFERENTIAL/PLATELET - Abnormal; Notable for the following components:      Result Value   MCV 106.8 (*)    MCH 34.2 (*)    Platelets 115 (*)    All other components within normal limits  COMPREHENSIVE METABOLIC PANEL - Abnormal; Notable for the following components:   Glucose, Bld 114 (*)    Total Bilirubin 1.5 (*)    All other components within normal limits  URINALYSIS, ROUTINE W REFLEX MICROSCOPIC - Abnormal; Notable for the following components:   Glucose, UA >=500 (*)    Hgb urine dipstick MODERATE (*)    Protein, ur 30 (*)    Bacteria, UA RARE (*)    All other components within normal limits  TSH - Abnormal; Notable for the following components:   TSH 24.336 (*)    All other components within normal limits  BASIC METABOLIC PANEL - Abnormal; Notable for the following components:   CO2 21 (*)    Glucose, Bld 101 (*)    BUN 6 (*)    All other  components within normal limits  CBC - Abnormal; Notable for the following components:   MCV 109.0 (*)    MCH 34.4 (*)    Platelets 97 (*)    All other components within normal limits  CBG MONITORING, ED - Abnormal; Notable for the following components:   Glucose-Capillary  43 (*)    All other components within normal limits  CBG MONITORING, ED - Abnormal; Notable for the following components:   Glucose-Capillary 146 (*)    All other components within normal limits  CBG MONITORING, ED - Abnormal; Notable for the following components:   Glucose-Capillary 54 (*)    All other components within normal limits  CBG MONITORING, ED - Abnormal; Notable for the following components:   Glucose-Capillary 46 (*)    All other components within normal limits  CBG MONITORING, ED - Abnormal; Notable for the following components:   Glucose-Capillary 134 (*)    All other components within normal limits  CBG MONITORING, ED - Abnormal; Notable for the following components:   Glucose-Capillary 35 (*)    All other components within normal limits  CBG MONITORING, ED - Abnormal; Notable for the following components:   Glucose-Capillary 182 (*)    All other components within normal limits  CBG MONITORING, ED - Abnormal; Notable for the following components:   Glucose-Capillary 123 (*)    All other components within normal limits  CBG MONITORING, ED - Abnormal; Notable for the following components:   Glucose-Capillary 60 (*)    All other components within normal limits  CBG MONITORING, ED - Abnormal; Notable for the following components:   Glucose-Capillary 16 (*)    All other components within normal limits  CBG MONITORING, ED - Abnormal; Notable for the following components:   Glucose-Capillary 49 (*)    All other components within normal limits  CBG MONITORING, ED - Abnormal; Notable for the following components:   Glucose-Capillary 119 (*)    All other components within normal limits  CBG  MONITORING, ED - Abnormal; Notable for the following components:   Glucose-Capillary 209 (*)    All other components within normal limits  CBG MONITORING, ED - Abnormal; Notable for the following components:   Glucose-Capillary 147 (*)    All other components within normal limits  CBG MONITORING, ED - Abnormal; Notable for the following components:   Glucose-Capillary 107 (*)    All other components within normal limits  TROPONIN I (HIGH SENSITIVITY) - Abnormal; Notable for the following components:   Troponin I (High Sensitivity) 23 (*)    All other components within normal limits  TROPONIN I (HIGH SENSITIVITY) - Abnormal; Notable for the following components:   Troponin I (High Sensitivity) 25 (*)    All other components within normal limits  SARS CORONAVIRUS 2 BY RT PCR (HOSPITAL ORDER, PERFORMED IN [place] [facility])  LIPASE, BLOOD  RAPID URINE DRUG SCREEN, HOSP PERFORMED  LACTIC ACID, PLASMA  CORTISOL  BETA-HYDROXYBUTYRIC ACID  C-PEPTIDE  SULFONYLUREA HYPOGLYCEMICS PANEL, SERUM  CBG MONITORING, ED  CBG MONITORING, ED  CBG MONITORING, ED  CBG MONITORING, ED  CBG MONITORING, ED  CBG MONITORING, ED  CBG MONITORING, ED  CBG MONITORING, ED  CBG MONITORING, ED    EKG EKG Interpretation  Date/Time:  Sunday June 04 2020 15:27:19 EDT Ventricular Rate:  62 PR Interval:    QRS Duration: 128 QT Interval:  426 QTC Calculation: 433 R Axis:   149 Text Interpretation: Sinus rhythm Short PR interval Right bundle branch block Anterolateral infarct, age indeterminate Confirmed by Cherlynn Perches ([id number]) on 06/04/2020 3:33:51 PM Also confirmed by Cherlynn Perches ([id number]), editor [place], LaVerne [phone])  on 06/05/2020 7:46:00 AM   Radiology CT HEAD WO CONTRAST  Result Date: 06/05/2020 CLINICAL DATA:  Stroke. EXAM: CT HEAD WITHOUT CONTRAST TECHNIQUE: Contiguous axial images were  obtained from the base of the skull through the vertex without intravenous contrast. COMPARISON:   06/04/2020 FINDINGS: Brain: Ventricle size and cerebral volume normal for age. Extensive hypodensity throughout the cerebral white matter bilaterally appears chronic and unchanged. Hypodensity in the thalamus bilaterally, most prominent the left. No change since 01/22/2020. Vascular: Negative for hyperdense vessel Skull: Negative Sinuses/Orbits: Mild mucosal edema sphenoid sinus. Remaining sinuses clear. Bilateral cataract extraction. No orbital mass Other: None IMPRESSION: Moderate to extensive chronic microvascular ischemic change stable from prior studies. No acute abnormality. Electronically Signed   By: Marlan Palau M.D.   On: 06/05/2020 13:39   CT Head Wo Contrast  Result Date: 06/04/2020 CLINICAL DATA:  Multiple symptoms including facial droop and expressive aphasia. EXAM: CT HEAD WITHOUT CONTRAST TECHNIQUE: Contiguous axial images were obtained from the base of the skull through the vertex without intravenous contrast. COMPARISON:  January 22, 2020 FINDINGS: Brain: No evidence of acute infarction, hemorrhage, hydrocephalus, extra-axial collection or mass lesion/mass effect. Atrophy and chronic microvascular ischemic changes as before. Vascular: No hyperdense vessel or unexpected calcification. Skull: Normal. Negative for fracture or focal lesion. Sinuses/Orbits: Secretions in LEFT sphenoid sinus. No acute process related to the orbits. Other: None. IMPRESSION: 1. No acute intracranial abnormality. 2. Atrophy and chronic microvascular ischemic changes as before. 3. Mild sinus disease. Electronically Signed   By: Donzetta Kohut M.D.   On: 06/04/2020 17:14   CT ANGIO AO+BIFEM W & OR WO CONTRAST  Result Date: 06/05/2020 CLINICAL DATA:  Lower extremity arterial dissection EXAM: CT ANGIOGRAPHY OF ABDOMINAL AORTA WITH ILIOFEMORAL RUNOFF TECHNIQUE: Multidetector CT imaging of the abdomen, pelvis and lower extremities was performed using the standard protocol during bolus administration of intravenous  contrast. Multiplanar CT image reconstructions and MIPs were obtained to evaluate the vascular anatomy. CONTRAST:  80mL OMNIPAQUE IOHEXOL 350 MG/ML SOLN COMPARISON:  None. FINDINGS: VASCULAR Aorta: Moderate aortoiliac atherosclerotic calcification. No evidence of intramural hematoma, aneurysm, or dissection. Celiac: Unremarkable SMA: Unremarkable Renals: Single renal arteries bilaterally. No evidence of hemodynamically significant stenosis. IMA: Patent at its origin, but segmentally occluded proximally. Superimposed moderate atherosclerotic plaque. RIGHT Lower Extremity Inflow: Widely patent. No evidence of aneurysm or dissection. Internal iliac artery is patent. Outflow: Widely patent. Extensive calcified plaque identified throughout the superficial femoral artery without evidence of focal hemodynamically significant stenosis. Runoff: Moderate atherosclerotic plaque within the popliteal artery without evidence of focal hemodynamically significant stenosis. There is a high-grade focal stenosis of the anterior tibial artery at its origin with diffuse disease seen throughout this vessel which terminates at the level of the a distal tibia. There is segmental occlusion of the tibioperoneal trunk. There is rapid reconstitution of the peroneal and posterior tibial arteries which are heavily diseased throughout their course. The posterior tibial artery continues to form the plantar arch. The peroneal artery reconstitutes the lateral calcaneal artery. LEFT Lower Extremity Inflow: Widely patent. No evidence of aneurysm or dissection. Internal iliac artery is patent. Outflow: Common femoral artery and profundus femoral artery are widely patent. There is extensive calcified atherosclerotic plaque throughout the superficial femoral artery resulting in segmental 50% stenoses within the mid SFA. Runoff: Extensive plaque within the popliteal artery with a serial 50% stenoses in its P1 and P2 segments and 50-75% stenosis in its P3  segment. High-grade focal stenosis of the anterior tibial artery at its origin. Evaluation of the infrapopliteal vasculature is limited by motion artifact. This vessel is heavily disease throughout and terminates at the level of the tibial plafond. Tibioperoneal trunk demonstrates  a greater than 75% stenosis proximally. There is segmental occlusion of the posterior tibial and peroneal arteries at their origins. The peroneal artery is reconstituted and is heavily diseased, ultimately terminating at the level of the tibial plafond. The posterior tibial artery is heavily diseased and there is no definite reconstitution of this vessel. Veins: The veins are not opacified and are poorly assessed on this examination. Review of the MIP images confirms the above findings. NON-VASCULAR Lower chest: Mild global cardiomegaly. Pacemaker leads noted within the right heart. Extensive atherosclerotic calcification within the right coronary artery. Hepatobiliary: Status post cholecystectomy.  Liver unremarkable. Pancreas: Unremarkable Spleen: Unremarkable Adrenals/Urinary Tract: Adrenal glands and kidneys are unremarkable. Bladder is unremarkable. Stomach/Bowel: Moderate stool within the distal colon. The stomach and large and small bowel are otherwise unremarkable. Mild free fluid within the pelvis is nonspecific. No free intraperitoneal gas. Lymphatic: No pathologic adenopathy within the abdomen and pelvis. Reproductive: Status post hysterectomy. No adnexal masses. Other: Rectum unremarkable Musculoskeletal: Advanced degenerative changes are seen within the lumbar spine, hips, and knees. Multiple bone infarcts are seen within the distal femurs bilaterally. IMPRESSION: 1. Short segment occlusion of the proximal inferior mesenteric artery. 2. Bilateral lower extremity runoff disease. On the right, there is single-vessel runoff to the foot. On the left, no definite in line vascular perfusion to the foot is identified. 3. Mild global  cardiomegaly. Extensive atherosclerotic calcification within the right coronary artery. 4. Mild free fluid within the pelvis. This is nonspecific. 5. Advanced degenerative changes within the lumbar spine, hips, and knees. Multiple bone infarcts are seen within the distal femurs bilaterally. Aortic Atherosclerosis (ICD10-I70.0). Electronically Signed   By: Helyn Numbers MD   On: 06/05/2020 02:33    Procedures Procedures (including critical care time)  Medications Ordered in ED Medications  levETIRAcetam (KEPPRA) tablet 250 mg (250 mg Oral Given 06/05/20 1023)  Rivaroxaban (XARELTO) tablet 15 mg (has no administration in time range)  ondansetron (ZOFRAN) tablet 4 mg (has no administration in time range)    Or  ondansetron (ZOFRAN) injection 4 mg (has no administration in time range)  levothyroxine (SYNTHROID, LEVOTHROID) injection 75 mcg (75 mcg Intravenous Given 06/05/20 0738)  hydrALAZINE (APRESOLINE) injection 10 mg (has no administration in time range)  dextrose 10 % infusion ( Intravenous Stopped 06/05/20 1200)  dextrose 50 % solution 50 mL (50 mLs Intravenous Given 06/04/20 1543)  dextrose 50 % solution 50 mL (50 mLs Intravenous Given 06/05/20 0029)  iohexol (OMNIPAQUE) 350 MG/ML injection 80 mL (80 mLs Intravenous Contrast Given 06/05/20 0148)  dextrose 50 % solution 25 g (25 g Intravenous Given 06/05/20 0557)  glucagon (human recombinant) (GLUCAGEN) injection 1 mg (1 mg Intravenous Given 06/05/20 1041)  dextrose 50 % solution 50 mL (1 ampule Intravenous Given 06/05/20 1030)    ED Course  I have reviewed the triage vital signs and the nursing notes.  Pertinent labs & imaging results that were available during my care of the patient were reviewed by me and considered in my medical decision making (see chart for details).    MDM Rules/Calculators/A&P                         CVA type symptoms similar to previous with ambulation difficulties and speech changes.  Worsening over the past 3  days.  Possible metabolic disorder causing this, hypoglycemic, initially corrected with D5 ampule, we will recheck glucose, will look for sources of infection we will scan her head with CT  we will get other metabolic work-up to include a lactic acid and VBG.  She is mildly tachypneic but has no focal lung soundsThe EMS team that picked her up and noted some acrocyanosis that responded quickly to supplemental oxygen.  Spoke to the family who comments on poor nutrition.  EKG is done here and appears to show an atrial flutter at a rate in the 60s.  No acute ischemic changes noted.  She has a pacemaker we will get this interrogated.  Labs reviewed by myself show elevated TSH, perhaps her thyroid meds need adjustment, looking at the trend it has double over the last several months. Trop is elevated but stable. CT head reviewed by myself and radiology shows no acute changes. No signs of UTI. She was hypoglycemic and this was corrected and stabilized. Her sone says that she does not eat well. Family says that her symptoms are improving but she is still not at baseline. She will require admission for further work up. Consult to hospitalist for further management.  The patient will be admitted to the hospitalist.  For the remainder this patient's care please see inpatient team notes.  I will intervene as needed while the patient remains in the emergency department.   Final Clinical Impression(s) / ED Diagnoses Final diagnoses:  Hypoglycemia  Imbalance  Spell of change in speech  Elevated TSH    Rx / DC Orders ED Discharge Orders    None       Sabino Donovan, MD 06/05/20 1900

## 2020-06-04 NOTE — ED Notes (Signed)
This RN ambulated this pt with a walker. Pt was able to ambulate with a walker at baseline per family member. Pt ambulated out the room with minimal mobility issues and was steady on her feet with assistance.

## 2020-06-04 NOTE — ED Triage Notes (Signed)
Pt BIB [facility] from Home with Stroke-Like Sx.   Pt LSN was Friday at 1700. Pt now presents with multiple symptoms including Facial Droop (L), expressive aphagia.   Pt did have previous CVA with no lasting deficits.  Pt does also use walker at home normally.  EMS: CBG 97 BP 200/100 All other VSS with EMS

## 2020-06-05 ENCOUNTER — Observation Stay (HOSPITAL_COMMUNITY): Payer: Medicare Other

## 2020-06-05 ENCOUNTER — Encounter (HOSPITAL_COMMUNITY): Payer: Self-pay | Admitting: Internal Medicine

## 2020-06-05 DIAGNOSIS — I480 Paroxysmal atrial fibrillation: Secondary | ICD-10-CM | POA: Diagnosis not present

## 2020-06-05 DIAGNOSIS — G9389 Other specified disorders of brain: Secondary | ICD-10-CM | POA: Diagnosis not present

## 2020-06-05 DIAGNOSIS — M341 CR(E)ST syndrome: Secondary | ICD-10-CM | POA: Diagnosis present

## 2020-06-05 DIAGNOSIS — R569 Unspecified convulsions: Secondary | ICD-10-CM | POA: Diagnosis present

## 2020-06-05 DIAGNOSIS — Z79890 Hormone replacement therapy (postmenopausal): Secondary | ICD-10-CM | POA: Diagnosis not present

## 2020-06-05 DIAGNOSIS — Z9114 Patient's other noncompliance with medication regimen: Secondary | ICD-10-CM | POA: Diagnosis not present

## 2020-06-05 DIAGNOSIS — R1310 Dysphagia, unspecified: Secondary | ICD-10-CM | POA: Diagnosis present

## 2020-06-05 DIAGNOSIS — I2510 Atherosclerotic heart disease of native coronary artery without angina pectoris: Secondary | ICD-10-CM | POA: Diagnosis not present

## 2020-06-05 DIAGNOSIS — Z20822 Contact with and (suspected) exposure to covid-19: Secondary | ICD-10-CM | POA: Diagnosis present

## 2020-06-05 DIAGNOSIS — R5381 Other malaise: Secondary | ICD-10-CM | POA: Diagnosis present

## 2020-06-05 DIAGNOSIS — R627 Adult failure to thrive: Secondary | ICD-10-CM | POA: Diagnosis present

## 2020-06-05 DIAGNOSIS — E039 Hypothyroidism, unspecified: Secondary | ICD-10-CM | POA: Diagnosis present

## 2020-06-05 DIAGNOSIS — G9341 Metabolic encephalopathy: Secondary | ICD-10-CM | POA: Diagnosis present

## 2020-06-05 DIAGNOSIS — M170 Bilateral primary osteoarthritis of knee: Secondary | ICD-10-CM | POA: Diagnosis not present

## 2020-06-05 DIAGNOSIS — I700 Atherosclerosis of aorta: Secondary | ICD-10-CM | POA: Diagnosis not present

## 2020-06-05 DIAGNOSIS — E162 Hypoglycemia, unspecified: Secondary | ICD-10-CM | POA: Diagnosis not present

## 2020-06-05 DIAGNOSIS — R609 Edema, unspecified: Secondary | ICD-10-CM | POA: Diagnosis not present

## 2020-06-05 DIAGNOSIS — K55069 Acute infarction of intestine, part and extent unspecified: Secondary | ICD-10-CM | POA: Diagnosis not present

## 2020-06-05 DIAGNOSIS — R9082 White matter disease, unspecified: Secondary | ICD-10-CM | POA: Diagnosis not present

## 2020-06-05 DIAGNOSIS — G9340 Encephalopathy, unspecified: Secondary | ICD-10-CM | POA: Diagnosis not present

## 2020-06-05 DIAGNOSIS — Z7901 Long term (current) use of anticoagulants: Secondary | ICD-10-CM | POA: Diagnosis not present

## 2020-06-05 DIAGNOSIS — Z79899 Other long term (current) drug therapy: Secondary | ICD-10-CM | POA: Diagnosis not present

## 2020-06-05 DIAGNOSIS — Z823 Family history of stroke: Secondary | ICD-10-CM | POA: Diagnosis not present

## 2020-06-05 DIAGNOSIS — Z515 Encounter for palliative care: Secondary | ICD-10-CM | POA: Diagnosis present

## 2020-06-05 DIAGNOSIS — I6782 Cerebral ischemia: Secondary | ICD-10-CM | POA: Diagnosis not present

## 2020-06-05 DIAGNOSIS — M79604 Pain in right leg: Secondary | ICD-10-CM | POA: Diagnosis present

## 2020-06-05 LAB — CBG MONITORING, ED
Glucose-Capillary: 134 mg/dL — ABNORMAL HIGH (ref 70–99)
Glucose-Capillary: 97 mg/dL (ref 70–99)
Glucose-Capillary: 72 mg/dL (ref 70–99)
Glucose-Capillary: 35 mg/dL — CL (ref 70–99)
Glucose-Capillary: 182 mg/dL — ABNORMAL HIGH (ref 70–99)
Glucose-Capillary: 123 mg/dL — ABNORMAL HIGH (ref 70–99)
Glucose-Capillary: 71 mg/dL (ref 70–99)
Glucose-Capillary: 85 mg/dL (ref 70–99)
Glucose-Capillary: 16 mg/dL — CL (ref 70–99)
Glucose-Capillary: 60 mg/dL — ABNORMAL LOW (ref 70–99)
Glucose-Capillary: 119 mg/dL — ABNORMAL HIGH (ref 70–99)
Glucose-Capillary: 209 mg/dL — ABNORMAL HIGH (ref 70–99)
Glucose-Capillary: 49 mg/dL — ABNORMAL LOW (ref 70–99)
Glucose-Capillary: 93 mg/dL (ref 70–99)
Glucose-Capillary: 107 mg/dL — ABNORMAL HIGH (ref 70–99)
Glucose-Capillary: 147 mg/dL — ABNORMAL HIGH (ref 70–99)
Glucose-Capillary: 82 mg/dL (ref 70–99)
Glucose-Capillary: 87 mg/dL (ref 70–99)
Glucose-Capillary: 89 mg/dL (ref 70–99)
Glucose-Capillary: 98 mg/dL (ref 70–99)

## 2020-06-05 LAB — CBC
HCT: 42.2 % (ref 36.0–46.0)
Hemoglobin: 13.3 g/dL (ref 12.0–15.0)
MCH: 34.4 pg — ABNORMAL HIGH (ref 26.0–34.0)
MCHC: 31.5 g/dL (ref 30.0–36.0)
MCV: 109 fL — ABNORMAL HIGH (ref 80.0–100.0)
Platelets: 97 10*3/uL — ABNORMAL LOW (ref 150–400)
RBC: 3.87 MIL/uL (ref 3.87–5.11)
RDW: 14.9 % (ref 11.5–15.5)
WBC: 4.8 10*3/uL (ref 4.0–10.5)
nRBC: 0 % (ref 0.0–0.2)

## 2020-06-05 LAB — BASIC METABOLIC PANEL
Anion gap: 13 (ref 5–15)
BUN: 6 mg/dL — ABNORMAL LOW (ref 8–23)
CO2: 21 mmol/L — ABNORMAL LOW (ref 22–32)
Calcium: 9 mg/dL (ref 8.9–10.3)
Chloride: 105 mmol/L (ref 98–111)
Creatinine, Ser: 0.67 mg/dL (ref 0.44–1.00)
GFR calc Af Amer: >60 mL/min (ref >60)
GFR calc non Af Amer: >60 mL/min (ref >60)
Glucose, Bld: 101 mg/dL — ABNORMAL HIGH (ref 70–99)
Potassium: 3.9 mmol/L (ref 3.5–5.1)
Sodium: 139 mmol/L (ref 135–145)

## 2020-06-05 LAB — BETA-HYDROXYBUTYRIC ACID: Beta-Hydroxybutyric Acid: 0.06 mmol/L (ref 0.05–0.27)

## 2020-06-05 LAB — GLUCOSE, CAPILLARY: Glucose-Capillary: 110 mg/dL — ABNORMAL HIGH (ref 70–99)

## 2020-06-05 LAB — CORTISOL: Cortisol, Plasma: 13.9 ug/dL

## 2020-06-05 MED ORDER — GLUCAGON HCL RDNA (DIAGNOSTIC) 1 MG IJ SOLR
INTRAMUSCULAR | Status: AC
  Filled 2020-06-05: qty 1

## 2020-06-05 MED ORDER — LEVETIRACETAM 250 MG PO TABS
250.0000 mg | ORAL_TABLET | Freq: Two times a day (BID) | ORAL | Status: DC
  Administered 2020-06-05 – 2020-06-06 (×4): 250 mg via ORAL
  Filled 2020-06-05 (×5): qty 1

## 2020-06-05 MED ORDER — GLUCAGON HCL RDNA (DIAGNOSTIC) 1 MG IJ SOLR
1.0000 mg | Freq: Once | INTRAMUSCULAR | Status: AC
  Administered 2020-06-05: 1 mg via INTRAVENOUS

## 2020-06-05 MED ORDER — HYDRALAZINE HCL 20 MG/ML IJ SOLN
10.0000 mg | INTRAMUSCULAR | Status: DC | PRN
  Administered 2020-06-05: 10 mg via INTRAVENOUS
  Filled 2020-06-05: qty 1

## 2020-06-05 MED ORDER — DEXTROSE 10 % IV SOLN: INTRAVENOUS | Status: DC

## 2020-06-05 MED ORDER — DEXTROSE-NACL 5-0.9 % IV SOLN: INTRAVENOUS | Status: DC

## 2020-06-05 MED ORDER — DEXTROSE 50 % IV SOLN
INTRAVENOUS | Status: AC
  Administered 2020-06-05: 25 g via INTRAVENOUS
  Filled 2020-06-05: qty 50

## 2020-06-05 MED ORDER — DEXTROSE 50 % IV SOLN: 25.0000 g | INTRAVENOUS | Status: AC

## 2020-06-05 MED ORDER — DEXTROSE 50 % IV SOLN
1.0000 | Freq: Once | INTRAVENOUS | Status: AC
  Administered 2020-06-05: 50 mL via INTRAVENOUS
  Filled 2020-06-05: qty 50

## 2020-06-05 MED ORDER — LEVOTHYROXINE SODIUM 100 MCG/5ML IV SOLN
75.0000 ug | Freq: Every day | INTRAVENOUS | Status: DC
  Administered 2020-06-05 – 2020-06-06 (×2): 75 ug via INTRAVENOUS
  Filled 2020-06-05 (×2): qty 5

## 2020-06-05 MED ORDER — IOHEXOL 350 MG/ML SOLN
80.0000 mL | Freq: Once | INTRAVENOUS | Status: AC | PRN
  Administered 2020-06-05: 80 mL via INTRAVENOUS

## 2020-06-05 MED ORDER — DEXTROSE 50 % IV SOLN
INTRAVENOUS | Status: AC
  Administered 2020-06-05: 1 via INTRAVENOUS
  Filled 2020-06-05: qty 100

## 2020-06-05 MED ORDER — DEXTROSE 50 % IV SOLN: 1.0000 | Freq: Once | INTRAVENOUS | Status: AC

## 2020-06-05 MED ORDER — ONDANSETRON HCL 4 MG PO TABS: 4.0000 mg | ORAL_TABLET | Freq: Four times a day (QID) | ORAL | Status: DC | PRN

## 2020-06-05 MED ORDER — RIVAROXABAN 15 MG PO TABS
15.0000 mg | ORAL_TABLET | Freq: Every day | ORAL | Status: DC
  Filled 2020-06-05: qty 1

## 2020-06-05 MED ORDER — ONDANSETRON HCL 4 MG/2ML IJ SOLN: 4.0000 mg | Freq: Four times a day (QID) | INTRAMUSCULAR | Status: DC | PRN

## 2020-06-05 NOTE — ED Notes (Addendum)
Pt CBG resulted as 16, tips of fingers cyanotic. Findings reported to MD, amp of dextrose administered. CBG increased to 49. Orders placed for IV glucagon by MD.

## 2020-06-05 NOTE — ED Notes (Signed)
CBG 109 April-RN

## 2020-06-05 NOTE — Progress Notes (Signed)
Patient seen and examined.  Patient is poor historian.  I met with patient's husband at the bedside who is also poor historian and has cognitive decline.  Called patient's son to the bedside to discuss.  Apparently, she has been complaining of weakness and leg pain and cramping for some time.  Husband reports that her appetite is not good and she is not a eating well.  Her son went to check on them and found that she has been looking very weak with slurred speech.  Acute metabolic encephalopathy: Cause unknown.  There was some report of hypoglycemia, however she has severe peripheral vascular disease and Raynaud's fingers, there is no hypoglycemia on BMPs anytime as well as sugar from earlobes. Even though, patient could have been suffering from episodic hypoglycemia.  -Hypoglycemia, episodic causing encephalopathy and confusion.  Keep off dextrose.  Verify every blood sugars less than 40 with BMP then only correct.  Check peripheral blood sugars from central areas like earlobes.  As needed dextrose.  Sulfonylurea panels pending.  -Hypothyroidism: Probably not taking medications.  TSH is 24.  Not enough to make slurred speech but can be causing confusion.  Replaced with IV.  Will resume oral replacement tomorrow.  -Has history of TIAs, on arrival CT scan and follow-up CT scan with chronic microvascular changes.  Might have suffered a TIA.  Optimized on Xarelto and antilipid medications, she will not tolerate further medications.  Currently no neurological deficits.  -History of seizure, less likely new seizure.  EEG pending.  -Claudication, significant claudication syndrome.  Visible cyanosis fingertips, CTA with diffuse atherosclerotic disease and vascular blockages.  No acute ischemia.  On statin.  She is not an interventional candidate.   Detailed discussion with patient's son Mr. Elta Guadeloupe and husband that patient likely has failure to thrive, no glucose reserve.  Her extensive atherosclerotic  disease and crest syndrome is untreatable.  Because of significantly low blood sugars and unknown cause of encephalopathy, she will need inpatient monitoring and she needs to spend another night in the hospital to help with diagnosis. If fairly stable by tomorrow morning, will suggest outpatient follow-up.  [facility].

## 2020-06-05 NOTE — ED Notes (Signed)
Pt given orange juice and crackers per April-RN

## 2020-06-05 NOTE — ED Notes (Signed)
Pt transported to CT ?

## 2020-06-05 NOTE — ED Notes (Signed)
209 CBG from [place]

## 2020-06-05 NOTE — H&P (Signed)
History and Physical    Crystal PlumbLouise B Espinoza MVH:[id number] DOB: 02/13/1929 DOA: 06/04/2020  PCP: Merri BrunettePharr, Walter, MD   Patient coming from: Home.  History obtained from patient's son.  Chief Complaint: Slurred speech and weakness.  HPI: Crystal Espinoza is a 86 y.o. female with history of atrial flutter, history of symptomatic AV block status post pacemaker placement, hypertension, hypothyroidism, crest syndrome with pulmonary hypertension admitted in April 2021 for seizures was noticed to have slurred speech and generally weak after patient son went to check on her last evening.  Last seen normal was around February 01, 2020.  Since patient's husband has dementia we do not know when her symptoms started between Friday and last evening.  Per patient's son patient has not been eating well and losing weight for the last 2 months.  Patient also complains of increasing pain in the right lower extremity.  ED Course: In the ER patient was afebrile and appeared nonfocal blood sugar was in the 40s.  Was given D50 following which blood sugar improved but again started dropping.  At this point I started patient on D5 normal saline.  CT head is unremarkable Covid test was negative.  TSH is markedly elevated at 24.336.  Patient admitted for severe hypoglycemia and mental status changes.  Review of Systems: As per HPI, rest all negative.   Past Medical History:  Diagnosis Date   Cataract    CREST syndrome (HCC)    Hypertension    Nausea    Paroxysmal atrial fibrillation (HCC) 02/02/2017   Severe pulmonary hypertension (HCC)    on cath 02/2017   Symptomatic bradycardia 04/08/2017    Past Surgical History:  Procedure Laterality Date   ABDOMINAL HYSTERECTOMY     EYE SURGERY     PACEMAKER IMPLANT N/A 04/08/2017   Procedure: Pacemaker Implant;  Surgeon: Marinus Mawaylor, Gregg W, MD;  Location: [facility];  Service: Cardiovascular;  Laterality: N/A;     reports that she has never smoked. She has  never used smokeless tobacco. She reports that she does not drink alcohol and does not use drugs.  Allergies  Allergen Reactions   Levaquin [Levofloxacin] Other (See Comments)    unknown    Family History  Problem Relation Age of Onset   Heart disease Mother    Stroke Father    Stroke Sister    Stroke Sister    Cancer Sister    Diabetes Sister     Prior to Admission medications   Medication Sig Start Date End Date Taking? Authorizing Provider  amLODipine (NORVASC) 5 MG tablet Take 1 tablet (5 mg total) by mouth daily. *NEEDS MD VISIT FOR FURTHER REFILLS-PLEASE CALL TO SCHEDULE.* 08/11/19   Chilton Siandolph, Tiffany, MD  atorvastatin (LIPITOR) 40 MG tablet Take 1 tablet (40 mg total) by mouth daily at 6 PM. 01/25/20   Amin, Loura HaltAnkit Chirag, MD  calcium citrate-vitamin D (CITRACAL+D) 315-200 MG-UNIT per tablet Take 1 tablet by mouth 2 (two) times daily.    [provider]  Glucosamine-MSM-Hyaluronic Acd (JOINT HEALTH PO) Take 2,500 mg by mouth daily.    [provider]  levETIRAcetam (KEPPRA) 250 MG tablet Take 1 tablet (250 mg total) by mouth 2 (two) times daily. 01/25/20   Amin, Loura HaltAnkit Chirag, MD  levothyroxine (SYNTHROID) 100 MCG tablet Take 100 mcg by mouth daily.     [provider]  Multiple Vitamin (MULTIVITAMIN) tablet Take 1 tablet by mouth daily.    [provider]  Rivaroxaban (XARELTO) 15  MG TABS tablet Take 1 tablet (15 mg total) by mouth daily with supper. 01/25/20   Dimple Nanas, MD    Physical Exam: Constitutional: Moderately built and nourished. Vitals:   06/04/20 2346 06/05/20 0015 06/05/20 0047 06/05/20 0100  BP: (!) 151/81 (!) 147/78 (!) 158/70 (!) 153/71  Pulse: 60 60 60 (!) 59  Resp: 20 (!) 30 (!) 26 (!) 22  Temp:      TempSrc:      SpO2: 95% 97% 95% 97%  Weight:      Height:       Eyes: Anicteric no pallor. ENMT: No discharge from the ears eyes nose or mouth. Neck: No mass felt.  No neck rigidity. Respiratory: No  rhonchi or crepitations. Cardiovascular: S1-S2 heard. Abdomen: Soft nontender bowel sounds present. Musculoskeletal: Poor pulses in the right lower extremity. Skin: Chronic skin changes. Neurologic: Alert awake oriented to her name.  Moves all extremities. Psychiatric: Appears mildly confused.   Labs on Admission: I have personally reviewed following labs and imaging studies  CBC: Recent Labs  Lab 06/04/20 1545  WBC 6.0  NEUTROABS 4.9  HGB 13.5  HCT 42.2  MCV 106.8*  PLT 115*   Basic Metabolic Panel: Recent Labs  Lab 06/04/20 1545  NA 136  K 4.1  CL 101  CO2 25  GLUCOSE 114*  BUN 11  CREATININE 0.71  CALCIUM 9.3   GFR: Estimated Creatinine Clearance: 41.2 mL/min (by C-G formula based on SCr of 0.71 mg/dL). Liver Function Tests: Recent Labs  Lab 06/04/20 1545  AST 21  ALT 11  ALKPHOS 106  BILITOT 1.5*  PROT 6.5  ALBUMIN 3.9   Recent Labs  Lab 06/04/20 1545  LIPASE 23   No results for input(s): AMMONIA in the last 168 hours. Coagulation Profile: No results for input(s): INR, PROTIME in the last 168 hours. Cardiac Enzymes: No results for input(s): CKTOTAL, CKMB, CKMBINDEX, TROPONINI in the last 168 hours. BNP (last 3 results) No results for input(s): PROBNP in the last 8760 hours. HbA1C: No results for input(s): HGBA1C in the last 72 hours. CBG: Recent Labs  Lab 06/04/20 1528 06/04/20 1605 06/04/20 1916 06/04/20 2330 06/04/20 2357  GLUCAP 43* 146* 89 54* 46*   Lipid Profile: No results for input(s): CHOL, HDL, LDLCALC, TRIG, CHOLHDL, LDLDIRECT in the last 72 hours. Thyroid Function Tests: Recent Labs    06/04/20 1545  TSH 24.336*   Anemia Panel: No results for input(s): VITAMINB12, FOLATE, FERRITIN, TIBC, IRON, RETICCTPCT in the last 72 hours. Urine analysis:    Component Value Date/Time   COLORURINE YELLOW 06/04/2020 1610   APPEARANCEUR CLEAR 06/04/2020 1610   LABSPEC 1.010 06/04/2020 1610   PHURINE 7.0 06/04/2020 1610   GLUCOSEU  >=500 (A) 06/04/2020 1610   HGBUR MODERATE (A) 06/04/2020 1610   BILIRUBINUR NEGATIVE 06/04/2020 1610   KETONESUR NEGATIVE 06/04/2020 1610   PROTEINUR 30 (A) 06/04/2020 1610   NITRITE NEGATIVE 06/04/2020 1610   LEUKOCYTESUR NEGATIVE 06/04/2020 1610   Sepsis Labs: @LABRCNTIP (procalcitonin:4,lacticidven:4) ) Recent Results (from the past 240 hour(s))  SARS Coronavirus 2 by RT PCR (hospital order, performed in [facility] hospital lab) Nasopharyngeal Nasopharyngeal Swab     Status: None   Collection Time: 06/04/20  5:27 PM   Specimen: Nasopharyngeal Swab  Result Value Ref Range Status   SARS Coronavirus 2 NEGATIVE NEGATIVE Final    Comment: (NOTE) SARS-CoV-2 target nucleic acids are NOT DETECTED.  The SARS-CoV-2 RNA is generally detectable in upper and lower respiratory specimens during  the acute phase of infection. The lowest concentration of SARS-CoV-2 viral copies this assay can detect is 250 copies / mL. A negative result does not preclude SARS-CoV-2 infection and should not be used as the sole basis for treatment or other patient management decisions.  A negative result may occur with improper specimen collection / handling, submission of specimen other than nasopharyngeal swab, presence of viral mutation(s) within the areas targeted by this assay, and inadequate number of viral copies (<250 copies / mL). A negative result must be combined with clinical observations, patient history, and epidemiological information.  Fact Sheet for Patients:   BoilerBrush.com.cy  Fact Sheet for Healthcare Providers: https://pope.com/  This test is not yet approved or  cleared by the [place] FDA and has been authorized for detection and/or diagnosis of SARS-CoV-2 by FDA under an Emergency Use Authorization (EUA).  This EUA will remain in effect (meaning this test can be used) for the duration of the COVID-19 declaration under Section  564(b)(1) of the Act, 21 U.S.C. section 360bbb-3(b)(1), unless the authorization is terminated or revoked sooner.  Performed at [facility], [address] [place], [place] [address]      Radiological Exams on Admission: CT Head Wo Contrast  Result Date: 06/04/2020 CLINICAL DATA:  Multiple symptoms including facial droop and expressive aphasia. EXAM: CT HEAD WITHOUT CONTRAST TECHNIQUE: Contiguous axial images were obtained from the base of the skull through the vertex without intravenous contrast. COMPARISON:  January 22, 2020 FINDINGS: Brain: No evidence of acute infarction, hemorrhage, hydrocephalus, extra-axial collection or mass lesion/mass effect. Atrophy and chronic microvascular ischemic changes as before. Vascular: No hyperdense vessel or unexpected calcification. Skull: Normal. Negative for fracture or focal lesion. Sinuses/Orbits: Secretions in LEFT sphenoid sinus. No acute process related to the orbits. Other: None. IMPRESSION: 1. No acute intracranial abnormality. 2. Atrophy and chronic microvascular ischemic changes as before. 3. Mild sinus disease. Electronically Signed   By: Donzetta Kohut M.D.   On: 06/04/2020 17:14    EKG: Independently reviewed.  Flutter waves.  Assessment/Plan Principal Problem:   Acute encephalopathy Active Problems:   CREST syndrome (HCC)   Paroxysmal atrial fibrillation (HCC)   Pacemaker   Seizure (HCC)   Hypoglycemia    1. Acute encephalopathy with hypoglycemia likely caused by hypoglycemia for which I have started patient on D5 normal.  Cause of hypoglycemia are clear.  Patient does not take any diabetic medications.  Per patient's son patient has not been eating well for last few months.  And has been losing weight.  Will check C-peptide levels cortisol levels with hydroxybutyrate.  I also discussed with on-call neurologist who at this time advised to get MRI brain if her pacemaker is compatible otherwise to repeat CT head in 24 hours to  make sure there is no developing stroke.  Patient did pass stroke swallow. 2. Right lower extremity pain with a difficult to get pulses I ordered a CT angiogram of the lower extremities to make sure there is no obvious obstruction.  Based on the story we will have further plans.  Patient is on Xarelto for her known history of a flutter. 3. History of seizures on Keppra.  Patient's son is not aware of what is the dose she is taking presently a continued on the home dose.  Need to verify the dose.  No signs of any active seizure at this time. 4. History of atrial flutter and AV block status post pacemaker on Xarelto. 5. Hypertension we will  keep patient on as needed IV hydralazine until we verify home medications. 6. Thrombocytopenia appears to be chronic.  Follow CBC.   DVT prophylaxis: Xarelto. Code Status: Full code. Family Communication: Patient's son. Disposition Plan: To be determined. Consults called: Discussed with on-call neurologist. Admission status: Observation.   Eduard Clos MD [facility] Pager [phone].  If 7PM-7AM, please contact night-coverage [email or web address] Password [email or web address]  06/05/2020, 1:34 AM

## 2020-06-05 NOTE — ED Notes (Signed)
Pt given 4 oz orange juice for previous CBG reading

## 2020-06-05 NOTE — ED Notes (Signed)
PT given 110 ml of coke and crackers. MD Ghimire encouraging PO foods to maintain blood sugar.

## 2020-06-05 NOTE — ED Notes (Signed)
Per MD, CBGs to be drawn from ear instead of hands due to poor circulation to hands.

## 2020-06-05 NOTE — ED Notes (Signed)
Rn notified of lower blood sugar, pt instructed to drink more orange juice and pt took another large sip

## 2020-06-05 NOTE — ED Notes (Signed)
Pt blood sugar continues to drop, eben with PO and IV supplement. D50 administered per standing orders. RN notified Dr. Toniann Fail.

## 2020-06-06 ENCOUNTER — Other Ambulatory Visit: Payer: Self-pay

## 2020-06-06 LAB — C-PEPTIDE: C-Peptide: 6.6 ng/mL — ABNORMAL HIGH (ref 1.1–4.4)

## 2020-06-06 LAB — GLUCOSE, CAPILLARY
Glucose-Capillary: 90 mg/dL (ref 70–99)
Glucose-Capillary: 109 mg/dL — ABNORMAL HIGH (ref 70–99)
Glucose-Capillary: 104 mg/dL — ABNORMAL HIGH (ref 70–99)
Glucose-Capillary: 104 mg/dL — ABNORMAL HIGH (ref 70–99)
Glucose-Capillary: 105 mg/dL — ABNORMAL HIGH (ref 70–99)

## 2020-06-06 MED ORDER — ATORVASTATIN CALCIUM 40 MG PO TABS: 40.0000 mg | ORAL_TABLET | Freq: Every day | ORAL | Status: DC

## 2020-06-06 MED ORDER — AMLODIPINE BESYLATE 5 MG PO TABS: 5.0000 mg | ORAL_TABLET | Freq: Every day | ORAL | Status: DC

## 2020-06-06 MED ORDER — ENSURE ENLIVE PO LIQD
237.0000 mL | Freq: Two times a day (BID) | ORAL | Status: DC
  Administered 2020-06-06: 237 mL via ORAL

## 2020-06-06 MED ORDER — ADULT MULTIVITAMIN W/MINERALS CH
1.0000 | ORAL_TABLET | Freq: Every day | ORAL | Status: DC
  Administered 2020-06-06: 1 via ORAL
  Filled 2020-06-06: qty 1

## 2020-06-06 NOTE — Plan of Care (Signed)
Discussed with patient plan of care for this shift, pain management and some admission processes with some teach back displayed at this time

## 2020-06-06 NOTE — Progress Notes (Signed)
Initial Nutrition Assessment  DOCUMENTATION CODES:   Not applicable  INTERVENTION:   Recommend liberalizing diet to encourage adequate PO intake  Magic cup TID with meals, each supplement provides 290 kcal and 9 grams of protein  MVI daily  Continue Ensure Enlive po BID, each supplement provides 350 kcal and 20 grams of protein    NUTRITION DIAGNOSIS:   Inadequate oral intake related to lethargy/confusion as evidenced by per patient/family report.    GOAL:   Patient will meet greater than or equal to 90% of their needs    MONITOR:   PO intake, Supplement acceptance, Labs, Weight trends, I & O's  REASON FOR ASSESSMENT:   Malnutrition Screening Tool    ASSESSMENT:   Pt admitted with acute encephalopathy with hypoglycemia. PMH includes Afib, h/o symptomatic AV block s/p pacemaker placement, HTN, hypothyroidism, crest syndrome with pulmonary HTN, h/o seizures, dementia.  Pt unavailable at time of RD visit.   Pt noted to be a poor historian. Pt's husband also noted to be a poor historian who has cognitive decline.   Per H&P, pt's son reported that pt has been weak and has not been eating well and losing weight for the last 2 months.  Pt likely malnourished; however, cannot diagnose at this time without detailed diet history and/or nutrition-focused physical exam.   Reviewed wt history; no significant wt changes noted.   No PO intake documented. Pt with new orders for Ensure Enlive BID (pt will have first bottle at 1000 today).  Labs & medications reviewed.   NUTRITION - FOCUSED PHYSICAL EXAM:  Unable to perform at this time; will attempt at follow-up.   Diet Order:   Diet Order            Diet Heart Room service appropriate? Yes; Fluid consistency: Thin  Diet effective now                 EDUCATION NEEDS:   No education needs have been identified at this time  Skin:  Skin Assessment: Reviewed RN Assessment  Last BM:  PTA  Height:   Ht  Readings from Last 1 Encounters:  06/04/20 5\' 6"  (1.676 m)    Weight:   Wt Readings from Last 1 Encounters:  06/05/20 52.7 kg   BMI:  Body mass index is 18.75 kg/m.  Estimated Nutritional Needs:   Kcal:  1400-1600  Protein:  70-80 grams  Fluid:  >/= 1.4L/d    06/07/20, MS, RD, LDN RD pager number and weekend/on-call pager number located in Amion.

## 2020-06-06 NOTE — Progress Notes (Signed)
Discharge instructions reviewed with pt's son and husband.  Copy of instructions given to pt/son. No new scripts.  Pt d/c'd via wheelchair with belongings, with husband and son.            Escorted by unit staff.

## 2020-06-06 NOTE — Discharge Summary (Signed)
Physician Discharge Summary  Crystal PlumbLouise B Espinoza WUJ:[id number] DOB: 10/13/1929 DOA: 06/04/2020  PCP: Crystal Espinoza, Walter, MD  Admit date: 06/04/2020 Discharge date: 06/06/2020  Admitted From: Home Disposition: Home  Recommendations for Outpatient Follow-up:  1. Follow up with PCP in 1-2 weeks 2. Schedule follow-up with the neurologist.  Discharge Condition: Fair CODE STATUS: Full code Diet recommendation: Regular diet, soft food, more liquid and frequent eating.  Discharge summary: Patient has extensive cardiovascular problems including paroxysmal A. fib status post pacemaker currently on Xarelto, history of hypothyroidism.  Patient and her husband both with cognitive dysfunction.  Lives with close supervision of her son.  Patient also has crest syndrome and dysphagia.  Apparently for some time now she does not have good appetite and also suspect that she may not be taking her medications.  She was recently diagnosed with seizure. On the day of admit, her son went to see her and she was with slurred speech and complaining of leg pain and looked confused.  So she brought her to the ER.  In the emergency room she was afebrile.  Nonfocal neurological exam.  TSH 24.  Blood sugars were low, however this was false blood sugar monitoring from cyanotic fingers.  Serum blood sugars were normal.  Acute metabolic encephalopathy: Probable seizure, acute  stroke ruled out.  Initial CT scan and follow-up CT scan was without any evidence of acute changes.  Patient has high TSH indicating that she was not able to take her medications.  Keppra level was not sent on admission, however it is likely that she has not taking medications including Keppra and Synthroid.  Now patient has clinically improved, she was loaded with IV Synthroid and IV Keppra. Patient will go home today.  She will resume her medications including Synthroid and oral Keppra.  Detailed discussion with family and patient son about ensuring timely  medicine intake.  Seizure precautions.  Fall precautions. Patient has progressive crest syndrome and dysphagia, dietary modification and instructions given. Patient has claudication and has diffuse arterial disease including multiple areas of lower extremity arterial occlusions but no acute ischemic changes.  She is on Xarelto and statin.  Failure to thrive/progressive vascular disease/dysphagia/crest syndrome: Patient with progressive debility, dysphagia and poor health.  She will benefit with committee based palliative care follow-up.  We will do a referral.  Going home with son.  No changes in medication done as we suspect that her subtherapeutic levels were related to not taking medications.  Discharge Diagnoses:  Principal Problem:   Acute encephalopathy Active Problems:   CREST syndrome (HCC)   Paroxysmal atrial fibrillation (HCC)   Pacemaker   Seizure Crystal Espinoza(HCC)   Hypoglycemia   Encephalopathy    Discharge Instructions  Discharge Instructions    Amb Referral to Palliative Care   Complete by: As directed    Diet general   Complete by: As directed    Soft food, frequent meals with water and liquids.   Increase activity slowly   Complete by: As directed      Allergies as of 06/06/2020      Reactions   Levaquin [levofloxacin] Other (See Comments)   unknown      Medication List    TAKE these medications   amLODipine 5 MG tablet Commonly known as: NORVASC Take 1 tablet (5 mg total) by mouth daily. *NEEDS MD VISIT FOR FURTHER REFILLS-PLEASE CALL TO SCHEDULE.*   atorvastatin 40 MG tablet Commonly known as: LIPITOR Take 1 tablet (40 mg total) by mouth daily at  6 PM.   levETIRAcetam 250 MG tablet Commonly known as: KEPPRA Take 1 tablet (250 mg total) by mouth 2 (two) times daily.   Rivaroxaban 15 MG Tabs tablet Commonly known as: XARELTO Take 1 tablet (15 mg total) by mouth daily with supper.   Synthroid 100 MCG tablet Generic drug: levothyroxine Take 100 mcg by  mouth daily.       Follow-up Information    Crystal Brunette, MD Follow up in 2 week(s).   Specialty: Internal Medicine Contact information: [address] [place] [address] [phone]              Allergies  Allergen Reactions  . Levaquin [Levofloxacin] Other (See Comments)    unknown      Procedures/Studies: CT HEAD WO CONTRAST  Result Date: 06/05/2020 CLINICAL DATA:  Stroke. EXAM: CT HEAD WITHOUT CONTRAST TECHNIQUE: Contiguous axial images were obtained from the base of the skull through the vertex without intravenous contrast. COMPARISON:  06/04/2020 FINDINGS: Brain: Ventricle size and cerebral volume normal for age. Extensive hypodensity throughout the cerebral white matter bilaterally appears chronic and unchanged. Hypodensity in the thalamus bilaterally, most prominent the left. No change since 01/22/2020. Vascular: Negative for hyperdense vessel Skull: Negative Sinuses/Orbits: Mild mucosal edema sphenoid sinus. Remaining sinuses clear. Bilateral cataract extraction. No orbital mass Other: None IMPRESSION: Moderate to extensive chronic microvascular ischemic change stable from prior studies. No acute abnormality. Electronically Signed   By: Marlan Palau M.D.   On: 06/05/2020 13:39   CT Head Wo Contrast  Result Date: 06/04/2020 CLINICAL DATA:  Multiple symptoms including facial droop and expressive aphasia. EXAM: CT HEAD WITHOUT CONTRAST TECHNIQUE: Contiguous axial images were obtained from the base of the skull through the vertex without intravenous contrast. COMPARISON:  January 22, 2020 FINDINGS: Brain: No evidence of acute infarction, hemorrhage, hydrocephalus, extra-axial collection or mass lesion/mass effect. Atrophy and chronic microvascular ischemic changes as before. Vascular: No hyperdense vessel or unexpected calcification. Skull: Normal. Negative for fracture or focal lesion. Sinuses/Orbits: Secretions in LEFT sphenoid sinus. No acute process  related to the orbits. Other: None. IMPRESSION: 1. No acute intracranial abnormality. 2. Atrophy and chronic microvascular ischemic changes as before. 3. Mild sinus disease. Electronically Signed   By: Donzetta Kohut M.D.   On: 06/04/2020 17:14   CT ANGIO AO+BIFEM W & OR WO CONTRAST  Result Date: 06/05/2020 CLINICAL DATA:  Lower extremity arterial dissection EXAM: CT ANGIOGRAPHY OF ABDOMINAL AORTA WITH ILIOFEMORAL RUNOFF TECHNIQUE: Multidetector CT imaging of the abdomen, pelvis and lower extremities was performed using the standard protocol during bolus administration of intravenous contrast. Multiplanar CT image reconstructions and MIPs were obtained to evaluate the vascular anatomy. CONTRAST:  13mL OMNIPAQUE IOHEXOL 350 MG/ML SOLN COMPARISON:  None. FINDINGS: VASCULAR Aorta: Moderate aortoiliac atherosclerotic calcification. No evidence of intramural hematoma, aneurysm, or dissection. Celiac: Unremarkable SMA: Unremarkable Renals: Single renal arteries bilaterally. No evidence of hemodynamically significant stenosis. IMA: Patent at its origin, but segmentally occluded proximally. Superimposed moderate atherosclerotic plaque. RIGHT Lower Extremity Inflow: Widely patent. No evidence of aneurysm or dissection. Internal iliac artery is patent. Outflow: Widely patent. Extensive calcified plaque identified throughout the superficial femoral artery without evidence of focal hemodynamically significant stenosis. Runoff: Moderate atherosclerotic plaque within the popliteal artery without evidence of focal hemodynamically significant stenosis. There is a high-grade focal stenosis of the anterior tibial artery at its origin with diffuse disease seen throughout this vessel which terminates at the level of the a distal tibia. There is segmental occlusion of  the tibioperoneal trunk. There is rapid reconstitution of the peroneal and posterior tibial arteries which are heavily diseased throughout their course. The posterior  tibial artery continues to form the plantar arch. The peroneal artery reconstitutes the lateral calcaneal artery. LEFT Lower Extremity Inflow: Widely patent. No evidence of aneurysm or dissection. Internal iliac artery is patent. Outflow: Common femoral artery and profundus femoral artery are widely patent. There is extensive calcified atherosclerotic plaque throughout the superficial femoral artery resulting in segmental 50% stenoses within the mid SFA. Runoff: Extensive plaque within the popliteal artery with a serial 50% stenoses in its P1 and P2 segments and 50-75% stenosis in its P3 segment. High-grade focal stenosis of the anterior tibial artery at its origin. Evaluation of the infrapopliteal vasculature is limited by motion artifact. This vessel is heavily disease throughout and terminates at the level of the tibial plafond. Tibioperoneal trunk demonstrates a greater than 75% stenosis proximally. There is segmental occlusion of the posterior tibial and peroneal arteries at their origins. The peroneal artery is reconstituted and is heavily diseased, ultimately terminating at the level of the tibial plafond. The posterior tibial artery is heavily diseased and there is no definite reconstitution of this vessel. Veins: The veins are not opacified and are poorly assessed on this examination. Review of the MIP images confirms the above findings. NON-VASCULAR Lower chest: Mild global cardiomegaly. Pacemaker leads noted within the right heart. Extensive atherosclerotic calcification within the right coronary artery. Hepatobiliary: Status post cholecystectomy.  Liver unremarkable. Pancreas: Unremarkable Spleen: Unremarkable Adrenals/Urinary Tract: Adrenal glands and kidneys are unremarkable. Bladder is unremarkable. Stomach/Bowel: Moderate stool within the distal colon. The stomach and large and small bowel are otherwise unremarkable. Mild free fluid within the pelvis is nonspecific. No free intraperitoneal gas.  Lymphatic: No pathologic adenopathy within the abdomen and pelvis. Reproductive: Status post hysterectomy. No adnexal masses. Other: Rectum unremarkable Musculoskeletal: Advanced degenerative changes are seen within the lumbar spine, hips, and knees. Multiple bone infarcts are seen within the distal femurs bilaterally. IMPRESSION: 1. Short segment occlusion of the proximal inferior mesenteric artery. 2. Bilateral lower extremity runoff disease. On the right, there is single-vessel runoff to the foot. On the left, no definite in line vascular perfusion to the foot is identified. 3. Mild global cardiomegaly. Extensive atherosclerotic calcification within the right coronary artery. 4. Mild free fluid within the pelvis. This is nonspecific. 5. Advanced degenerative changes within the lumbar spine, hips, and knees. Multiple bone infarcts are seen within the distal femurs bilaterally. Aortic Atherosclerosis (ICD10-I70.0). Electronically Signed   By: Helyn Numbers MD   On: 06/05/2020 02:33    (Echo, Carotid, EGD, Colonoscopy, ERCP)    Subjective: Patient was seen and examined.  No overnight events.  Remains in a poor state.  She was having hard time to eat solid food and required a lot of encouragement to swallow.  No recurrence of episodes.  No more confusion or seizure.   Discharge Exam: Vitals:   06/06/20 0745 06/06/20 0814  BP: 114/75 123/65  Pulse: 60 (!) 59  Resp: 20 20  Temp: (!) 97.3 F (36.3 C) 97.6 F (36.4 C)  SpO2: 97% 97%   Vitals:   06/06/20 0235 06/06/20 0300 06/06/20 0745 06/06/20 0814  BP:   114/75 123/65  Pulse:   60 (!) 59  Resp: (!) 21 19 20 20   Temp:   (!) 97.3 F (36.3 C) 97.6 F (36.4 C)  TempSrc:   Oral   SpO2:   97% 97%  Weight:  Height:        General: Pt is alert, awake, not in acute distress Chronically sick looking.  Alert and oriented x2.  Thin and frail old lady. Cardiovascular: RRR, S1/S2 +, no rubs, no gallops, pacemaker left  precordium. Respiratory: CTA bilaterally, no wheezing, no rhonchi Abdominal: Soft, NT, ND, bowel sounds + Extremities: no edema, no cyanosis    The results of significant diagnostics from this hospitalization (including imaging, microbiology, ancillary and laboratory) are listed below for reference.     Microbiology: Recent Results (from the past 240 hour(s))  SARS Coronavirus 2 by RT PCR (hospital order, performed in [facility] hospital lab) Nasopharyngeal Nasopharyngeal Swab     Status: None   Collection Time: 06/04/20  5:27 PM   Specimen: Nasopharyngeal Swab  Result Value Ref Range Status   SARS Coronavirus 2 NEGATIVE NEGATIVE Final    Comment: (NOTE) SARS-CoV-2 target nucleic acids are NOT DETECTED.  The SARS-CoV-2 RNA is generally detectable in upper and lower respiratory specimens during the acute phase of infection. The lowest concentration of SARS-CoV-2 viral copies this assay can detect is 250 copies / mL. A negative result does not preclude SARS-CoV-2 infection and should not be used as the sole basis for treatment or other patient management decisions.  A negative result may occur with improper specimen collection / handling, submission of specimen other than nasopharyngeal swab, presence of viral mutation(s) within the areas targeted by this assay, and inadequate number of viral copies (<250 copies / mL). A negative result must be combined with clinical observations, patient history, and epidemiological information.  Fact Sheet for Patients:   BoilerBrush.com.cy  Fact Sheet for Healthcare Providers: https://pope.com/  This test is not yet approved or  cleared by the [place] FDA and has been authorized for detection and/or diagnosis of SARS-CoV-2 by FDA under an Emergency Use Authorization (EUA).  This EUA will remain in effect (meaning this test can be used) for the duration of the COVID-19 declaration under  Section 564(b)(1) of the Act, 21 U.S.C. section 360bbb-3(b)(1), unless the authorization is terminated or revoked sooner.  Performed at [facility], [address], [place], [place] [address]      Labs: BNP (last 3 results) No results for input(s): BNP in the last 8760 hours. Basic Metabolic Panel: Recent Labs  Lab 06/04/20 1545 06/05/20 0902  NA 136 139  K 4.1 3.9  CL 101 105  CO2 25 21*  GLUCOSE 114* 101*  BUN 11 6*  CREATININE 0.71 0.67  CALCIUM 9.3 9.0   Liver Function Tests: Recent Labs  Lab 06/04/20 1545  AST 21  ALT 11  ALKPHOS 106  BILITOT 1.5*  PROT 6.5  ALBUMIN 3.9   Recent Labs  Lab 06/04/20 1545  LIPASE 23   No results for input(s): AMMONIA in the last 168 hours. CBC: Recent Labs  Lab 06/04/20 1545 06/05/20 0902  WBC 6.0 4.8  NEUTROABS 4.9  --   HGB 13.5 13.3  HCT 42.2 42.2  MCV 106.8* 109.0*  PLT 115* 97*   Cardiac Enzymes: No results for input(s): CKTOTAL, CKMB, CKMBINDEX, TROPONINI in the last 168 hours. BNP: Invalid input(s): POCBNP CBG: Recent Labs  Lab 06/06/20 0108 06/06/20 0304 06/06/20 0545 06/06/20 0814 06/06/20 1131  GLUCAP 90 109* 104* 104* 105*   D-Dimer No results for input(s): DDIMER in the last 72 hours. Hgb A1c No results for input(s): HGBA1C in the last 72 hours. Lipid Profile No results for input(s): CHOL, HDL, LDLCALC,  TRIG, CHOLHDL, LDLDIRECT in the last 72 hours. Thyroid function studies Recent Labs    06/04/20 1545  TSH 24.336*   Anemia work up No results for input(s): VITAMINB12, FOLATE, FERRITIN, TIBC, IRON, RETICCTPCT in the last 72 hours. Urinalysis    Component Value Date/Time   COLORURINE YELLOW 06/04/2020 1610   APPEARANCEUR CLEAR 06/04/2020 1610   LABSPEC 1.010 06/04/2020 1610   PHURINE 7.0 06/04/2020 1610   GLUCOSEU >=500 (A) 06/04/2020 1610   HGBUR MODERATE (A) 06/04/2020 1610   BILIRUBINUR NEGATIVE 06/04/2020 1610   KETONESUR NEGATIVE 06/04/2020 1610   PROTEINUR 30 (A)  06/04/2020 1610   NITRITE NEGATIVE 06/04/2020 1610   LEUKOCYTESUR NEGATIVE 06/04/2020 1610   Sepsis Labs Invalid input(s): PROCALCITONIN,  WBC,  LACTICIDVEN Microbiology Recent Results (from the past 240 hour(s))  SARS Coronavirus 2 by RT PCR (hospital order, performed in [facility] hospital lab) Nasopharyngeal Nasopharyngeal Swab     Status: None   Collection Time: 06/04/20  5:27 PM   Specimen: Nasopharyngeal Swab  Result Value Ref Range Status   SARS Coronavirus 2 NEGATIVE NEGATIVE Final    Comment: (NOTE) SARS-CoV-2 target nucleic acids are NOT DETECTED.  The SARS-CoV-2 RNA is generally detectable in upper and lower respiratory specimens during the acute phase of infection. The lowest concentration of SARS-CoV-2 viral copies this assay can detect is 250 copies / mL. A negative result does not preclude SARS-CoV-2 infection and should not be used as the sole basis for treatment or other patient management decisions.  A negative result may occur with improper specimen collection / handling, submission of specimen other than nasopharyngeal swab, presence of viral mutation(s) within the areas targeted by this assay, and inadequate number of viral copies (<250 copies / mL). A negative result must be combined with clinical observations, patient history, and epidemiological information.  Fact Sheet for Patients:   BoilerBrush.com.cy  Fact Sheet for Healthcare Providers: https://pope.com/  This test is not yet approved or  cleared by the [place] FDA and has been authorized for detection and/or diagnosis of SARS-CoV-2 by FDA under an Emergency Use Authorization (EUA).  This EUA will remain in effect (meaning this test can be used) for the duration of the COVID-19 declaration under Section 564(b)(1) of the Act, 21 U.S.C. section 360bbb-3(b)(1), unless the authorization is terminated or revoked sooner.  Performed at [facility], [address] [place], [place] [address]      Time coordinating discharge:  35 minutes  SIGNED:   Dorcas Carrow, MD  [facility] 06/06/2020, 11:48 AM

## 2020-06-06 NOTE — Progress Notes (Signed)
Patent examiner [facility])  Hospital Liaison: RN note       Notified by [facility] manager of patient/family request for [facility] Palliative services at home after discharge.       Writer spoke with son Loraine Leriche to confirm interest and explain services.             [facility] Palliative team will follow up with patient after discharge.       Please call with any hospice or palliative related questions.       Thank you for this referral.       Yolande Jolly, RN, BSN [facility] Liaison (listed on AMION under [facility])   [phone]

## 2020-06-06 NOTE — TOC Initial Note (Signed)
Transition of Care [facility]) - Initial/Assessment Note    Patient Details  Name: Crystal Espinoza MRN: [id number] Date of Birth: 07-25-1929  Transition of Care [facility]) CM/SW Contact:    Lorri Frederick, LCSW Phone Number: 06/06/2020, 12:34 PM  Clinical Narrative:    CSW spoke with pt son Loraine Leriche regarding referral to palliative care.  Choice offered, Loraine Leriche interested in [employer].  Spoke with Efraim Kaufmann of [facility] who will contact family regarding appointment.  No equipment needs, per son.                 Expected Discharge Plan: Home/Self Care Barriers to Discharge: No Barriers Identified   Patient Goals and CMS Choice   CMS Medicare.gov Compare Post Acute Care list provided to:: Patient Represenative (must comment) Choice offered to / list presented to : Adult Children  Expected Discharge Plan and Services Expected Discharge Plan: Home/Self Care     Post Acute Care Choice:  (palliative care-[facility]) Living arrangements for the past 2 months: Single Family Home Expected Discharge Date: 06/06/20               DME Arranged: N/A           HH Agency: [facility] Date HH Agency Contacted: 06/06/20 Time HH Agency Contacted: 1232 Representative spoke with at [facility] Agency: Melissa  Prior Living Arrangements/Services Living arrangements for the past 2 months: Single Family Home Lives with:: Spouse Patient language and need for interpreter reviewed:: Yes Do you feel safe going back to the place where you live?: Yes      Need for Family Participation in Patient Care: Yes (Comment) Care giver support system in place?: Yes (comment)   Criminal Activity/Legal Involvement Pertinent to Current Situation/Hospitalization: No - Comment as needed  Activities of Daily Living Home Assistive Devices/Equipment: Bedside commode/3-in-1, Walker (specify type) ADL Screening (condition at time of admission) Patient's cognitive ability adequate to safely complete  daily activities?: No Is the patient deaf or have difficulty hearing?: No Does the patient have difficulty seeing, even when wearing glasses/contacts?: No Does the patient have difficulty concentrating, remembering, or making decisions?: Yes Patient able to express need for assistance with ADLs?: No Does the patient have difficulty dressing or bathing?: Yes Independently performs ADLs?: No Communication: Appropriate for developmental age Dressing (OT): Needs assistance Is this a change from baseline?: Pre-admission baseline Grooming: Needs assistance Is this a change from baseline?: Pre-admission baseline Feeding: Needs assistance Is this a change from baseline?: Pre-admission baseline Bathing: Needs assistance Is this a change from baseline?: Pre-admission baseline Toileting: Needs assistance Is this a change from baseline?: Pre-admission baseline In/Out Bed: Needs assistance Is this a change from baseline?: Pre-admission baseline Walks in Home: Needs assistance Is this a change from baseline?: Pre-admission baseline Does the patient have difficulty walking or climbing stairs?: Yes Weakness of Legs: Both Weakness of Arms/Hands: Both  Permission Sought/Granted Permission sought to share information with : Facility Medical sales representative, Family Supports Permission granted to share information with : Yes, Verbal Permission Granted  Share Information with NAME: Loraine Leriche, son           Emotional Assessment Appearance:: Appears stated age Attitude/Demeanor/Rapport: Unable to Assess Affect (typically observed): Unable to Assess Orientation: : Oriented to Self Alcohol / Substance Use: Not Applicable Psych Involvement: No (comment)  Admission diagnosis:  Hypoglycemia [E16.2] Imbalance [R26.89] Encephalopathy [G93.40] Elevated TSH [R79.89] Acute encephalopathy [G93.40] Spell of change in speech [R47.89] Patient Active Problem List   Diagnosis Date Noted  .  Hypoglycemia  06/05/2020  . Encephalopathy 06/05/2020  . Acute encephalopathy 06/04/2020  . Seizure (HCC)   . CVA (cerebral vascular accident) (HCC) 01/20/2020  . AMS (altered mental status) 01/20/2020  . SOB (shortness of breath)   . Pacemaker 06/30/2019  . Symptomatic bradycardia 04/08/2017  . Paroxysmal atrial fibrillation (HCC) 02/02/2017  . RBBB 02/27/2014  . PAC (premature atrial contraction) 02/27/2014  . CREST syndrome (HCC) 02/27/2014   PCP:  Merri Brunette, MD Pharmacy:   [facility] [phone] [place], [place] - [phone] AT [facility] [address] [place] [address] Phone: [phone] Fax: [phone]     Social Determinants of Health (SDOH) Interventions    Readmission Risk Interventions No flowsheet data found.

## 2020-06-14 ENCOUNTER — Telehealth: Payer: Self-pay | Admitting: Internal Medicine

## 2020-06-14 LAB — SULFONYLUREA HYPOGLYCEMICS PANEL, SERUM
Acetohexamide: NEGATIVE ug/mL (ref 20–60)
Chlorpropamide: NEGATIVE ug/mL (ref 75–250)
Glimepiride: NEGATIVE ng/mL (ref 80–250)
Glipizide: NEGATIVE ng/mL (ref 200–1000)
Glyburide: NEGATIVE ng/mL
Nateglinide: NEGATIVE ng/mL
Repaglinide: NEGATIVE ng/mL
Tolazamide: NEGATIVE ug/mL
Tolbutamide: NEGATIVE ug/mL (ref 40–100)

## 2020-06-14 NOTE — Telephone Encounter (Signed)
Spoke with patient's son, Loraine Leriche, regarding the Palliative referral and all questions were answered and he was in agreement with scheduling a visit.  I have scheduled an In-person Consult for 06/30/20 @ 12 Noon.

## 2020-06-29 ENCOUNTER — Telehealth: Payer: Self-pay | Admitting: Internal Medicine

## 2020-06-29 NOTE — Telephone Encounter (Signed)
Rec'd call from patient's son Crystal Espinoza, and he wanted to cancel Palliative services at this time.  Son stated that if they decided to pursue Palliative in the future that he would call us back.  Will notify MD and Palliative Team.

## 2020-06-30 ENCOUNTER — Other Ambulatory Visit: Payer: Medicare Other | Admitting: Internal Medicine

## 2020-07-06 ENCOUNTER — Telehealth: Payer: Self-pay | Admitting: Internal Medicine

## 2020-07-06 NOTE — Telephone Encounter (Signed)
I rec'd a call back from Dr. Carolee Rota nurse and she said that Dr. Renne Crigler was in agreement with starting Palliative services with patient.

## 2020-07-06 NOTE — Telephone Encounter (Signed)
Rec'd call from son, Loraine Leriche, and he wanted to schedule a visit with our Palliative NP for patient.  I scheduled an In-person Consult for 07/11/20 @ 10 AM.  I contacted Dr. Carolee Rota office to let them know that the son wants to pursue Palliative services for patient and I needed to make sure that Dr. Renne Crigler was still okay with [place] going out.  Receptionist sent message back regarding this.

## 2020-07-07 ENCOUNTER — Other Ambulatory Visit: Payer: Self-pay

## 2020-07-07 DIAGNOSIS — Z20822 Contact with and (suspected) exposure to covid-19: Secondary | ICD-10-CM | POA: Diagnosis not present

## 2020-07-09 LAB — NOVEL CORONAVIRUS, NAA: SARS-CoV-2, NAA: NOT DETECTED

## 2020-07-09 LAB — SARS-COV-2, NAA 2 DAY TAT

## 2020-07-09 LAB — SPECIMEN STATUS REPORT

## 2020-07-10 ENCOUNTER — Telehealth: Payer: Self-pay | Admitting: Internal Medicine

## 2020-07-10 NOTE — Telephone Encounter (Signed)
Rec'd call from patient's son wanting to cancel the Palliative Consult scheduled for 07/11/20.  Son stated that patient's husband is in the hospital with COVID and patient was tested on Friday but hasn't gotten results back yet.  Told son that I would speak with NP to see when we needed to reschedule the Consult and that I would call him back.

## 2020-07-11 ENCOUNTER — Other Ambulatory Visit: Payer: Medicare Other | Admitting: Internal Medicine

## 2020-07-11 ENCOUNTER — Telehealth: Payer: Self-pay | Admitting: Internal Medicine

## 2020-07-11 NOTE — Telephone Encounter (Signed)
Spoke with son Loraine Leriche, and gave him some dates that the NP could see patient and we have rescheduled the In-person Consult for 07/24/20 @ 11:30 AM.

## 2020-07-24 ENCOUNTER — Other Ambulatory Visit: Payer: Self-pay

## 2020-07-24 ENCOUNTER — Other Ambulatory Visit: Payer: Medicare Other | Admitting: Internal Medicine

## 2020-08-01 DIAGNOSIS — R600 Localized edema: Secondary | ICD-10-CM | POA: Diagnosis not present

## 2020-08-01 DIAGNOSIS — E039 Hypothyroidism, unspecified: Secondary | ICD-10-CM | POA: Diagnosis not present

## 2020-08-01 DIAGNOSIS — Z23 Encounter for immunization: Secondary | ICD-10-CM | POA: Diagnosis not present

## 2020-08-01 DIAGNOSIS — R54 Age-related physical debility: Secondary | ICD-10-CM | POA: Diagnosis not present

## 2020-08-01 DIAGNOSIS — I517 Cardiomegaly: Secondary | ICD-10-CM | POA: Diagnosis not present

## 2020-08-07 ENCOUNTER — Other Ambulatory Visit: Payer: Self-pay

## 2020-08-07 ENCOUNTER — Other Ambulatory Visit: Payer: Medicare Other | Admitting: Internal Medicine

## 2020-08-07 DIAGNOSIS — Z515 Encounter for palliative care: Secondary | ICD-10-CM

## 2020-08-07 DIAGNOSIS — R609 Edema, unspecified: Secondary | ICD-10-CM

## 2020-08-07 DIAGNOSIS — I480 Paroxysmal atrial fibrillation: Secondary | ICD-10-CM | POA: Diagnosis not present

## 2020-08-07 NOTE — Progress Notes (Signed)
Therapist, nutritional Palliative Care Consult Note Telephone: [phone]  Fax: [phone]  PATIENT NAME: Crystal Espinoza DOB: 08-17-1929 MRN: [id number]  PRIMARY CARE PROVIDER:   Merri Brunette, MD  REFERRING PROVIDER:  Merri Brunette, MD [address] [place],  [place] [address]  RESPONSIBLE PARTY:  Dr. Merri Espinoza       RECOMMENDATIONS and PLAN:  Palliative care encounter  Z51.5  1.  Advance care planning:  Explanation of palliative and hospice care services.  Reviewed goals of care which are inclusive of MOST delegations:  DNAR, DNI, limited additional interventions, antibiotics if indicated, and IV fluid on a trial basis.  No feeding tube desired.   Her goals are to improve strength, decrease edema of legs and resume ambulation with walker.  She plans to continue living at her home with the assistance of her family.  snf   2.  Peripheral edema with tissue weeping  Keep legs clean and dry.  Compression hose and leg elevation.  Limit sodium use. Encouraged to sleep in bed rather than upright in a chair with legs dependent. Consider low dose diuretic if normal renal function on recent lab collection.   3.  Debilitation:  Recent caregiver duties for ill husband who expired.  Balanced meals and adequate hydration.  Son will investigate if pt is eligible to homecare giver From the veteran's administrations.  (Contact information  Provided to son)  4.  Altered gait.  Related to peripheral edema and debilitaion.  Begin home phycial therapy as planned.  Monitor weight. Fall risk prevention encouraged..  I spent 60 minutes providing this consultation,  From 1300 to 1400 More than 50% of the time in this consultation was spent coordinating communication.   HISTORY OF PRESENT ILLNESS:  Crystal Espinoza is a 86 y.o. year old female with multiple medical problems including  debilitation, seizure disorder, atrial fib and Crest syndrome. Palliative Care was  asked to help address goals of care.   CODE STATUS: DNAR/DNI  PPS: 40% weak     Decreased from 50-60% in June 2021   HOSPICE ELIGIBILITY/DIAGNOSIS: TBD  PAST MEDICAL HISTORY:  Past Medical History:  Diagnosis Date  . Cataract   . CREST syndrome (HCC)   . Hypertension   . Nausea   . Paroxysmal atrial fibrillation (HCC) 02/02/2017  . Severe pulmonary hypertension (HCC)    on cath 02/2017  . Symptomatic bradycardia 04/08/2017    SOCIAL HX:  Social History   Tobacco Use  . Smoking status: Never Smoker  . Smokeless tobacco: Never Used  Substance Use Topics  . Alcohol use: No    ALLERGIES:  Allergies  Allergen Reactions  . Levaquin [Levofloxacin] Other (See Comments)    unknown     PERTINENT MEDICATIONS:  Outpatient Encounter Medications as of 08/07/2020  Medication Sig  . amLODipine (NORVASC) 5 MG tablet Take 1 tablet (5 mg total) by mouth daily. *NEEDS MD VISIT FOR FURTHER REFILLS-PLEASE CALL TO SCHEDULE.*  . levothyroxine (SYNTHROID) 100 MCG tablet Take 100 mcg by mouth daily.   Marland Kitchen atorvastatin (LIPITOR) 40 MG tablet Take 1 tablet (40 mg total) by mouth daily at 6 PM.  . levETIRAcetam (KEPPRA) 250 MG tablet Take 1 tablet (250 mg total) by mouth 2 (two) times daily.  . Rivaroxaban (XARELTO) 15 MG TABS tablet Take 1 tablet (15 mg total) by mouth daily with supper. (Patient not taking: Reported on 08/07/2020)   No facility-administered encounter medications on file as of 08/07/2020.  PHYSICAL EXAM:   General: NAD, chronically ill and frail appearing, thin Cardiovascular: regular rate and rhythm Pulmonary:fine rales of bilat lung bases ( 02 at 96% on rm air) Abdomen: soft, nontender, + bowel sounds Extremities:2+ edema with crusted serous dis Skin: scattered ecchymos of BUE, cyanotic hue of fingertips Neurological: Alert and oriented x 3, Weakness  Margaretha Sheffield, NP-C

## 2020-08-08 ENCOUNTER — Ambulatory Visit (INDEPENDENT_AMBULATORY_CARE_PROVIDER_SITE_OTHER): Payer: Medicare Other

## 2020-08-08 DIAGNOSIS — I480 Paroxysmal atrial fibrillation: Secondary | ICD-10-CM | POA: Diagnosis not present

## 2020-08-08 DIAGNOSIS — R600 Localized edema: Secondary | ICD-10-CM | POA: Diagnosis not present

## 2020-08-08 DIAGNOSIS — R001 Bradycardia, unspecified: Secondary | ICD-10-CM

## 2020-08-08 DIAGNOSIS — D539 Nutritional anemia, unspecified: Secondary | ICD-10-CM | POA: Diagnosis not present

## 2020-08-08 DIAGNOSIS — R749 Abnormal serum enzyme level, unspecified: Secondary | ICD-10-CM | POA: Diagnosis not present

## 2020-08-08 DIAGNOSIS — I10 Essential (primary) hypertension: Secondary | ICD-10-CM | POA: Diagnosis not present

## 2020-08-08 LAB — CUP PACEART REMOTE DEVICE CHECK
Battery Remaining Longevity: 119 mo
Battery Remaining Percentage: 95.5 %
Battery Voltage: 3.01 V
Brady Statistic RV Percent Paced: 83 %
Date Time Interrogation Session: 20211026002649
Implantable Lead Implant Date: 20180626
Implantable Lead Implant Date: 20180626
Implantable Lead Location: 753859
Implantable Lead Location: 753860
Implantable Lead Model: 1999
Implantable Pulse Generator Implant Date: 20180626
Lead Channel Impedance Value: 400 Ohm
Lead Channel Pacing Threshold Amplitude: 0.5 V
Lead Channel Pacing Threshold Pulse Width: 0.5 ms
Lead Channel Sensing Intrinsic Amplitude: 7 mV
Lead Channel Setting Pacing Amplitude: 2.5 V
Lead Channel Setting Pacing Pulse Width: 0.5 ms
Lead Channel Setting Sensing Sensitivity: 5 mV
Pulse Gen Model: 2272
Pulse Gen Serial Number: 8918641

## 2020-08-14 NOTE — Progress Notes (Signed)
Remote pacemaker transmission.   

## 2020-08-15 ENCOUNTER — Other Ambulatory Visit: Payer: Self-pay

## 2020-08-15 ENCOUNTER — Emergency Department (HOSPITAL_COMMUNITY): Payer: Medicare Other

## 2020-08-15 ENCOUNTER — Inpatient Hospital Stay (HOSPITAL_COMMUNITY)
Admission: EM | Admit: 2020-08-15 | Discharge: 2020-08-21 | DRG: 871 | Disposition: A | Discharge status: Skilled Nursing Facility | Payer: Medicare Other | Attending: Internal Medicine | Admitting: Internal Medicine

## 2020-08-15 ENCOUNTER — Encounter (HOSPITAL_COMMUNITY): Payer: Self-pay | Admitting: Family Medicine

## 2020-08-15 DIAGNOSIS — K2970 Gastritis, unspecified, without bleeding: Secondary | ICD-10-CM | POA: Diagnosis not present

## 2020-08-15 DIAGNOSIS — E871 Hypo-osmolality and hyponatremia: Secondary | ICD-10-CM | POA: Diagnosis not present

## 2020-08-15 DIAGNOSIS — F0390 Unspecified dementia without behavioral disturbance: Secondary | ICD-10-CM | POA: Diagnosis present

## 2020-08-15 DIAGNOSIS — R569 Unspecified convulsions: Secondary | ICD-10-CM

## 2020-08-15 DIAGNOSIS — Z8673 Personal history of transient ischemic attack (TIA), and cerebral infarction without residual deficits: Secondary | ICD-10-CM

## 2020-08-15 DIAGNOSIS — I872 Venous insufficiency (chronic) (peripheral): Secondary | ICD-10-CM | POA: Diagnosis not present

## 2020-08-15 DIAGNOSIS — Z7189 Other specified counseling: Secondary | ICD-10-CM

## 2020-08-15 DIAGNOSIS — R8271 Bacteriuria: Secondary | ICD-10-CM | POA: Diagnosis not present

## 2020-08-15 DIAGNOSIS — J323 Chronic sphenoidal sinusitis: Secondary | ICD-10-CM | POA: Diagnosis not present

## 2020-08-15 DIAGNOSIS — J189 Pneumonia, unspecified organism: Secondary | ICD-10-CM | POA: Diagnosis present

## 2020-08-15 DIAGNOSIS — I272 Pulmonary hypertension, unspecified: Secondary | ICD-10-CM | POA: Diagnosis present

## 2020-08-15 DIAGNOSIS — A419 Sepsis, unspecified organism: Principal | ICD-10-CM | POA: Diagnosis present

## 2020-08-15 DIAGNOSIS — Z79899 Other long term (current) drug therapy: Secondary | ICD-10-CM

## 2020-08-15 DIAGNOSIS — M6281 Muscle weakness (generalized): Secondary | ICD-10-CM | POA: Diagnosis not present

## 2020-08-15 DIAGNOSIS — Z90710 Acquired absence of both cervix and uterus: Secondary | ICD-10-CM

## 2020-08-15 DIAGNOSIS — R6520 Severe sepsis without septic shock: Secondary | ICD-10-CM | POA: Diagnosis not present

## 2020-08-15 DIAGNOSIS — B9629 Other Escherichia coli [E. coli] as the cause of diseases classified elsewhere: Secondary | ICD-10-CM | POA: Diagnosis present

## 2020-08-15 DIAGNOSIS — I499 Cardiac arrhythmia, unspecified: Secondary | ICD-10-CM | POA: Diagnosis not present

## 2020-08-15 DIAGNOSIS — E162 Hypoglycemia, unspecified: Secondary | ICD-10-CM | POA: Diagnosis present

## 2020-08-15 DIAGNOSIS — Z66 Do not resuscitate: Secondary | ICD-10-CM | POA: Diagnosis not present

## 2020-08-15 DIAGNOSIS — Z743 Need for continuous supervision: Secondary | ICD-10-CM | POA: Diagnosis not present

## 2020-08-15 DIAGNOSIS — I517 Cardiomegaly: Secondary | ICD-10-CM | POA: Diagnosis not present

## 2020-08-15 DIAGNOSIS — D509 Iron deficiency anemia, unspecified: Secondary | ICD-10-CM | POA: Diagnosis not present

## 2020-08-15 DIAGNOSIS — Z8249 Family history of ischemic heart disease and other diseases of the circulatory system: Secondary | ICD-10-CM

## 2020-08-15 DIAGNOSIS — D696 Thrombocytopenia, unspecified: Secondary | ICD-10-CM | POA: Diagnosis present

## 2020-08-15 DIAGNOSIS — I10 Essential (primary) hypertension: Secondary | ICD-10-CM | POA: Diagnosis not present

## 2020-08-15 DIAGNOSIS — E039 Hypothyroidism, unspecified: Secondary | ICD-10-CM | POA: Diagnosis not present

## 2020-08-15 DIAGNOSIS — Z20822 Contact with and (suspected) exposure to covid-19: Secondary | ICD-10-CM | POA: Diagnosis present

## 2020-08-15 DIAGNOSIS — E538 Deficiency of other specified B group vitamins: Secondary | ICD-10-CM | POA: Diagnosis not present

## 2020-08-15 DIAGNOSIS — I480 Paroxysmal atrial fibrillation: Secondary | ICD-10-CM | POA: Diagnosis not present

## 2020-08-15 DIAGNOSIS — Z515 Encounter for palliative care: Secondary | ICD-10-CM

## 2020-08-15 DIAGNOSIS — G9341 Metabolic encephalopathy: Secondary | ICD-10-CM | POA: Diagnosis not present

## 2020-08-15 DIAGNOSIS — R404 Transient alteration of awareness: Secondary | ICD-10-CM | POA: Diagnosis not present

## 2020-08-15 DIAGNOSIS — G9340 Encephalopathy, unspecified: Secondary | ICD-10-CM | POA: Diagnosis present

## 2020-08-15 DIAGNOSIS — I5022 Chronic systolic (congestive) heart failure: Secondary | ICD-10-CM | POA: Diagnosis present

## 2020-08-15 DIAGNOSIS — Z7901 Long term (current) use of anticoagulants: Secondary | ICD-10-CM | POA: Diagnosis not present

## 2020-08-15 DIAGNOSIS — Z881 Allergy status to other antibiotic agents status: Secondary | ICD-10-CM

## 2020-08-15 DIAGNOSIS — Z7401 Bed confinement status: Secondary | ICD-10-CM | POA: Diagnosis not present

## 2020-08-15 DIAGNOSIS — R1312 Dysphagia, oropharyngeal phase: Secondary | ICD-10-CM | POA: Diagnosis not present

## 2020-08-15 DIAGNOSIS — R531 Weakness: Secondary | ICD-10-CM | POA: Diagnosis not present

## 2020-08-15 DIAGNOSIS — G40909 Epilepsy, unspecified, not intractable, without status epilepticus: Secondary | ICD-10-CM | POA: Diagnosis present

## 2020-08-15 DIAGNOSIS — Z79890 Hormone replacement therapy (postmenopausal): Secondary | ICD-10-CM

## 2020-08-15 DIAGNOSIS — I639 Cerebral infarction, unspecified: Secondary | ICD-10-CM | POA: Diagnosis not present

## 2020-08-15 DIAGNOSIS — D72819 Decreased white blood cell count, unspecified: Secondary | ICD-10-CM | POA: Diagnosis present

## 2020-08-15 DIAGNOSIS — I110 Hypertensive heart disease with heart failure: Secondary | ICD-10-CM | POA: Diagnosis present

## 2020-08-15 DIAGNOSIS — R509 Fever, unspecified: Secondary | ICD-10-CM | POA: Diagnosis present

## 2020-08-15 DIAGNOSIS — M2550 Pain in unspecified joint: Secondary | ICD-10-CM | POA: Diagnosis not present

## 2020-08-15 DIAGNOSIS — Z5320 Procedure and treatment not carried out because of patient's decision for unspecified reasons: Secondary | ICD-10-CM | POA: Diagnosis not present

## 2020-08-15 DIAGNOSIS — R410 Disorientation, unspecified: Secondary | ICD-10-CM | POA: Diagnosis not present

## 2020-08-15 DIAGNOSIS — Z950 Presence of cardiac pacemaker: Secondary | ICD-10-CM

## 2020-08-15 DIAGNOSIS — R109 Unspecified abdominal pain: Secondary | ICD-10-CM | POA: Diagnosis not present

## 2020-08-15 DIAGNOSIS — J0130 Acute sphenoidal sinusitis, unspecified: Secondary | ICD-10-CM | POA: Diagnosis not present

## 2020-08-15 DIAGNOSIS — J811 Chronic pulmonary edema: Secondary | ICD-10-CM | POA: Diagnosis not present

## 2020-08-15 DIAGNOSIS — D638 Anemia in other chronic diseases classified elsewhere: Secondary | ICD-10-CM | POA: Diagnosis present

## 2020-08-15 DIAGNOSIS — D518 Other vitamin B12 deficiency anemias: Secondary | ICD-10-CM | POA: Diagnosis not present

## 2020-08-15 DIAGNOSIS — R001 Bradycardia, unspecified: Secondary | ICD-10-CM | POA: Diagnosis not present

## 2020-08-15 LAB — COMPREHENSIVE METABOLIC PANEL
ALT: 10 U/L (ref 0–44)
AST: 14 U/L — ABNORMAL LOW (ref 15–41)
Albumin: 3.7 g/dL (ref 3.5–5.0)
Alkaline Phosphatase: 123 U/L (ref 38–126)
Anion gap: 9 (ref 5–15)
BUN: 13 mg/dL (ref 8–23)
CO2: 23 mmol/L (ref 22–32)
Calcium: 8.8 mg/dL — ABNORMAL LOW (ref 8.9–10.3)
Chloride: 101 mmol/L (ref 98–111)
Creatinine, Ser: 0.64 mg/dL (ref 0.44–1.00)
GFR, Estimated: >60 mL/min (ref >60)
Glucose, Bld: 125 mg/dL — ABNORMAL HIGH (ref 70–99)
Potassium: 4.3 mmol/L (ref 3.5–5.1)
Sodium: 133 mmol/L — ABNORMAL LOW (ref 135–145)
Total Bilirubin: 1.2 mg/dL (ref 0.3–1.2)
Total Protein: 7.1 g/dL (ref 6.5–8.1)

## 2020-08-15 LAB — I-STAT CHEM 8, ED
BUN: 12 mg/dL (ref 8–23)
Calcium, Ion: 1.2 mmol/L (ref 1.15–1.40)
Chloride: 99 mmol/L (ref 98–111)
Creatinine, Ser: 0.7 mg/dL (ref 0.44–1.00)
Glucose, Bld: 122 mg/dL — ABNORMAL HIGH (ref 70–99)
HCT: 35 % — ABNORMAL LOW (ref 36.0–46.0)
Hemoglobin: 11.9 g/dL — ABNORMAL LOW (ref 12.0–15.0)
Potassium: 4.1 mmol/L (ref 3.5–5.1)
Sodium: 133 mmol/L — ABNORMAL LOW (ref 135–145)
TCO2: 24 mmol/L (ref 22–32)

## 2020-08-15 LAB — LACTIC ACID, PLASMA
Lactic Acid, Venous: 2.4 mmol/L (ref 0.5–1.9)
Lactic Acid, Venous: 3.9 mmol/L (ref 0.5–1.9)

## 2020-08-15 LAB — URINALYSIS, ROUTINE W REFLEX MICROSCOPIC
Bilirubin Urine: NEGATIVE
Glucose, UA: NEGATIVE mg/dL
Ketones, ur: NEGATIVE mg/dL
Leukocytes,Ua: NEGATIVE
Nitrite: NEGATIVE
Protein, ur: NEGATIVE mg/dL
Specific Gravity, Urine: 1.01 (ref 1.005–1.030)
pH: 7 (ref 5.0–8.0)

## 2020-08-15 LAB — RESPIRATORY PANEL BY RT PCR (FLU A&B, COVID)
Influenza A by PCR: NEGATIVE
Influenza B by PCR: NEGATIVE
SARS Coronavirus 2 by RT PCR: NEGATIVE

## 2020-08-15 LAB — CBC WITH DIFFERENTIAL/PLATELET
Abs Immature Granulocytes: 0.03 10*3/uL (ref 0.00–0.07)
Basophils Absolute: 0 10*3/uL (ref 0.0–0.1)
Basophils Relative: 0 %
Eosinophils Absolute: 0 10*3/uL (ref 0.0–0.5)
Eosinophils Relative: 0 %
HCT: 36.4 % (ref 36.0–46.0)
Hemoglobin: 11.7 g/dL — ABNORMAL LOW (ref 12.0–15.0)
Immature Granulocytes: 1 %
Lymphocytes Relative: 10 %
Lymphs Abs: 0.6 10*3/uL — ABNORMAL LOW (ref 0.7–4.0)
MCH: 34.6 pg — ABNORMAL HIGH (ref 26.0–34.0)
MCHC: 32.1 g/dL (ref 30.0–36.0)
MCV: 107.7 fL — ABNORMAL HIGH (ref 80.0–100.0)
Monocytes Absolute: 0.3 10*3/uL (ref 0.1–1.0)
Monocytes Relative: 6 %
Neutro Abs: 4.6 10*3/uL (ref 1.7–7.7)
Neutrophils Relative %: 83 %
Platelets: 166 10*3/uL (ref 150–400)
RBC: 3.38 MIL/uL — ABNORMAL LOW (ref 3.87–5.11)
RDW: 17.2 % — ABNORMAL HIGH (ref 11.5–15.5)
WBC: 5.5 10*3/uL (ref 4.0–10.5)
nRBC: 0 % (ref 0.0–0.2)

## 2020-08-15 LAB — PROTIME-INR
INR: 1.1 (ref 0.8–1.2)
Prothrombin Time: 13.9 seconds (ref 11.4–15.2)

## 2020-08-15 LAB — CBG MONITORING, ED
Glucose-Capillary: 34 mg/dL — CL (ref 70–99)
Glucose-Capillary: 98 mg/dL (ref 70–99)
Glucose-Capillary: <10 mg/dL — CL (ref 70–99)
Glucose-Capillary: 99 mg/dL (ref 70–99)

## 2020-08-15 LAB — APTT: aPTT: 25 seconds (ref 24–36)

## 2020-08-15 MED ORDER — LEVOTHYROXINE SODIUM 100 MCG PO TABS
100.0000 ug | ORAL_TABLET | Freq: Every day | ORAL | Status: DC
  Administered 2020-08-16: 100 ug via ORAL
  Filled 2020-08-15 (×2): qty 1

## 2020-08-15 MED ORDER — ACETAMINOPHEN 325 MG PO TABS
650.0000 mg | ORAL_TABLET | Freq: Four times a day (QID) | ORAL | Status: DC | PRN
  Administered 2020-08-20: 650 mg via ORAL
  Filled 2020-08-15: qty 2

## 2020-08-15 MED ORDER — IOHEXOL 300 MG/ML  SOLN
100.0000 mL | Freq: Once | INTRAMUSCULAR | Status: AC | PRN
  Administered 2020-08-15: 100 mL via INTRAVENOUS

## 2020-08-15 MED ORDER — DEXTROSE 50 % IV SOLN
INTRAVENOUS | Status: AC
  Filled 2020-08-15: qty 50

## 2020-08-15 MED ORDER — RIVAROXABAN 15 MG PO TABS: 15.0000 mg | ORAL_TABLET | Freq: Every day | ORAL | Status: DC

## 2020-08-15 MED ORDER — PANTOPRAZOLE SODIUM 40 MG IV SOLR
40.0000 mg | Freq: Two times a day (BID) | INTRAVENOUS | Status: DC
  Administered 2020-08-15 – 2020-08-17 (×4): 40 mg via INTRAVENOUS
  Filled 2020-08-15 (×4): qty 40

## 2020-08-15 MED ORDER — VANCOMYCIN HCL IN DEXTROSE 1-5 GM/200ML-% IV SOLN
1000.0000 mg | Freq: Once | INTRAVENOUS | Status: AC
  Administered 2020-08-15: 1000 mg via INTRAVENOUS
  Filled 2020-08-15: qty 200

## 2020-08-15 MED ORDER — SODIUM CHLORIDE 0.9 % IV SOLN
2.0000 g | Freq: Once | INTRAVENOUS | Status: AC
  Administered 2020-08-15: 2 g via INTRAVENOUS
  Filled 2020-08-15: qty 2

## 2020-08-15 MED ORDER — ACETAMINOPHEN 650 MG RE SUPP: 650.0000 mg | Freq: Four times a day (QID) | RECTAL | Status: DC | PRN

## 2020-08-15 MED ORDER — LEVETIRACETAM 250 MG PO TABS
250.0000 mg | ORAL_TABLET | Freq: Two times a day (BID) | ORAL | Status: DC
  Administered 2020-08-15 – 2020-08-16 (×2): 250 mg via ORAL
  Filled 2020-08-15 (×2): qty 1

## 2020-08-15 MED ORDER — ONDANSETRON HCL 4 MG PO TABS: 4.0000 mg | ORAL_TABLET | Freq: Four times a day (QID) | ORAL | Status: DC | PRN

## 2020-08-15 MED ORDER — SODIUM CHLORIDE 0.9 % IV SOLN
2.0000 g | Freq: Two times a day (BID) | INTRAVENOUS | Status: DC
  Administered 2020-08-16 – 2020-08-17 (×3): 2 g via INTRAVENOUS
  Filled 2020-08-15 (×3): qty 2

## 2020-08-15 MED ORDER — ONDANSETRON HCL 4 MG/2ML IJ SOLN
4.0000 mg | Freq: Four times a day (QID) | INTRAMUSCULAR | Status: DC | PRN
  Administered 2020-08-19: 4 mg via INTRAVENOUS
  Filled 2020-08-15 (×2): qty 2

## 2020-08-15 MED ORDER — VANCOMYCIN HCL 750 MG/150ML IV SOLN
750.0000 mg | INTRAVENOUS | Status: DC
  Administered 2020-08-16: 750 mg via INTRAVENOUS
  Filled 2020-08-15: qty 150

## 2020-08-15 MED ORDER — METRONIDAZOLE IN NACL 5-0.79 MG/ML-% IV SOLN
500.0000 mg | Freq: Once | INTRAVENOUS | Status: AC
  Administered 2020-08-15: 500 mg via INTRAVENOUS
  Filled 2020-08-15: qty 100

## 2020-08-15 MED ORDER — LACTATED RINGERS IV BOLUS (SEPSIS)
1500.0000 mL | Freq: Once | INTRAVENOUS | Status: AC
  Administered 2020-08-15: 1500 mL via INTRAVENOUS

## 2020-08-15 MED ORDER — FENTANYL CITRATE (PF) 100 MCG/2ML IJ SOLN
25.0000 ug | Freq: Once | INTRAMUSCULAR | Status: AC
  Administered 2020-08-15: 25 ug via INTRAVENOUS
  Filled 2020-08-15: qty 2

## 2020-08-15 MED ORDER — HYDROCODONE-ACETAMINOPHEN 5-325 MG PO TABS
1.0000 | ORAL_TABLET | Freq: Four times a day (QID) | ORAL | Status: DC | PRN
  Administered 2020-08-16 (×2): 1 via ORAL
  Filled 2020-08-15 (×3): qty 1

## 2020-08-15 MED ORDER — METRONIDAZOLE IN NACL 5-0.79 MG/ML-% IV SOLN
500.0000 mg | Freq: Three times a day (TID) | INTRAVENOUS | Status: DC
  Administered 2020-08-16: 500 mg via INTRAVENOUS
  Filled 2020-08-15: qty 100

## 2020-08-15 MED ORDER — LACTATED RINGERS IV BOLUS: 1000.0000 mL | Freq: Once | INTRAVENOUS | Status: DC

## 2020-08-15 MED ORDER — LACTATED RINGERS IV SOLN: INTRAVENOUS | Status: DC

## 2020-08-15 MED ORDER — SENNOSIDES-DOCUSATE SODIUM 8.6-50 MG PO TABS: 1.0000 | ORAL_TABLET | Freq: Every evening | ORAL | Status: DC | PRN

## 2020-08-15 MED ORDER — SODIUM CHLORIDE 0.9 % IV BOLUS
250.0000 mL | Freq: Once | INTRAVENOUS | Status: AC
  Administered 2020-08-15: 250 mL via INTRAVENOUS

## 2020-08-15 MED ORDER — SODIUM CHLORIDE 0.9% FLUSH
3.0000 mL | Freq: Two times a day (BID) | INTRAVENOUS | Status: DC
  Administered 2020-08-15 – 2020-08-21 (×8): 3 mL via INTRAVENOUS

## 2020-08-15 NOTE — ED Notes (Signed)
Pulled D50 per [place], [place]

## 2020-08-15 NOTE — ED Provider Notes (Signed)
[facility]-EMERGENCY DEPT Provider Note   CSN: [id number] Arrival date & time: 08/15/20  1644     History Chief Complaint  Patient presents with  . Weakness    Crystal Espinoza is a 86 y.o. female.  The history is provided by the patient, the EMS personnel and medical records.  Weakness  Crystal Espinoza is a 86 y.o. female who presents to the Emergency Department complaining of altered mental status. She presents the emergency department by EMS for altered mental status. She lives at home alone and normally uses a walker for ambulation at baseline. Her family checked on her every other day and last known normal was yesterday. Today when they came to check on her she could not get up to meet them at the door and has been unable to ambulate. She has been drowsy compared to baseline. EMS reports perioral cyanosis and cyanosis of the feet, hands. She did have her second COVID vaccine yesterday. EMS reports blood sugar of 87. On ED arrival patient is blood sugar 34. On presentation patient reports not feeling well and complains of abdominal pain.     Past Medical History:  Diagnosis Date  . Cataract   . CREST syndrome (HCC)   . Hypertension   . Nausea   . Paroxysmal atrial fibrillation (HCC) 02/02/2017  . Severe pulmonary hypertension (HCC)    on cath 02/2017  . Symptomatic bradycardia 04/08/2017    Patient Active Problem List   Diagnosis Date Noted  . Abdominal pain 08/15/2020  . Fever 08/15/2020  . Chronic systolic CHF (congestive heart failure) (HCC) 08/15/2020  . Hypoglycemia without diagnosis of diabetes mellitus 06/05/2020  . Encephalopathy 06/05/2020  . Acute encephalopathy 06/04/2020  . Seizure (HCC)   . CVA (cerebral vascular accident) (HCC) 01/20/2020  . AMS (altered mental status) 01/20/2020  . SOB (shortness of breath)   . Pacemaker 06/30/2019  . Symptomatic bradycardia 04/08/2017  . Paroxysmal atrial fibrillation (HCC) 02/02/2017  . RBBB  02/27/2014  . PAC (premature atrial contraction) 02/27/2014  . CREST syndrome (HCC) 02/27/2014    Past Surgical History:  Procedure Laterality Date  . ABDOMINAL HYSTERECTOMY    . EYE SURGERY    . PACEMAKER IMPLANT N/A 04/08/2017   Procedure: Pacemaker Implant;  Surgeon: Marinus Maw, MD;  Location: [facility] INVASIVE CV LAB;  Service: Cardiovascular;  Laterality: N/A;     OB History   No obstetric history on file.     Family History  Problem Relation Age of Onset  . Heart disease Mother   . Stroke Father   . Stroke Sister   . Stroke Sister   . Cancer Sister   . Diabetes Sister     Social History   Tobacco Use  . Smoking status: Never Smoker  . Smokeless tobacco: Never Used  Vaping Use  . Vaping Use: Never used  Substance Use Topics  . Alcohol use: No  . Drug use: No    Home Medications Prior to Admission medications   Medication Sig Start Date End Date Taking? Authorizing Provider  amLODipine (NORVASC) 5 MG tablet Take 1 tablet (5 mg total) by mouth daily. *NEEDS MD VISIT FOR FURTHER REFILLS-PLEASE CALL TO SCHEDULE.* 08/11/19   Chilton Si, MD  atorvastatin (LIPITOR) 40 MG tablet Take 1 tablet (40 mg total) by mouth daily at 6 PM. 01/25/20   Amin, Loura Halt, MD  levETIRAcetam (KEPPRA) 250 MG tablet Take 1 tablet (250 mg total) by mouth 2 (two) times daily.  01/25/20   Amin, Loura Halt, MD  levothyroxine (SYNTHROID) 100 MCG tablet Take 100 mcg by mouth daily.     [provider]  Rivaroxaban (XARELTO) 15 MG TABS tablet Take 1 tablet (15 mg total) by mouth daily with supper. Patient not taking: Reported on 08/07/2020 01/25/20   Dimple Nanas, MD    Allergies    Levaquin [levofloxacin]  Review of Systems   Review of Systems  Neurological: Positive for weakness.  All other systems reviewed and are negative.   Physical Exam Updated Vital Signs BP (!) 137/58   Pulse (!) 59   Temp (!) 97.5 F (36.4 C) (Oral)   Resp 20   Ht 5\' 6"  (1.676 m)    Wt 52.7 kg   SpO2 100%   BMI 18.75 kg/m   Physical Exam Vitals and nursing note reviewed.  Constitutional:      General: She is in acute distress.     Appearance: She is well-developed. She is ill-appearing.  HENT:     Head: Normocephalic and atraumatic.  Cardiovascular:     Rate and Rhythm: Normal rate and regular rhythm.  Pulmonary:     Effort: Pulmonary effort is normal. No respiratory distress.  Abdominal:     Palpations: Abdomen is soft.     Tenderness: There is no abdominal tenderness. There is no guarding or rebound.  Musculoskeletal:        General: Swelling present. No tenderness.     Comments: Cyanosis of the digits in bilateral upper extremities. Proximal upper extremities are warm. Brisk cap refill despite extremity cyanosis.  Skin:    General: Skin is warm and dry.  Neurological:     Mental Status: She is alert.     Comments: Awake. Slow to answer questions. Oriented to hospital.  Psychiatric:        Behavior: Behavior normal.     ED Results / Procedures / Treatments   Labs (all labs ordered are listed, but only abnormal results are displayed) Labs Reviewed  COMPREHENSIVE METABOLIC PANEL - Abnormal; Notable for the following components:      Result Value   Sodium 133 (*)    Glucose, Bld 125 (*)    Calcium 8.8 (*)    AST 14 (*)    All other components within normal limits  CBC WITH DIFFERENTIAL/PLATELET - Abnormal; Notable for the following components:   RBC 3.38 (*)    Hemoglobin 11.7 (*)    MCV 107.7 (*)    MCH 34.6 (*)    RDW 17.2 (*)    Lymphs Abs 0.6 (*)    All other components within normal limits  URINALYSIS, ROUTINE W REFLEX MICROSCOPIC - Abnormal; Notable for the following components:   Hgb urine dipstick SMALL (*)    Bacteria, UA RARE (*)    All other components within normal limits  LACTIC ACID, PLASMA - Abnormal; Notable for the following components:   Lactic Acid, Venous 2.4 (*)    All other components within normal limits  LACTIC  ACID, PLASMA - Abnormal; Notable for the following components:   Lactic Acid, Venous 3.9 (*)    All other components within normal limits  CBG MONITORING, ED - Abnormal; Notable for the following components:   Glucose-Capillary 34 (*)    All other components within normal limits  I-STAT CHEM 8, ED - Abnormal; Notable for the following components:   Sodium 133 (*)    Glucose, Bld 122 (*)    Hemoglobin 11.9 (*)  HCT 35.0 (*)    All other components within normal limits  CBG MONITORING, ED - Abnormal; Notable for the following components:   Glucose-Capillary <10 (*)    All other components within normal limits  RESPIRATORY PANEL BY RT PCR (FLU A&B, COVID)  URINE CULTURE  CULTURE, BLOOD (ROUTINE X 2)  CULTURE, BLOOD (ROUTINE X 2)  HEMOGLOBIN A1C  CBC WITH DIFFERENTIAL/PLATELET  COMPREHENSIVE METABOLIC PANEL  LACTIC ACID, PLASMA  LACTIC ACID, PLASMA  PROTIME-INR  APTT  CORTISOL  PROCALCITONIN  PROCALCITONIN  TSH  CBG MONITORING, ED  CBG MONITORING, ED    EKG None  Radiology CT Abdomen Pelvis W Contrast  Result Date: 08/15/2020 CLINICAL DATA:  Abdominal pain and fever.  Lethargy. EXAM: CT ABDOMEN AND PELVIS WITH CONTRAST TECHNIQUE: Multidetector CT imaging of the abdomen and pelvis was performed using the standard protocol following bolus administration of intravenous contrast. CONTRAST:  OMNIPAQUE IOHEXOL 300 MG/ML  SOLN COMPARISON:  No prior exams available. FINDINGS: Lower chest: Multi chamber cardiomegaly. Pacemaker partially included. Improving motion artifact in the lung bases obscures detailed assessment, however there is suggestion of patchy ground-glass opacities in the left lower lobe. Hepatobiliary: No focal hepatic abnormality. Gallbladder is not well visualized, however decompressed or surgically absent. Normal caliber common bile duct, slight central intrahepatic biliary ductal dilatation. Pancreas: Fatty atrophy.  No ductal dilatation or inflammation. Spleen:  Normal in size without focal abnormality. Adrenals/Urinary Tract: Adrenal thickening without dominant nodule. No hydronephrosis or perinephric edema. Mild motion artifact through the upper kidneys partially obscures detailed assessment. No obvious renal calculi. Homogeneous enhancement with symmetric excretion on delayed phase imaging. No bladder wall thickening. Stomach/Bowel: Bowel evaluation is limited in the absence of enteric contrast and paucity of intra-abdominal fat. Additionally there is motion artifact particularly in the upper abdomen. Stomach is partially distended. There is equivocal hyperemia of the pre pyloric stomach, for example series 2, image 39. No small bowel obstruction or inflammatory change. Scattered fecalization of small bowel contents. Wall thickening of the ascending colon versus nondistention. Moderate stool burden primarily in the left colon. There is sigmoid colonic redundancy. Scattered diverticulosis involving the descending and sigmoid colon without acute diverticulitis. Vascular/Lymphatic: Aortic and branch atherosclerosis. No aortic aneurysm. Iliac vessels are tortuous. No bulky abdominopelvic adenopathy. Reproductive: Status post hysterectomy. No adnexal masses. Other: No ascites or free air. Small fat containing umbilical hernia. No focal fluid collection. Musculoskeletal: Diffuse degenerative change throughout the spine and involving both hips. Bones are under mineralized. There are no acute or suspicious osseous abnormalities. IMPRESSION: 1. Equivocal hyperemia of the pre pyloric stomach, can be seen with gastritis or peptic ulcer disease. 2. Wall thickening of the ascending colon versus nondistention. 3. Colonic diverticulosis without diverticulitis. 4. Cardiomegaly. 5. Ground-glass opacities in the left lower lobe may be infectious or inflammatory, including COVID-19 pneumonia. Assessment is limited by breathing motion. Aortic Atherosclerosis (ICD10-I70.0). Electronically  Signed   By: Narda Rutherford M.D.   On: 08/15/2020 19:46   DG Chest Port 1 View  Result Date: 08/15/2020 CLINICAL DATA:  Altered mental status. EXAM: PORTABLE CHEST 1 VIEW COMPARISON:  August 01, 2020. FINDINGS: Stable cardiomegaly with mild central pulmonary vascular congestion. Left-sided pacemaker is unchanged in position. No pneumothorax or pleural effusion is noted. Bony thorax is unremarkable. IMPRESSION: Stable cardiomegaly with mild central pulmonary vascular congestion. Aortic Atherosclerosis (ICD10-I70.0). Electronically Signed   By: Lupita Raider M.D.   On: 08/15/2020 18:45    Procedures Procedures (including critical care time) CRITICAL CARE Performed  by: Tilden Fossa   Total critical care time: 40 minutes  Critical care time was exclusive of separately billable procedures and treating other patients.  Critical care was necessary to treat or prevent imminent or life-threatening deterioration.  Critical care was time spent personally by me on the following activities: development of treatment plan with patient and/or surrogate as well as nursing, discussions with consultants, evaluation of patient's response to treatment, examination of patient, obtaining history from patient or surrogate, ordering and performing treatments and interventions, ordering and review of laboratory studies, ordering and review of radiographic studies, pulse oximetry and re-evaluation of patient's condition.  Medications Ordered in ED Medications  metroNIDAZOLE (FLAGYL) IVPB 500 mg (has no administration in time range)  vancomycin (VANCOCIN) IVPB 1000 mg/200 mL premix (1,000 mg Intravenous New Bag/Given 08/15/20 2305)  lactated ringers bolus 1,500 mL (1,500 mLs Intravenous New Bag/Given 08/15/20 2304)  pantoprazole (PROTONIX) injection 40 mg (has no administration in time range)  sodium chloride flush (NS) 0.9 % injection 3 mL (3 mLs Intravenous Given 08/15/20 2304)  acetaminophen (TYLENOL) tablet  650 mg (has no administration in time range)    Or  acetaminophen (TYLENOL) suppository 650 mg (has no administration in time range)  HYDROcodone-acetaminophen (NORCO/VICODIN) 5-325 MG per tablet 1 tablet (has no administration in time range)  senna-docusate (Senokot-S) tablet 1 tablet (has no administration in time range)  ondansetron (ZOFRAN) tablet 4 mg (has no administration in time range)    Or  ondansetron (ZOFRAN) injection 4 mg (has no administration in time range)  Rivaroxaban (XARELTO) tablet 15 mg (has no administration in time range)  levETIRAcetam (KEPPRA) tablet 250 mg (has no administration in time range)  levothyroxine (SYNTHROID) tablet 100 mcg (has no administration in time range)  ceFEPIme (MAXIPIME) 2 g in sodium chloride 0.9 % 100 mL IVPB (has no administration in time range)  vancomycin (VANCOREADY) IVPB 750 mg/150 mL (has no administration in time range)  ceFEPIme (MAXIPIME) 2 g in sodium chloride 0.9 % 100 mL IVPB (0 g Intravenous Stopped 08/15/20 1854)  metroNIDAZOLE (FLAGYL) IVPB 500 mg (0 mg Intravenous Stopped 08/15/20 1914)  fentaNYL (SUBLIMAZE) injection 25 mcg (25 mcg Intravenous Given 08/15/20 1915)  sodium chloride 0.9 % bolus 250 mL (0 mLs Intravenous Stopped 08/15/20 2120)  iohexol (OMNIPAQUE) 300 MG/ML solution 100 mL (100 mLs Intravenous Contrast Given 08/15/20 1922)    ED Course  I have reviewed the triage vital signs and the nursing notes.  Pertinent labs & imaging results that were available during my care of the patient were reviewed by me and considered in my medical decision making (see chart for details).    MDM Rules/Calculators/A&P                         patient presents the emergency department for malaise, altered mental status. Patient ill appearing on evaluation with cyanosis of her extremities, abdominal tenderness. Initial blood sugar very low and she took orange juice. Repeat assessment was even lower. Her blood sugars were drawn through  her cyanotic fingertips. Repeat blood sugar on an area that was not cyanotic with glucose of 98. She does have a history of crest syndrome with for knots. She was treated with antibiotics for empiric coverage of possible intra-abdominal infection given her fever and presentation. CT abdomen is negative for acute process, question possible pneumonia. It is also possible that her fever is secondary to her recent COVID vaccine. Given her constellation of symptoms and comorbidities  recommend admission for further workup. Hospitalist consulted for admission. Patient and son updated of findings of studies and recommendation for mission and they are in agreement treatment plan.  Final Clinical Impression(s) / ED Diagnoses Final diagnoses:  None    Rx / DC Orders ED Discharge Orders    None       Tilden Fossaees, Nowell Sites, MD 08/15/20 2319

## 2020-08-15 NOTE — ED Notes (Signed)
Date and time results received: 08/15/20 1837 (use smartphrase ".now" to insert current time)  Test: lactic Critical Value: 2.4  Name of Provider Notified: Tilden Fossa MD  Orders Received? Or Actions Taken?: no orders at this time

## 2020-08-15 NOTE — ED Notes (Addendum)
Pt reports that she "has hurt all day and I want my pain shot". MD made aware

## 2020-08-15 NOTE — H&P (Signed)
History and Physical    Crystal Espinoza UVO:[id number] DOB: 1929-03-23 DOA: 08/15/2020  PCP: Merri Brunette, MD   Patient coming from: Home   Chief Complaint: Lethargy   HPI: Crystal Espinoza is a 86 y.o. female with medical history significant for paroxysmal atrial fibrillation on Xarelto, seizure disorder, crest syndrome, and debility, presenting to the emergency department with lethargy.  Patient lives alone with family checking on her frequently, saw her in her usual state yesterday, but then found her to be lethargic and confused today.  At her baseline, the patient reportedly ambulates with a walker and is fully oriented.  Today, she was too lethargic and generally weak to ambulate and was more confused than usual.  She received a COVID-19 vaccine yesterday.  She denies cough or shortness of breath.  She reports aches and pains "all over."  She had reported abdominal pain earlier but denies that now.   ED Course: Upon arrival to the ED, patient is found to be febrile to 38.5 C, saturating mid 90s on room air, tachypneic, and with stable blood pressure.  CBG was 34 on arrival and treated with oral and IV glucose.  Chemistry panel is unremarkable and CBC notable for mild microcytic anemia.  Lactic acid is elevated.  Chest x-ray notable for stable cardiomegaly and mild vascular congestion.  CT the abdomen and pelvis features equivocal hyperemia of the prepyloric stomach which could reflect gastritis or PUD.  COVID-19 PCR is negative.  CT is also notable for groundglass opacities in the left lower lobe.  Blood and urine cultures were collected in the ED and the patient was given 250 cc of saline, cefepime, and Flagyl.  Review of Systems:  All other systems reviewed and apart from HPI, are negative.  Past Medical History:  Diagnosis Date   Cataract    CREST syndrome (HCC)    Hypertension    Nausea    Paroxysmal atrial fibrillation (HCC) 02/02/2017   Severe pulmonary hypertension (HCC)     on cath 02/2017   Symptomatic bradycardia 04/08/2017    Past Surgical History:  Procedure Laterality Date   ABDOMINAL HYSTERECTOMY     EYE SURGERY     PACEMAKER IMPLANT N/A 04/08/2017   Procedure: Pacemaker Implant;  Surgeon: Marinus Maw, MD;  Location: [facility];  Service: Cardiovascular;  Laterality: N/A;    Social History:   reports that she has never smoked. She has never used smokeless tobacco. She reports that she does not drink alcohol and does not use drugs.  Allergies  Allergen Reactions   Levaquin [Levofloxacin] Other (See Comments)    unknown    Family History  Problem Relation Age of Onset   Heart disease Mother    Stroke Father    Stroke Sister    Stroke Sister    Cancer Sister    Diabetes Sister      Prior to Admission medications   Medication Sig Start Date End Date Taking? Authorizing Provider  amLODipine (NORVASC) 5 MG tablet Take 1 tablet (5 mg total) by mouth daily. *NEEDS MD VISIT FOR FURTHER REFILLS-PLEASE CALL TO SCHEDULE.* 08/11/19   Chilton Si, MD  atorvastatin (LIPITOR) 40 MG tablet Take 1 tablet (40 mg total) by mouth daily at 6 PM. 01/25/20   Amin, Loura Halt, MD  levETIRAcetam (KEPPRA) 250 MG tablet Take 1 tablet (250 mg total) by mouth 2 (two) times daily. 01/25/20   Amin, Loura Halt, MD  levothyroxine (SYNTHROID) 100 MCG tablet Take 100  mcg by mouth daily.     [provider]  Rivaroxaban (XARELTO) 15 MG TABS tablet Take 1 tablet (15 mg total) by mouth daily with supper. Patient not taking: Reported on 08/07/2020 01/25/20   Dimple Nanas, MD    Physical Exam: Vitals:   08/15/20 1854 08/15/20 1854 08/15/20 1900 08/15/20 2000  BP: (!) 137/57 (!) 132/56 (!) 139/55 (!) 166/63  Pulse:  63 (!) 59 60  Resp:  20 (!) 40 10  Temp:      TempSrc:      SpO2:  98% 100% 97%  Weight:      Height:        Constitutional: NAD, calm  Eyes: PERTLA, lids and conjunctivae normal ENMT: Mucous membranes are  moist. Posterior pharynx clear of any exudate or lesions.   Neck: normal, supple, no masses, no thyromegaly Respiratory: no wheezing, no crackles. No accessory muscle use.  Cardiovascular: Rate ~60 and irregularly irregular. Mild lower leg swelling bilaterally.  Abdomen: No distension, no tenderness, soft. Bowel sounds active.  Musculoskeletal: acrocyanosis noted. No joint deformity upper and lower extremities.   Skin: Erythema involving bilateral LEs in gaiter distribution. Poor turgor.  Neurologic: CN 2-12 grossly intact. Sensation to light touch intact. Strength 5/5 in all 4 limbs.  Psychiatric: Alert and oriented to person and place only. Calm and cooperative.    Labs and Imaging on Admission: I have personally reviewed following labs and imaging studies  CBC: Recent Labs  Lab 08/15/20 1720 08/15/20 1736  WBC 5.5  --   NEUTROABS 4.6  --   HGB 11.7* 11.9*  HCT 36.4 35.0*  MCV 107.7*  --   PLT 166  --    Basic Metabolic Panel: Recent Labs  Lab 08/15/20 1720 08/15/20 1736  NA 133* 133*  K 4.3 4.1  CL 101 99  CO2 23  --   GLUCOSE 125* 122*  BUN 13 12  CREATININE 0.64 0.70  CALCIUM 8.8*  --    GFR: Estimated Creatinine Clearance: 39.7 mL/min (by C-G formula based on SCr of 0.7 mg/dL). Liver Function Tests: Recent Labs  Lab 08/15/20 1720  AST 14*  ALT 10  ALKPHOS 123  BILITOT 1.2  PROT 7.1  ALBUMIN 3.7   No results for input(s): LIPASE, AMYLASE in the last 168 hours. No results for input(s): AMMONIA in the last 168 hours. Coagulation Profile: No results for input(s): INR, PROTIME in the last 168 hours. Cardiac Enzymes: No results for input(s): CKTOTAL, CKMB, CKMBINDEX, TROPONINI in the last 168 hours. BNP (last 3 results) No results for input(s): PROBNP in the last 8760 hours. HbA1C: No results for input(s): HGBA1C in the last 72 hours. CBG: Recent Labs  Lab 08/15/20 1708 08/15/20 1736 08/15/20 1740 08/15/20 1844  GLUCAP 34* <10* 98 99   Lipid  Profile: No results for input(s): CHOL, HDL, LDLCALC, TRIG, CHOLHDL, LDLDIRECT in the last 72 hours. Thyroid Function Tests: No results for input(s): TSH, T4TOTAL, FREET4, T3FREE, THYROIDAB in the last 72 hours. Anemia Panel: No results for input(s): VITAMINB12, FOLATE, FERRITIN, TIBC, IRON, RETICCTPCT in the last 72 hours. Urine analysis:    Component Value Date/Time   COLORURINE YELLOW 08/15/2020 1815   APPEARANCEUR CLEAR 08/15/2020 1815   LABSPEC 1.010 08/15/2020 1815   PHURINE 7.0 08/15/2020 1815   GLUCOSEU NEGATIVE 08/15/2020 1815   HGBUR SMALL (A) 08/15/2020 1815   BILIRUBINUR NEGATIVE 08/15/2020 1815   KETONESUR NEGATIVE 08/15/2020 1815   PROTEINUR NEGATIVE 08/15/2020 1815   NITRITE NEGATIVE  08/15/2020 1815   LEUKOCYTESUR NEGATIVE 08/15/2020 1815   Sepsis Labs: @LABRCNTIP (procalcitonin:4,lacticidven:4) ) Recent Results (from the past 240 hour(s))  Respiratory Panel by RT PCR (Flu A&B, Covid) - Nasopharyngeal Swab     Status: None   Collection Time: 08/15/20  5:44 PM   Specimen: Nasopharyngeal Swab  Result Value Ref Range Status   SARS Coronavirus 2 by RT PCR NEGATIVE NEGATIVE Final    Comment: (NOTE) SARS-CoV-2 target nucleic acids are NOT DETECTED.  The SARS-CoV-2 RNA is generally detectable in upper respiratoy specimens during the acute phase of infection. The lowest concentration of SARS-CoV-2 viral copies this assay can detect is 131 copies/mL. A negative result does not preclude SARS-Cov-2 infection and should not be used as the sole basis for treatment or other patient management decisions. A negative result may occur with  improper specimen collection/handling, submission of specimen other than nasopharyngeal swab, presence of viral mutation(s) within the areas targeted by this assay, and inadequate number of viral copies (<131 copies/mL). A negative result must be combined with clinical observations, patient history, and epidemiological information.  The expected result is Negative.  Fact Sheet for Patients:  13/02/21  Fact Sheet for Healthcare Providers:  https://www.moore.com/  This test is no t yet approved or cleared by the https://www.young.biz/ FDA and  has been authorized for detection and/or diagnosis of SARS-CoV-2 by FDA under an Emergency Use Authorization (EUA). This EUA will remain  in effect (meaning this test can be used) for the duration of the COVID-19 declaration under Section 564(b)(1) of the Act, 21 U.S.C. section 360bbb-3(b)(1), unless the authorization is terminated or revoked sooner.     Influenza A by PCR NEGATIVE NEGATIVE Final   Influenza B by PCR NEGATIVE NEGATIVE Final    Comment: (NOTE) The Xpert Xpress SARS-CoV-2/FLU/RSV assay is intended as an aid in  the diagnosis of influenza from Nasopharyngeal swab specimens and  should not be used as a sole basis for treatment. Nasal washings and  aspirates are unacceptable for Xpert Xpress SARS-CoV-2/FLU/RSV  testing.  Fact Sheet for Patients: [place]  Fact Sheet for Healthcare Providers: https://www.moore.com/  This test is not yet approved or cleared by the https://www.young.biz/ FDA and  has been authorized for detection and/or diagnosis of SARS-CoV-2 by  FDA under an Emergency Use Authorization (EUA). This EUA will remain  in effect (meaning this test can be used) for the duration of the  Covid-19 declaration under Section 564(b)(1) of the Act, 21  U.S.C. section 360bbb-3(b)(1), unless the authorization is  terminated or revoked. Performed at [facility], [address] [place], [place]      Radiological Exams on Admission: CT Abdomen Pelvis W Contrast  Result Date: 08/15/2020 CLINICAL DATA:  Abdominal pain and fever.  Lethargy. EXAM: CT ABDOMEN AND PELVIS WITH CONTRAST TECHNIQUE: Multidetector CT imaging of the abdomen and  pelvis was performed using the standard protocol following bolus administration of intravenous contrast. CONTRAST:  13/11/2019 OMNIPAQUE IOHEXOL 300 MG/ML  SOLN COMPARISON:  No prior exams available. FINDINGS: Lower chest: Multi chamber cardiomegaly. Pacemaker partially included. Improving motion artifact in the lung bases obscures detailed assessment, however there is suggestion of patchy ground-glass opacities in the left lower lobe. Hepatobiliary: No focal hepatic abnormality. Gallbladder is not well visualized, however decompressed or surgically absent. Normal caliber common bile duct, slight central intrahepatic biliary ductal dilatation. Pancreas: Fatty atrophy.  No ductal dilatation or inflammation. Spleen: Normal in size without focal abnormality. Adrenals/Urinary Tract: Adrenal thickening without dominant  nodule. No hydronephrosis or perinephric edema. Mild motion artifact through the upper kidneys partially obscures detailed assessment. No obvious renal calculi. Homogeneous enhancement with symmetric excretion on delayed phase imaging. No bladder wall thickening. Stomach/Bowel: Bowel evaluation is limited in the absence of enteric contrast and paucity of intra-abdominal fat. Additionally there is motion artifact particularly in the upper abdomen. Stomach is partially distended. There is equivocal hyperemia of the pre pyloric stomach, for example series 2, image 39. No small bowel obstruction or inflammatory change. Scattered fecalization of small bowel contents. Wall thickening of the ascending colon versus nondistention. Moderate stool burden primarily in the left colon. There is sigmoid colonic redundancy. Scattered diverticulosis involving the descending and sigmoid colon without acute diverticulitis. Vascular/Lymphatic: Aortic and branch atherosclerosis. No aortic aneurysm. Iliac vessels are tortuous. No bulky abdominopelvic adenopathy. Reproductive: Status post hysterectomy. No adnexal masses. Other: No  ascites or free air. Small fat containing umbilical hernia. No focal fluid collection. Musculoskeletal: Diffuse degenerative change throughout the spine and involving both hips. Bones are under mineralized. There are no acute or suspicious osseous abnormalities. IMPRESSION: 1. Equivocal hyperemia of the pre pyloric stomach, can be seen with gastritis or peptic ulcer disease. 2. Wall thickening of the ascending colon versus nondistention. 3. Colonic diverticulosis without diverticulitis. 4. Cardiomegaly. 5. Ground-glass opacities in the left lower lobe may be infectious or inflammatory, including COVID-19 pneumonia. Assessment is limited by breathing motion. Aortic Atherosclerosis (ICD10-I70.0). Electronically Signed   By: Narda RutherfordMelanie  Sanford M.D.   On: 08/15/2020 19:46   DG Chest Port 1 View  Result Date: 08/15/2020 CLINICAL DATA:  Altered mental status. EXAM: PORTABLE CHEST 1 VIEW COMPARISON:  August 01, 2020. FINDINGS: Stable cardiomegaly with mild central pulmonary vascular congestion. Left-sided pacemaker is unchanged in position. No pneumothorax or pleural effusion is noted. Bony thorax is unremarkable. IMPRESSION: Stable cardiomegaly with mild central pulmonary vascular congestion. Aortic Atherosclerosis (ICD10-I70.0). Electronically Signed   By: Lupita RaiderJames  Green Jr M.D.   On: 08/15/2020 18:45    Assessment/Plan   1. Acute encephalopathy  - Presents with lethargy and confusion and is found to have hypoglycemia, fever, and elevated lactate but no focal neurologic deficits  - Treat hypoglycemia and possible sepsis, check TSH, continue supportive care   2. Hypoglycemia  - CBG was 34 on arrival, treated with juice and IV dextrose  - She does not appear to be on any medications that would cause this  - Possibly secondary to sepsis  - Check cortisol, check frequent CBGs, treat possible sepsis    3. Fever; elevated lactate  - Presents with AMS and found to have fever and elevated lactate without  leukocytosis, tachypnea, or tachycardia  - She denies cough or SOB and is not tachypneic; CXR clear and COVID negative  - Abdominal exam benign  - She has stasis dermatitis involving lower legs but does not appear to be infected  - No meningismus  - UA not suggestive of infection  - Blood and urine cultures were collected in ED and she was treated with cefepime and Flagyl  - Bolus 30cc/kg LR, continue broad-spectrum antibiotics for now, repeat lactate, check/trend procalcitonin, follow cultures and clinical course   4. Abdominal pain  - Patient reported abdominal pain on arrival, now denies  - CT findings suggest possible gastritis or PUD, exam is benign, will start PPI for now    5. Paroxysmal atrial fibrillation  - CHADS-VASc at least 3 (age x2, gender), will continue Xarelto    6. Seizures  - Continue  Keppra    7. Hypothyroidism  - Continue Synthroid, check TSH    DVT prophylaxis: Xarelto   Code Status: DNR per recent palliative care documentation and patient report  Family Communication: Discussed with patient  Disposition Plan:  Patient is from: Home  Anticipated d/c is to: TBD Anticipated d/c date is: 08/18/20 Patient currently: Pending improvement/stability of blood sugars, improved mental status  Consults called: None  Admission status: Inpatient     Briscoe Deutscher, MD [facility]  08/15/2020, 9:45 PM

## 2020-08-15 NOTE — ED Notes (Signed)
Pt drank about 4 oz of orange juice PO

## 2020-08-15 NOTE — ED Triage Notes (Signed)
Pt came from home via EMS. Lives alone, family comes over every day to check on her. Pt normally uses walker and shuffles at baseline, but could only stand up today. Last known normal was yesterday afternoon. Family reports that she normally answers the phone and the door when they check on her, but she could not do either of those things today. EMS reports that she answers all questions appropriately, but is lethargic. EMS could not get a pulse ox reading, put her on 4L just to be on the safe side. EMS reports cyanosis in mouth hands and feet; lips have improved but hands are still blue.  CBG: 87 20g in left forearm

## 2020-08-15 NOTE — ED Notes (Signed)
Pt given hot packs to warm up her hands

## 2020-08-15 NOTE — Progress Notes (Signed)
Pharmacy Antibiotic Note  Crystal Espinoza is a 86 y.o. female admitted on 08/15/2020 with medical history significant for paroxysmal atrial fibrillation on Xarelto, seizure disorder, crest syndrome, and debility, presenting to the ED with lethargy  Pharmacy has been consulted for vancomycin and cefepime dosing for sepsis.  Plan: Cefepime 2gm IV q12h Vancomycin 1gm IV x 1 then 750mg  q24h Follow renal function cultures and clinical course   Height: 5\' 6"  (167.6 cm) Weight: 52.7 kg (116 lb 2.9 oz) IBW/kg (Calculated) : 59.3  Temp (24hrs), Avg:99.2 F (37.3 C), Min:98.2 F (36.8 C), Max:101.3 F (38.5 C)  Recent Labs  Lab 08/15/20 1720 08/15/20 1736 08/15/20 1920  WBC 5.5  --   --   CREATININE 0.64 0.70  --   LATICACIDVEN 2.4*  --  3.9*    Estimated Creatinine Clearance: 39.7 mL/min (by C-G formula based on SCr of 0.7 mg/dL).    Allergies  Allergen Reactions  . Levaquin [Levofloxacin] Other (See Comments)    unknown    Antimicrobials this admission: 11/2 cefepime >> 11/2 vanc >> 11/2 flagyl >>  Dose adjustments this admission:   Microbiology results: 11/2 BCx:  11/2 UCx:   Thank you for allowing pharmacy to be a part of this patient's care.  13/2 RPh 08/15/2020, 11:13 PM

## 2020-08-15 NOTE — ED Notes (Signed)
BS: 34; PA notified

## 2020-08-15 NOTE — Progress Notes (Signed)
A consult was received from an ED physician for Cefepime per pharmacy dosing.  The patient's profile has been reviewed for ht/wt/allergies/indication/available labs.    A one time order has been placed for Cefepime 2g IV.  Further antibiotics/pharmacy consults should be ordered by admitting physician if indicated.                       Thank you, Jamse Mead 08/15/2020  5:30 PM

## 2020-08-15 NOTE — Sepsis Progress Note (Signed)
Communication with bedside team.  We are following her for a code sepsis. Her lactic acid went up on re-check. Guidelines recommend getting another one for follow up. It also looks as though she did not receive any volume resuscitation. If you feel it is approporiate, please order or document your rationale when you make your note, please.

## 2020-08-16 ENCOUNTER — Inpatient Hospital Stay (HOSPITAL_COMMUNITY)
Admit: 2020-08-16 | Discharge: 2020-08-16 | Disposition: A | Discharge status: Home / Self Care | Payer: Medicare Other | Attending: Internal Medicine | Admitting: Internal Medicine

## 2020-08-16 ENCOUNTER — Inpatient Hospital Stay (HOSPITAL_COMMUNITY): Payer: Medicare Other

## 2020-08-16 ENCOUNTER — Other Ambulatory Visit: Payer: Self-pay

## 2020-08-16 DIAGNOSIS — I480 Paroxysmal atrial fibrillation: Secondary | ICD-10-CM

## 2020-08-16 DIAGNOSIS — E162 Hypoglycemia, unspecified: Secondary | ICD-10-CM

## 2020-08-16 DIAGNOSIS — R569 Unspecified convulsions: Secondary | ICD-10-CM

## 2020-08-16 DIAGNOSIS — R6520 Severe sepsis without septic shock: Secondary | ICD-10-CM

## 2020-08-16 DIAGNOSIS — R109 Unspecified abdominal pain: Secondary | ICD-10-CM

## 2020-08-16 DIAGNOSIS — A419 Sepsis, unspecified organism: Principal | ICD-10-CM

## 2020-08-16 DIAGNOSIS — J189 Pneumonia, unspecified organism: Secondary | ICD-10-CM

## 2020-08-16 DIAGNOSIS — G9340 Encephalopathy, unspecified: Secondary | ICD-10-CM

## 2020-08-16 LAB — COMPREHENSIVE METABOLIC PANEL
ALT: 7 U/L (ref 0–44)
AST: 14 U/L — ABNORMAL LOW (ref 15–41)
Albumin: 3.2 g/dL — ABNORMAL LOW (ref 3.5–5.0)
Alkaline Phosphatase: 104 U/L (ref 38–126)
Anion gap: 8 (ref 5–15)
BUN: 10 mg/dL (ref 8–23)
CO2: 23 mmol/L (ref 22–32)
Calcium: 8.7 mg/dL — ABNORMAL LOW (ref 8.9–10.3)
Chloride: 102 mmol/L (ref 98–111)
Creatinine, Ser: 0.55 mg/dL (ref 0.44–1.00)
GFR, Estimated: >60 mL/min (ref >60)
Glucose, Bld: 105 mg/dL — ABNORMAL HIGH (ref 70–99)
Potassium: 4.1 mmol/L (ref 3.5–5.1)
Sodium: 133 mmol/L — ABNORMAL LOW (ref 135–145)
Total Bilirubin: 1.2 mg/dL (ref 0.3–1.2)
Total Protein: 5.9 g/dL — ABNORMAL LOW (ref 6.5–8.1)

## 2020-08-16 LAB — CBC WITH DIFFERENTIAL/PLATELET
Abs Immature Granulocytes: 0.04 10*3/uL (ref 0.00–0.07)
Basophils Absolute: 0 10*3/uL (ref 0.0–0.1)
Basophils Relative: 1 %
Eosinophils Absolute: 0 10*3/uL (ref 0.0–0.5)
Eosinophils Relative: 0 %
HCT: 33 % — ABNORMAL LOW (ref 36.0–46.0)
Hemoglobin: 10.9 g/dL — ABNORMAL LOW (ref 12.0–15.0)
Immature Granulocytes: 1 %
Lymphocytes Relative: 19 %
Lymphs Abs: 0.6 10*3/uL — ABNORMAL LOW (ref 0.7–4.0)
MCH: 34.9 pg — ABNORMAL HIGH (ref 26.0–34.0)
MCHC: 33 g/dL (ref 30.0–36.0)
MCV: 105.8 fL — ABNORMAL HIGH (ref 80.0–100.0)
Monocytes Absolute: 0.2 10*3/uL (ref 0.1–1.0)
Monocytes Relative: 8 %
Neutro Abs: 2.2 10*3/uL (ref 1.7–7.7)
Neutrophils Relative %: 71 %
Platelets: 133 10*3/uL — ABNORMAL LOW (ref 150–400)
RBC: 3.12 MIL/uL — ABNORMAL LOW (ref 3.87–5.11)
RDW: 17.4 % — ABNORMAL HIGH (ref 11.5–15.5)
WBC: 3 10*3/uL — ABNORMAL LOW (ref 4.0–10.5)
nRBC: 0 % (ref 0.0–0.2)

## 2020-08-16 LAB — HEMOGLOBIN A1C
Hgb A1c MFr Bld: 5.4 % (ref 4.8–5.6)
Mean Plasma Glucose: 108.28 mg/dL

## 2020-08-16 LAB — CBG MONITORING, ED: Glucose-Capillary: 135 mg/dL — ABNORMAL HIGH (ref 70–99)

## 2020-08-16 LAB — PROCALCITONIN
Procalcitonin: <0.1 ng/mL
Procalcitonin: <0.1 ng/mL

## 2020-08-16 LAB — CORTISOL: Cortisol, Plasma: 15 ug/dL

## 2020-08-16 LAB — LACTIC ACID, PLASMA
Lactic Acid, Venous: 1 mmol/L (ref 0.5–1.9)
Lactic Acid, Venous: 2.1 mmol/L (ref 0.5–1.9)
Lactic Acid, Venous: 2.4 mmol/L (ref 0.5–1.9)

## 2020-08-16 LAB — TSH: TSH: 2.472 u[IU]/mL (ref 0.350–4.500)

## 2020-08-16 MED ORDER — ADULT MULTIVITAMIN W/MINERALS CH
1.0000 | ORAL_TABLET | Freq: Every day | ORAL | Status: DC
  Administered 2020-08-16 – 2020-08-21 (×5): 1 via ORAL
  Filled 2020-08-16 (×6): qty 1

## 2020-08-16 MED ORDER — HYDROCODONE-ACETAMINOPHEN 5-325 MG PO TABS
1.0000 | ORAL_TABLET | Freq: Four times a day (QID) | ORAL | Status: DC | PRN
  Administered 2020-08-17: 1 via ORAL
  Filled 2020-08-16: qty 1
  Filled 2020-08-16: qty 2

## 2020-08-16 MED ORDER — MORPHINE SULFATE (PF) 2 MG/ML IV SOLN
1.0000 mg | INTRAVENOUS | Status: DC | PRN
  Administered 2020-08-16 – 2020-08-19 (×4): 1 mg via INTRAVENOUS
  Filled 2020-08-16 (×4): qty 1

## 2020-08-16 MED ORDER — DEXTROSE-NACL 5-0.9 % IV SOLN: INTRAVENOUS | Status: DC

## 2020-08-16 MED ORDER — BOOST PLUS PO LIQD
237.0000 mL | Freq: Two times a day (BID) | ORAL | Status: DC
  Administered 2020-08-16 – 2020-08-21 (×9): 237 mL via ORAL
  Filled 2020-08-16 (×11): qty 237

## 2020-08-16 NOTE — Sepsis Progress Note (Signed)
Appropriate volume resuscitation given and decrease in lactic acid. S/o Sepsis tracking.

## 2020-08-16 NOTE — Progress Notes (Signed)
EEG complete - results pending 

## 2020-08-16 NOTE — Procedures (Signed)
Patient Name: EMILA STEINHAUSER  MRN: [id number]  Epilepsy Attending: Charlsie Quest  Referring Physician/Provider: Dr Glade Lloyd Date: 08/16/2020 Duration: 26.24 mins  Patient history: 86 year old female with history of epilepsy who presented with altered mental status.  EEG to evaluate for seizures.  Level of alertness: Awake  AEDs during EEG study: Keppra  Technical aspects: This EEG study was done with scalp electrodes positioned according to the 10-20 International system of electrode placement. Electrical activity was acquired at a sampling rate of 500Hz  and reviewed with a high frequency filter of 70Hz  and a low frequency filter of 1Hz . EEG data were recorded continuously and digitally stored.   Description: The posterior dominant rhythm consists of 8 Hz activity of moderate voltage (25-35 uV) seen predominantly in posterior head regions, symmetric and reactive to eye opening and eye closing. EEG showed continuous generalized 3 to 6 Hz theta-delta slowing. Hyperventilation and photic stimulation were not performed.     ABNORMALITY -Continuous slow, generalized  IMPRESSION: This study is suggestive of mild diffuse encephalopathy, nonspecific etiology.  No seizures or epileptiform discharges were seen throughout the recording.  Aralyn Nowak 

## 2020-08-16 NOTE — Progress Notes (Signed)
Initial Nutrition Assessment  DOCUMENTATION CODES:   Not applicable  INTERVENTION:  - will order Boost Plus BID, each supplement provides 360 kcal and 14 grams of protein. - will order Magic Cup BID with meals, each supplement provides 290 kcal and 9 grams of protein. - will order 1 tablet multivitamin with minerals/day. - will complete NFPE at follow-up.    NUTRITION DIAGNOSIS:   Increased nutrient needs related to acute illness (sepsis) as evidenced by estimated needs.  GOAL:   Patient will meet greater than or equal to 90% of their needs  MONITOR:   PO intake, Supplement acceptance, Labs, Weight trends  REASON FOR ASSESSMENT:   Malnutrition Screening Tool  ASSESSMENT:   86 y.o. female with medical history of afib on Xarelto, seizure disorder, crest syndrome, and debility. She presented to the ED due to lethargy and confusion. She lives alone and family checks on her frequently. At baseline, she is able to ambulate with a walker and is a/o x4. In the ED she reported aches and pains "all over".  Patient was eating lunch and daughter was at bedside at the time of visit. Transport came to take patient to CT at the same time as RD visit so unable to complete NFPE or talk with patient at this time.  Able to talk with her daughter who checks on her 3-4 days/week. Patient's son, daughter's husband, and daughter's husband's brother also check on patient.  Patient ambulates with the assistance of a walker but gets tired and short of breath very easily so she is unable to prepare any food items for herself. Family prepares all meals and they have to set it in front of patient for her to eat. She does best with soft foods that are already cut into bite-sized pieces.   Transporter and RD noted that patient's fingers appear purple-blue. Daughter states that this is normal for the patient and that fingers have been like this for at least a few months. Sometimes it causes numbness and  patient's inability to self-feed.   Weight today is 116 lb and PTA the most recently documented weight was on 01/20/20 when she weighed 120 lb. Moderate pitting edema to BLE documented in the flow sheet.    Labs reviewed; CBG: 135 mg/dl, Na: 376 mmol/l, Ca: 8.7 mg/dl. Medications reviewed; 100 mcg oral synthroid/day, 40 mg IV protonix BID. IVF; D5-NS @ 75 ml/hr (306 kcal).    NUTRITION - FOCUSED PHYSICAL EXAM:  unable to complete at this time.   Diet Order:   Diet Order            Diet Heart Room service appropriate? Yes; Fluid consistency: Thin  Diet effective now                 EDUCATION NEEDS:   No education needs have been identified at this time  Skin:  Skin Assessment: Reviewed RN Assessment  Last BM:  11/2  Height:   Ht Readings from Last 1 Encounters:  08/15/20 5\' 6"  (1.676 m)    Weight:   Wt Readings from Last 1 Encounters:  08/16/20 52.7 kg    Estimated Nutritional Needs:  Kcal: 1400-1600 kcal Protein:  65-80 grams Fluid:  >/= 1.7 L/day     13/03/21, MS, RD, LDN, CNSC Inpatient Clinical Dietitian RD pager # available in [facility]  After hours/weekend pager # available in [facility]

## 2020-08-16 NOTE — Progress Notes (Signed)
Patient ID: Crystal Espinoza, female   DOB: 1929-10-11, 86 y.o.   MRN: [id number]  PROGRESS NOTE    Crystal Espinoza  GGY:[id number] DOB: 17-Feb-1929 DOA: 08/15/2020 PCP: Merri Brunette, MD   Brief Narrative:  86 y.o. female with medical history significant for paroxysmal atrial fibrillation on Xarelto, seizure disorder, crest syndrome, and debility, presented to the emergency department with lethargy on 08/15/2020.  Patient lives alone with family checking on her frequently, saw her in her usual state a day prior to presentation, but then found her to be lethargic and confused on the day of presentation.  At her baseline, the patient reportedly ambulates with a walker and is fully oriented.  She had received COVID-19 vaccine a day prior to presentation.  In the ED, she was febrile to 38.5 C, tachypneic with stable blood pressure.  CBG was 34.  Lactic acid was elevated.  Chest x-ray was notable for stable cardiomegaly and mild vascular congestion.  CT of the abdomen and pelvis showed features suggestive of equivocal hyperemia of the prepyloric stomach which could reflect gastritis or PUD.  COVID-19 PCR is negative.  CT was also notable for groundglass opacities in the left lower lobe.  She was given IV fluids and started on broad-spectrum antibiotics.  Assessment & Plan:   Acute metabolic encephalopathy -Unclear cause.  Might be combination of hypoglycemia, fever. -Monitor mental status.  Will get CT of the brain without contrast. -Fall precautions.  Check B12, folic acid, TSH and ammonia levels in a.m.  Hypoglycemia -Unclear cause.  Probably from poor oral intake.  A1c 5.4.  CBG was 34 on arrival, treated with juice and IV dextrose.  Blood sugars improving. -Continue IV fluids with D5 normal saline at 75 cc an hour.  Severe sepsis: Present on admission Possible pneumonia -Patient presented with confusion, fever, tachypneic and CT of the abdomen showing groundglass opacities in the left lower  lobe. -IV fluids as above.  Currently on broad-spectrum antibiotics.  Follow cultures.  COVID-19 test was negative.  Abdominal pain -CT of the abdomen showed possible gastritis or PUD.  Continue PPI  Paroxysmal A. Fib -Patient apparently not taking Xarelto at home.  DC Xarelto.  Currently intermittently bradycardic.  Seizures -Continue Keppra.  Will check EEG as well -No evidence of obvious seizures  Hypothyroidism -Continue Synthroid  Hyponatremia -Monitor.  IV fluid plan as above  Leukopenia -Monitor  Thrombocytopenia -No signs of bleeding.  Monitor  Anemia of chronic disease -Questionable cause.  Monitor.  No signs of bleeding  Generalized deconditioning -PT eval.  Palliative care consult for goals of care discussion.   DVT prophylaxis: DC Xarelto.  Start SCDs Code Status: DNR Family Communication: None at bedside Disposition Plan: Status is: Inpatient  Remains inpatient appropriate because:Inpatient level of care appropriate due to severity of illness   Dispo: The patient is from: Home              Anticipated d/c is to: Home              Anticipated d/c date is: > 3 days              Patient currently is not medically stable to d/c.  Consultants: Consult palliative care  Procedures: None  Antimicrobials:  Anti-infectives (From admission, onward)   Start     Dose/Rate Route Frequency Ordered Stop   08/16/20 2200  vancomycin (VANCOREADY) IVPB 750 mg/150 mL        750 mg 150 mL/hr over 60  Minutes Intravenous Every 24 hours 08/15/20 2315     08/16/20 0600  ceFEPIme (MAXIPIME) 2 g in sodium chloride 0.9 % 100 mL IVPB        2 g 200 mL/hr over 30 Minutes Intravenous Every 12 hours 08/15/20 2315     08/16/20 0200  metroNIDAZOLE (FLAGYL) IVPB 500 mg        500 mg 100 mL/hr over 60 Minutes Intravenous Every 8 hours 08/15/20 2142     08/15/20 2145  vancomycin (VANCOCIN) IVPB 1000 mg/200 mL premix        1,000 mg 200 mL/hr over 60 Minutes Intravenous  Once  08/15/20 2142 08/16/20 0048   08/15/20 1730  ceFEPIme (MAXIPIME) 2 g in sodium chloride 0.9 % 100 mL IVPB        2 g 200 mL/hr over 30 Minutes Intravenous  Once 08/15/20 1727 08/15/20 1854   08/15/20 1730  metroNIDAZOLE (FLAGYL) IVPB 500 mg        500 mg 100 mL/hr over 60 Minutes Intravenous  Once 08/15/20 1727 08/15/20 1914       Subjective: Patient seen and examined at bedside.  Wakes up slightly, hardly answers any questions.  No overnight fever, vomiting, seizures reported by nursing staff.  Objective: Vitals:   08/16/20 0300 08/16/20 0355 08/16/20 0424 08/16/20 0810  BP: 134/62 119/60  (!) 109/55  Pulse: 63 (!) 59  60  Resp: Temp:  (!) 97.5 F (36.4 C)  (!) 97.4 F (36.3 C)  TempSrc:  Oral  Oral  SpO2: 99% 100%  91%  Weight:   52.7 kg   Height:        Intake/Output Summary (Last 24 hours) at 08/16/2020 1110 Last data filed at 08/16/2020 0900 Gross per 24 hour  Intake 100 ml  Output 500 ml  Net -400 ml   Filed Weights   08/15/20 1706 08/16/20 0424  Weight: 52.7 kg 52.7 kg    Examination:  General exam: Chronically ill; elderly female lying in bed. Respiratory system: Bilateral decreased breath sounds at bases with some scattered crackles Cardiovascular system: S1 & S2 heard; intermittently bradycardic Gastrointestinal system: Abdomen is nondistended, soft and nontender. Normal bowel sounds heard. Extremities: No cyanosis, clubbing; trace lower extremity edema present  Central nervous system: Wakes up slightly, hardly answers any questions.  Very poor historian.  Very slow to respond. No focal neurological deficits. Moving extremities Skin: Bilateral lower extremity erythema present.  No obvious petechiae/lesions  psychiatry: Couldn't be assessed because of mental status.    Data Reviewed: I have personally reviewed following labs and imaging studies  CBC: Recent Labs  Lab 08/15/20 1720 08/15/20 1736 08/16/20 0526  WBC 5.5  --  3.0*   NEUTROABS 4.6  --  2.2  HGB 11.7* 11.9* 10.9*  HCT 36.4 35.0* 33.0*  MCV 107.7*  --  105.8*  PLT 166  --  133*   Basic Metabolic Panel: Recent Labs  Lab 08/15/20 1720 08/15/20 1736 08/16/20 0526  NA 133* 133* 133*  K 4.3 4.1 4.1  CL 101 99 102  CO2 23  --  23  GLUCOSE 125* 122* 105*  BUN CREATININE 0.64 0.70 0.55  CALCIUM 8.8*  --  8.7*   GFR: Estimated Creatinine Clearance: 39.7 mL/min (by C-G formula based on SCr of 0.55 mg/dL). Liver Function Tests: Recent Labs  Lab 08/15/20 1720 08/16/20 0526  AST 14* 14*  ALT 10 7  ALKPHOS 123 104  BILITOT 1.2 1.2  PROT 7.1 5.9*  ALBUMIN 3.7 3.2*   No results for input(s): LIPASE, AMYLASE in the last 168 hours. No results for input(s): AMMONIA in the last 168 hours. Coagulation Profile: Recent Labs  Lab 08/15/20 2310  INR 1.1   Cardiac Enzymes: No results for input(s): CKTOTAL, CKMB, CKMBINDEX, TROPONINI in the last 168 hours. BNP (last 3 results) No results for input(s): PROBNP in the last 8760 hours. HbA1C: Recent Labs    08/15/20 2139  HGBA1C 5.4   CBG: Recent Labs  Lab 08/15/20 1708 08/15/20 1736 08/15/20 1740 08/15/20 1844 08/16/20 0004  GLUCAP 34* <10* 98 99 135*   Lipid Profile: No results for input(s): CHOL, HDL, LDLCALC, TRIG, CHOLHDL, LDLDIRECT in the last 72 hours. Thyroid Function Tests: Recent Labs    08/15/20 2310  TSH 2.472   Anemia Panel: No results for input(s): VITAMINB12, FOLATE, FERRITIN, TIBC, IRON, RETICCTPCT in the last 72 hours. Sepsis Labs: Recent Labs  Lab 08/15/20 1920 08/15/20 2310 08/16/20 0053 08/16/20 0526  PROCALCITON  --  <0.10  --  <0.10  LATICACIDVEN 3.9* 2.1* 2.4* 1.0    Recent Results (from the past 240 hour(s))  Culture, blood (routine x 2)     Status: None (Preliminary result)   Collection Time: 08/15/20  5:24 PM   Specimen: BLOOD RIGHT FOREARM  Result Value Ref Range Status   Specimen Description   Final    BLOOD RIGHT FOREARM Performed  at [facility], [address] [place], [place] [address]    Special Requests   Final    BOTTLES DRAWN AEROBIC AND ANAEROBIC Blood Culture adequate volume Performed at [facility], [address] [place], [place] [address]    Culture   Final    NO GROWTH < 12 HOURS Performed at [facility], [address] [place], [place] [address]    Report Status PENDING  Incomplete  Culture, blood (routine x 2)     Status: None (Preliminary result)   Collection Time: 08/15/20  5:25 PM   Specimen: Site Not Specified; Blood  Result Value Ref Range Status   Specimen Description   Final    SITE NOT SPECIFIED Performed at [facility], [address] [place], [place] [address]    Special Requests   Final    BOTTLES DRAWN AEROBIC AND ANAEROBIC Blood Culture adequate volume Performed at [facility], [address] [place], [place] [address]    Culture   Final    NO GROWTH < 12 HOURS Performed at [facility], [address] [place], [place] [address]    Report Status PENDING  Incomplete  Respiratory Panel by RT PCR (Flu A&B, Covid) - Nasopharyngeal Swab     Status: None   Collection Time: 08/15/20  5:44 PM   Specimen: Nasopharyngeal Swab  Result Value Ref Range Status   SARS Coronavirus 2 by RT PCR NEGATIVE NEGATIVE Final    Comment: (NOTE) SARS-CoV-2 target nucleic acids are NOT DETECTED.  The SARS-CoV-2 RNA is generally detectable in upper respiratoy specimens during the acute phase of infection. The lowest concentration of SARS-CoV-2 viral copies this assay can detect is 131 copies/mL. A negative result does not preclude SARS-Cov-2 infection and should not be used as the sole basis for treatment or other patient management decisions. A negative result may occur with  improper specimen collection/handling, submission of specimen other than nasopharyngeal swab, presence of viral mutation(s) within  the areas targeted by this assay, and inadequate number of viral copies (<131 copies/mL). A negative result must be combined with clinical observations, patient history, and epidemiological information. The expected result is Negative.  Fact Sheet for Patients:  https://www.moore.com/  Fact Sheet for Healthcare Providers:  https://www.young.biz/  This test is no t yet approved or cleared by the [place] FDA and  has been authorized for detection and/or diagnosis of SARS-CoV-2 by FDA under an Emergency Use Authorization (EUA). This EUA will remain  in effect (meaning this test can be used) for the duration of the COVID-19 declaration under Section 564(b)(1) of the Act, 21 U.S.C. section 360bbb-3(b)(1), unless the authorization is terminated or revoked sooner.     Influenza A by PCR NEGATIVE NEGATIVE Final   Influenza B by PCR NEGATIVE NEGATIVE Final    Comment: (NOTE) The Xpert Xpress SARS-CoV-2/FLU/RSV assay is intended as an aid in  the diagnosis of influenza from Nasopharyngeal swab specimens and  should not be used as a sole basis for treatment. Nasal washings and  aspirates are unacceptable for Xpert Xpress SARS-CoV-2/FLU/RSV  testing.  Fact Sheet for Patients: https://www.moore.com/  Fact Sheet for Healthcare Providers: https://www.young.biz/  This test is not yet approved or cleared by the [place] FDA and  has been authorized for detection and/or diagnosis of SARS-CoV-2 by  FDA under an Emergency Use Authorization (EUA). This EUA will remain  in effect (meaning this test can be used) for the duration of the  Covid-19 declaration under Section 564(b)(1) of the Act, 21  U.S.C. section 360bbb-3(b)(1), unless the authorization is  terminated or revoked. Performed at [facility], [address], [place], [place] [address]          Radiology Studies: CT  Abdomen Pelvis W Contrast  Result Date: 08/15/2020 CLINICAL DATA:  Abdominal pain and fever.  Lethargy. EXAM: CT ABDOMEN AND PELVIS WITH CONTRAST TECHNIQUE: Multidetector CT imaging of the abdomen and pelvis was performed using the standard protocol following bolus administration of intravenous contrast. CONTRAST:  OMNIPAQUE IOHEXOL 300 MG/ML  SOLN COMPARISON:  No prior exams available. FINDINGS: Lower chest: Multi chamber cardiomegaly. Pacemaker partially included. Improving motion artifact in the lung bases obscures detailed assessment, however there is suggestion of patchy ground-glass opacities in the left lower lobe. Hepatobiliary: No focal hepatic abnormality. Gallbladder is not well visualized, however decompressed or surgically absent. Normal caliber common bile duct, slight central intrahepatic biliary ductal dilatation. Pancreas: Fatty atrophy.  No ductal dilatation or inflammation. Spleen: Normal in size without focal abnormality. Adrenals/Urinary Tract: Adrenal thickening without dominant nodule. No hydronephrosis or perinephric edema. Mild motion artifact through the upper kidneys partially obscures detailed assessment. No obvious renal calculi. Homogeneous enhancement with symmetric excretion on delayed phase imaging. No bladder wall thickening. Stomach/Bowel: Bowel evaluation is limited in the absence of enteric contrast and paucity of intra-abdominal fat. Additionally there is motion artifact particularly in the upper abdomen. Stomach is partially distended. There is equivocal hyperemia of the pre pyloric stomach, for example series 2, image 39. No small bowel obstruction or inflammatory change. Scattered fecalization of small bowel contents. Wall thickening of the ascending colon versus nondistention. Moderate stool burden primarily in the left colon. There is sigmoid colonic redundancy. Scattered diverticulosis involving the descending and sigmoid colon without acute diverticulitis.  Vascular/Lymphatic: Aortic and branch atherosclerosis. No aortic aneurysm. Iliac vessels are tortuous. No bulky abdominopelvic adenopathy. Reproductive: Status post hysterectomy. No adnexal masses. Other: No ascites or free air. Small fat containing  umbilical hernia. No focal fluid collection. Musculoskeletal: Diffuse degenerative change throughout the spine and involving Espinoza hips. Bones are under mineralized. There are no acute or suspicious osseous abnormalities. IMPRESSION: 1. Equivocal hyperemia of the pre pyloric stomach, can be seen with gastritis or peptic ulcer disease. 2. Wall thickening of the ascending colon versus nondistention. 3. Colonic diverticulosis without diverticulitis. 4. Cardiomegaly. 5. Ground-glass opacities in the left lower lobe may be infectious or inflammatory, including COVID-19 pneumonia. Assessment is limited by breathing motion. Aortic Atherosclerosis (ICD10-I70.0). Electronically Signed   By: Narda Rutherford M.D.   On: 08/15/2020 19:46   DG Chest Port 1 View  Result Date: 08/15/2020 CLINICAL DATA:  Altered mental status. EXAM: PORTABLE CHEST 1 VIEW COMPARISON:  August 01, 2020. FINDINGS: Stable cardiomegaly with mild central pulmonary vascular congestion. Left-sided pacemaker is unchanged in position. No pneumothorax or pleural effusion is noted. Bony thorax is unremarkable. IMPRESSION: Stable cardiomegaly with mild central pulmonary vascular congestion. Aortic Atherosclerosis (ICD10-I70.0). Electronically Signed   By: Lupita Raider M.D.   On: 08/15/2020 18:45        Scheduled Meds: . levETIRAcetam  250 mg Oral BID  . levothyroxine  100 mcg Oral Q0600  . pantoprazole (PROTONIX) IV  40 mg Intravenous Q12H  . sodium chloride flush  3 mL Intravenous Q12H   Continuous Infusions: . ceFEPime (MAXIPIME) IV 2 g (08/16/20 0527)  . metronidazole 500 mg (08/16/20 0112)  . vancomycin            Glade Lloyd, MD [facility] 08/16/2020, 11:10 AM

## 2020-08-16 NOTE — Plan of Care (Signed)
  Problem: Education: Goal: Knowledge of disease or condition will improve Outcome: Progressing Goal: Understanding of medication regimen will improve Outcome: Progressing Goal: Individualized Educational Video(s) Outcome: Progressing   Problem: Activity: Goal: Ability to tolerate increased activity will improve Outcome: Progressing   Problem: Cardiac: Goal: Ability to achieve and maintain adequate cardiopulmonary perfusion will improve Outcome: Progressing   Problem: Health Behavior/Discharge Planning: Goal: Ability to safely manage health-related needs after discharge will improve Outcome: Progressing   Problem: Fluid Volume: Goal: Hemodynamic stability will improve Outcome: Progressing   Problem: Clinical Measurements: Goal: Diagnostic test results will improve Outcome: Progressing Goal: Signs and symptoms of infection will decrease Outcome: Progressing   Problem: Respiratory: Goal: Ability to maintain adequate ventilation will improve Outcome: Progressing

## 2020-08-17 DIAGNOSIS — R509 Fever, unspecified: Secondary | ICD-10-CM

## 2020-08-17 DIAGNOSIS — Z515 Encounter for palliative care: Secondary | ICD-10-CM

## 2020-08-17 DIAGNOSIS — Z7189 Other specified counseling: Secondary | ICD-10-CM

## 2020-08-17 LAB — COMPREHENSIVE METABOLIC PANEL
ALT: 9 U/L (ref 0–44)
AST: 14 U/L — ABNORMAL LOW (ref 15–41)
Albumin: 3.3 g/dL — ABNORMAL LOW (ref 3.5–5.0)
Alkaline Phosphatase: 106 U/L (ref 38–126)
Anion gap: 10 (ref 5–15)
BUN: 12 mg/dL (ref 8–23)
CO2: 20 mmol/L — ABNORMAL LOW (ref 22–32)
Calcium: 8.6 mg/dL — ABNORMAL LOW (ref 8.9–10.3)
Chloride: 101 mmol/L (ref 98–111)
Creatinine, Ser: 0.72 mg/dL (ref 0.44–1.00)
GFR, Estimated: >60 mL/min (ref >60)
Glucose, Bld: 93 mg/dL (ref 70–99)
Potassium: 5.4 mmol/L — ABNORMAL HIGH (ref 3.5–5.1)
Sodium: 131 mmol/L — ABNORMAL LOW (ref 135–145)
Total Bilirubin: 1 mg/dL (ref 0.3–1.2)
Total Protein: 6.4 g/dL — ABNORMAL LOW (ref 6.5–8.1)

## 2020-08-17 LAB — CBC WITH DIFFERENTIAL/PLATELET
Abs Immature Granulocytes: 0.04 10*3/uL (ref 0.00–0.07)
Basophils Absolute: 0 10*3/uL (ref 0.0–0.1)
Basophils Relative: 0 %
Eosinophils Absolute: 0 10*3/uL (ref 0.0–0.5)
Eosinophils Relative: 1 %
HCT: 35.8 % — ABNORMAL LOW (ref 36.0–46.0)
Hemoglobin: 11.3 g/dL — ABNORMAL LOW (ref 12.0–15.0)
Immature Granulocytes: 1 %
Lymphocytes Relative: 14 %
Lymphs Abs: 0.6 10*3/uL — ABNORMAL LOW (ref 0.7–4.0)
MCH: 34.9 pg — ABNORMAL HIGH (ref 26.0–34.0)
MCHC: 31.6 g/dL (ref 30.0–36.0)
MCV: 110.5 fL — ABNORMAL HIGH (ref 80.0–100.0)
Monocytes Absolute: 0.4 10*3/uL (ref 0.1–1.0)
Monocytes Relative: 9 %
Neutro Abs: 3.1 10*3/uL (ref 1.7–7.7)
Neutrophils Relative %: 75 %
Platelets: 142 10*3/uL — ABNORMAL LOW (ref 150–400)
RBC: 3.24 MIL/uL — ABNORMAL LOW (ref 3.87–5.11)
RDW: 17.6 % — ABNORMAL HIGH (ref 11.5–15.5)
WBC: 4.1 10*3/uL (ref 4.0–10.5)
nRBC: 0 % (ref 0.0–0.2)

## 2020-08-17 LAB — PROCALCITONIN: Procalcitonin: 0.13 ng/mL

## 2020-08-17 LAB — URINE CULTURE: Culture: >=100000 — AB

## 2020-08-17 LAB — GLUCOSE, CAPILLARY: Glucose-Capillary: <10 mg/dL — CL (ref 70–99)

## 2020-08-17 LAB — TSH: TSH: 5.277 u[IU]/mL — ABNORMAL HIGH (ref 0.350–4.500)

## 2020-08-17 LAB — AMMONIA: Ammonia: 15 umol/L (ref 9–35)

## 2020-08-17 LAB — VITAMIN B12: Vitamin B-12: 207 pg/mL (ref 180–914)

## 2020-08-17 LAB — FOLATE: Folate: 7.1 ng/mL (ref >5.9)

## 2020-08-17 MED ORDER — CYANOCOBALAMIN 1000 MCG/ML IJ SOLN
1000.0000 ug | Freq: Every day | INTRAMUSCULAR | Status: AC
  Administered 2020-08-17 – 2020-08-21 (×5): 1000 ug via INTRAMUSCULAR
  Filled 2020-08-17 (×5): qty 1

## 2020-08-17 MED ORDER — PANTOPRAZOLE SODIUM 40 MG PO TBEC
40.0000 mg | DELAYED_RELEASE_TABLET | Freq: Two times a day (BID) | ORAL | Status: DC
  Administered 2020-08-18 – 2020-08-21 (×5): 40 mg via ORAL
  Filled 2020-08-17 (×6): qty 1

## 2020-08-17 MED ORDER — SERTRALINE HCL 25 MG PO TABS
25.0000 mg | ORAL_TABLET | Freq: Every day | ORAL | Status: DC
  Administered 2020-08-17 – 2020-08-21 (×5): 25 mg via ORAL
  Filled 2020-08-17 (×5): qty 1

## 2020-08-17 MED ORDER — SODIUM CHLORIDE 0.9 % IV SOLN
3.0000 g | Freq: Three times a day (TID) | INTRAVENOUS | Status: DC
  Administered 2020-08-18: 3 g via INTRAVENOUS
  Filled 2020-08-17: qty 8
  Filled 2020-08-17: qty 3
  Filled 2020-08-17: qty 8

## 2020-08-17 NOTE — Progress Notes (Signed)
Palliative Additional nonface-to-face encounter  I met with patient's daughter-in-law Crystal Espinoza.  Crystal Espinoza is a physical therapist and it is patient's primary caretaker. Crystal Espinoza shares that patient's ability to care for herself has been in steep decline especially since her stroke in April and even more so after the death of her husband in early October. Crystal Espinoza stays at home by herself, she is incontinent, does not wear depends, and voids in the recliner where she spends most of her time.  Sometimes Crystal Espinoza is clear and oriented, but her clarity waxes and wanes. Goals of care were discussed and MOST form was reviewed.  For now they are hopeful for patient to discharge to a nursing facility and after that to an assisted living. However if patient were to show decline during this admission or during her stay at rehab then they would prefer to transition to comfort measures only and hospice. MOST form was redone with these goals of care in mind with selections of DNR, comfort measures only, determine use or limit of antibiotics at time of infection, no IV fluids, no feeding tube. We will make referral to social work for resources and outpatient palliative follow-up. Discussed starting patient on antidepressant while in hospital and daughter-in-law is in agreement with that.   , AGNP-C Palliative Medicine  Please call Palliative Medicine team phone with any questions [phone]. For individual providers please see AMION.  Total additional time: 78 minutes 

## 2020-08-17 NOTE — Progress Notes (Signed)
Patient ID: Crystal Espinoza, female   DOB: 11-15-1928, 86 y.o.   MRN: [id number]  PROGRESS NOTE    Crystal Espinoza  JJK:[id number] DOB: 19-Sep-1929 DOA: 08/15/2020 PCP: Merri Brunette, MD   Brief Narrative:  86 y.o. female with medical history significant for paroxysmal atrial fibrillation on Xarelto, seizure disorder, crest syndrome, and debility, presented to the emergency department with lethargy on 08/15/2020.  Patient lives alone with family checking on her frequently, saw her in her usual state a day prior to presentation, but then found her to be lethargic and confused on the day of presentation.  At her baseline, the patient reportedly ambulates with a walker and is fully oriented.  She had received COVID-19 vaccine a day prior to presentation.  In the ED, she was febrile to 38.5 C, tachypneic with stable blood pressure.  CBG was 34.  Lactic acid was elevated.  Chest x-ray was notable for stable cardiomegaly and mild vascular congestion.  CT of the abdomen and pelvis showed features suggestive of equivocal hyperemia of the prepyloric stomach which could reflect gastritis or PUD.  COVID-19 PCR is negative.  CT was also notable for groundglass opacities in the left lower lobe.  She was given IV fluids and started on broad-spectrum antibiotics.  Assessment & Plan:   Acute metabolic encephalopathy -Unclear cause.  Might be combination of hypoglycemia, fever. -Monitor mental status.  CT of the brain without contrast was negative for acute intracranial abnormality except for acute sphenoid sinusitis possibly.  EEG was negative for seizures. -Fall precautions.  B12, folic acid levels are pending.  Hypoglycemia -Unclear cause.  Probably from poor oral intake.  A1c 5.4.  CBG was 34 on arrival, treated with juice and IV dextrose.  Blood sugars improving. -Continue IV fluids with D5 normal saline at 75 cc an hour and encourage oral intake.  Severe sepsis: Present on admission Possible  pneumonia -Patient presented with confusion, fever, tachypneic and CT of the abdomen showing groundglass opacities in the left lower lobe. -IV fluids as above.  Currently on broad-spectrum antibiotics.  Blood cultures negative so far.  will switch antibiotics to Unasyn alone.  COVID-19 test was negative.  Possible acute sphenoid sinusitis -Switch antibiotics to Unasyn alone.  Abdominal pain -CT of the abdomen showed possible gastritis or PUD.  Continue PPI  Paroxysmal A. Fib -Patient apparently not taking Xarelto at home.  DC Xarelto.  Currently intermittently bradycardic.  Seizures -EEG negative for seizures. -No evidence of obvious seizures -Patient apparently does not take Keppra at home hence it was discontinued.  Hypothyroidism -Continue Synthroid  Hyponatremia -Monitor.  IV fluid plan as above.   Leukopenia -Improved  Thrombocytopenia -No signs of bleeding.  Monitor  Anemia of chronic disease -Questionable cause.  Monitor.  No signs of bleeding  Generalized deconditioning -PT eval.  Palliative care consult for goals of care discussion.   DVT prophylaxis: SCDs. Code Status: DNR Family Communication: None at bedside Disposition Plan: Status is: Inpatient  Remains inpatient appropriate because:Inpatient level of care appropriate due to severity of illness   Dispo: The patient is from: Home              Anticipated d/c is to: Home              Anticipated d/c date is: 1 day              Patient currently is not medically stable to d/c.  Consultants: palliative care  Procedures: None  Antimicrobials:  Anti-infectives (  From admission, onward)   Start     Dose/Rate Route Frequency Ordered Stop   08/16/20 2200  vancomycin (VANCOREADY) IVPB 750 mg/150 mL        750 mg 150 mL/hr over 60 Minutes Intravenous Every 24 hours 08/15/20 2315     08/16/20 0600  ceFEPIme (MAXIPIME) 2 g in sodium chloride 0.9 % 100 mL IVPB        2 g 200 mL/hr over 30 Minutes  Intravenous Every 12 hours 08/15/20 2315     08/16/20 0200  metroNIDAZOLE (FLAGYL) IVPB 500 mg  Status:  Discontinued        500 mg 100 mL/hr over 60 Minutes Intravenous Every 8 hours 08/15/20 2142 08/16/20 1136   08/15/20 2145  vancomycin (VANCOCIN) IVPB 1000 mg/200 mL premix        1,000 mg 200 mL/hr over 60 Minutes Intravenous  Once 08/15/20 2142 08/16/20 0048   08/15/20 1730  ceFEPIme (MAXIPIME) 2 g in sodium chloride 0.9 % 100 mL IVPB        2 g 200 mL/hr over 30 Minutes Intravenous  Once 08/15/20 1727 08/15/20 1854   08/15/20 1730  metroNIDAZOLE (FLAGYL) IVPB 500 mg        500 mg 100 mL/hr over 60 Minutes Intravenous  Once 08/15/20 1727 08/15/20 1914       Subjective: Patient seen and examined at bedside.  Poor historian.  No overnight fever, vomiting or seizures reported.  Awake, hardly answers any questions.  Talking sentences which do not make any sense. Objective: Vitals:   08/16/20 1600 08/16/20 2029 08/17/20 0500 08/17/20 0545  BP: 107/69 127/67  137/68  Pulse: 60 60  60  Resp: 14   16  Temp: 97.6 F (36.4 C) (!) 97.3 F (36.3 C)  98 F (36.7 C)  TempSrc: Axillary Oral  Axillary  SpO2: 98% 94%  98%  Weight:   54.5 kg   Height:        Intake/Output Summary (Last 24 hours) at 08/17/2020 0750 Last data filed at 08/17/2020 0600 Gross per 24 hour  Intake 841.69 ml  Output 500 ml  Net 341.69 ml   Filed Weights   08/15/20 1706 08/16/20 0424 08/17/20 0500  Weight: 52.7 kg 52.7 kg 54.5 kg    Examination:  General exam: No acute distress.  Chronically ill looking elderly female lying in bed. Respiratory system: Decreased breath sounds bilaterally with some scattered crackles.   Cardiovascular system: Currently rate controlled, S1-S2 heard Gastrointestinal system: Abdomen is nondistended, soft and nontender.  Bowel sounds heard  extremities: Mild lower extremity edema present.  No clubbing Central nervous system: Poor historian.  Awake, does not answer many  questions. Talking sentences which do not make any sense.  No focal neurological deficits.  Moves extremities  skin: Bilateral lower extremity erythema present.  No obvious ecchymosis/other rashes.  Fingers look slightly cyanotic. psychiatry: Cannot assess because of mental status    Data Reviewed: I have personally reviewed following labs and imaging studies  CBC: Recent Labs  Lab 08/15/20 1720 08/15/20 1736 08/16/20 0526  WBC 5.5  --  3.0*  NEUTROABS 4.6  --  2.2  HGB 11.7* 11.9* 10.9*  HCT 36.4 35.0* 33.0*  MCV 107.7*  --  105.8*  PLT 166  --  133*   Basic Metabolic Panel: Recent Labs  Lab 08/15/20 1720 08/15/20 1736 08/16/20 0526  NA 133* 133* 133*  K 4.3 4.1 4.1  CL 101 99 102  CO2 23  --  23  GLUCOSE 125* 122* 105*  BUN 13 12 10   CREATININE 0.64 0.70 0.55  CALCIUM 8.8*  --  8.7*   GFR: Estimated Creatinine Clearance: 41 mL/min (by C-G formula based on SCr of 0.55 mg/dL). Liver Function Tests: Recent Labs  Lab 08/15/20 1720 08/16/20 0526  AST 14* 14*  ALT 10 7  ALKPHOS 123 104  BILITOT 1.2 1.2  PROT 7.1 5.9*  ALBUMIN 3.7 3.2*   No results for input(s): LIPASE, AMYLASE in the last 168 hours. No results for input(s): AMMONIA in the last 168 hours. Coagulation Profile: Recent Labs  Lab 08/15/20 2310  INR 1.1   Cardiac Enzymes: No results for input(s): CKTOTAL, CKMB, CKMBINDEX, TROPONINI in the last 168 hours. BNP (last 3 results) No results for input(s): PROBNP in the last 8760 hours. HbA1C: Recent Labs    08/15/20 2139  HGBA1C 5.4   CBG: Recent Labs  Lab 08/15/20 1708 08/15/20 1736 08/15/20 1740 08/15/20 1844 08/16/20 0004  GLUCAP 34* <10* 98 99 135*   Lipid Profile: No results for input(s): CHOL, HDL, LDLCALC, TRIG, CHOLHDL, LDLDIRECT in the last 72 hours. Thyroid Function Tests: Recent Labs    08/15/20 2310  TSH 2.472   Anemia Panel: No results for input(s): VITAMINB12, FOLATE, FERRITIN, TIBC, IRON, RETICCTPCT in the last 72  hours. Sepsis Labs: Recent Labs  Lab 08/15/20 1920 08/15/20 2310 08/16/20 0053 08/16/20 0526  PROCALCITON  --  <0.10  --  <0.10  LATICACIDVEN 3.9* 2.1* 2.4* 1.0    Recent Results (from the past 240 hour(s))  Culture, blood (routine x 2)     Status: None (Preliminary result)   Collection Time: 08/15/20  5:24 PM   Specimen: BLOOD RIGHT FOREARM  Result Value Ref Range Status   Specimen Description   Final    BLOOD RIGHT FOREARM Performed at [facility], [address], [place], [place]    Special Requests   Final    BOTTLES DRAWN AEROBIC AND ANAEROBIC Blood Culture adequate volume Performed at [facility], [address], [place], [place]    Culture   Final    NO GROWTH 2 DAYS Performed at [facility], [address], [place], [place]    Report Status PENDING  Incomplete  Culture, blood (routine x 2)     Status: None (Preliminary result)   Collection Time: 08/15/20  5:25 PM   Specimen: Site Not Specified; Blood  Result Value Ref Range Status   Specimen Description   Final    SITE NOT SPECIFIED Performed at [facility], [address], [place], [place]    Special Requests   Final    BOTTLES DRAWN AEROBIC AND ANAEROBIC Blood Culture adequate volume Performed at [facility], [address], [place], [place]    Culture   Final    NO GROWTH 2 DAYS Performed at [facility], [address] [place], [place]    Report Status PENDING  Incomplete  Respiratory Panel by RT PCR (Flu A&B, Covid) - Nasopharyngeal Swab     Status: None   Collection Time: 08/15/20  5:44 PM   Specimen: Nasopharyngeal Swab  Result Value Ref Range Status   SARS Coronavirus 2 by RT PCR NEGATIVE NEGATIVE Final    Comment: (NOTE) SARS-CoV-2 target nucleic acids are NOT DETECTED.  The SARS-CoV-2 RNA is generally detectable in upper respiratoy specimens  during the acute phase  of infection. The lowest concentration of SARS-CoV-2 viral copies this assay can detect is 131 copies/mL. A negative result does not preclude SARS-Cov-2 infection and should not be used as the sole basis for treatment or other patient management decisions. A negative result may occur with  improper specimen collection/handling, submission of specimen other than nasopharyngeal swab, presence of viral mutation(s) within the areas targeted by this assay, and inadequate number of viral copies (<131 copies/mL). A negative result must be combined with clinical observations, patient history, and epidemiological information. The expected result is Negative.  Fact Sheet for Patients:  https://www.moore.com/https://www.fda.gov/media/142436/download  Fact Sheet for Healthcare Providers:  https://www.young.biz/https://www.fda.gov/media/142435/download  This test is no t yet approved or cleared by the [place] FDA and  has been authorized for detection and/or diagnosis of SARS-CoV-2 by FDA under an Emergency Use Authorization (EUA). This EUA will remain  in effect (meaning this test can be used) for the duration of the COVID-19 declaration under Section 564(b)(1) of the Act, 21 U.S.C. section 360bbb-3(b)(1), unless the authorization is terminated or revoked sooner.     Influenza A by PCR NEGATIVE NEGATIVE Final   Influenza B by PCR NEGATIVE NEGATIVE Final    Comment: (NOTE) The Xpert Xpress SARS-CoV-2/FLU/RSV assay is intended as an aid in  the diagnosis of influenza from Nasopharyngeal swab specimens and  should not be used as a sole basis for treatment. Nasal washings and  aspirates are unacceptable for Xpert Xpress SARS-CoV-2/FLU/RSV  testing.  Fact Sheet for Patients: https://www.moore.com/https://www.fda.gov/media/142436/download  Fact Sheet for Healthcare Providers: https://www.young.biz/https://www.fda.gov/media/142435/download  This test is not yet approved or cleared by the [place] FDA and  has been authorized for detection and/or  diagnosis of SARS-CoV-2 by  FDA under an Emergency Use Authorization (EUA). This EUA will remain  in effect (meaning this test can be used) for the duration of the  Covid-19 declaration under Section 564(b)(1) of the Act, 21  U.S.C. section 360bbb-3(b)(1), unless the authorization is  terminated or revoked. Performed at [facility], [address] [place], [place] [address]   Urine culture     Status: Abnormal (Preliminary result)   Collection Time: 08/15/20  6:15 PM   Specimen: Urine, Random  Result Value Ref Range Status   Specimen Description   Final    URINE, RANDOM Performed at [facility], [address] [place], [place] [address]    Special Requests   Final    NONE Performed at [facility], [address] [place], [place] [address]    Culture (A)  Final    >=100,000 COLONIES/mL GRAM NEGATIVE RODS IDENTIFICATION AND SUSCEPTIBILITIES TO FOLLOW Performed at [facility], [address] [place], [place] [address]    Report Status PENDING  Incomplete         Radiology Studies: EEG  Result Date: 08/16/2020 Charlsie QuestYadav, Priyanka O, MD     08/16/2020  2:08 PM Patient Name: Crystal Espinoza MRN: [id number] Epilepsy Attending: Charlsie QuestPriyanka O Yadav Referring Physician/Provider: Dr Glade LloydKshitiz Dalen [place] Date: 08/16/2020 Duration: 26.24 mins Patient history: 86 year old female with history of epilepsy who presented with altered mental status.  EEG to evaluate for seizures. Level of alertness: Awake AEDs during EEG study: Keppra Technical aspects: This EEG study was done with scalp electrodes positioned according to the 10-20 International system of electrode placement. Electrical activity was acquired at a sampling rate of 500Hz  and reviewed with a high frequency filter of 70Hz  and a low frequency filter of 1Hz . EEG  data were recorded continuously and digitally stored. Description: The posterior dominant rhythm consists of 8 Hz activity  of moderate voltage (25-35 uV) seen predominantly in posterior head regions, symmetric and reactive to eye opening and eye closing. EEG showed continuous generalized 3 to 6 Hz theta-delta slowing. Hyperventilation and photic stimulation were not performed.   ABNORMALITY -Continuous slow, generalized IMPRESSION: This study is suggestive of mild diffuse encephalopathy, nonspecific etiology.  No seizures or epileptiform discharges were seen throughout the recording. Priyanka Annabelle Harman   CT HEAD WO CONTRAST  Result Date: 08/16/2020 CLINICAL DATA:  Encephalopathy EXAM: CT HEAD WITHOUT CONTRAST TECHNIQUE: Contiguous axial images were obtained from the base of the skull through the vertex without intravenous contrast. COMPARISON:  June 05, 2020 FINDINGS: Brain: No evidence of acute infarction, hemorrhage, hydrocephalus, extra-axial collection or mass lesion/mass effect. Periventricular white matter hypodensities consistent with sequela of chronic microvascular ischemic disease. Global parenchymal volume loss. Vascular: Vascular calcifications. Skull: Normal. Negative for fracture or focal lesion. Sinuses/Orbits: Frothy fluid within the sphenoid sinuses. Other: None. IMPRESSION: 1. No acute intracranial findings. 2. Frothy fluid within the sphenoid sinuses which could reflect acute sinusitis in the appropriate clinical setting. Electronically Signed   By: Meda Klinefelter MD   On: 08/16/2020 15:35   CT Abdomen Pelvis W Contrast  Result Date: 08/15/2020 CLINICAL DATA:  Abdominal pain and fever.  Lethargy. EXAM: CT ABDOMEN AND PELVIS WITH CONTRAST TECHNIQUE: Multidetector CT imaging of the abdomen and pelvis was performed using the standard protocol following bolus administration of intravenous contrast. CONTRAST:  OMNIPAQUE IOHEXOL 300 MG/ML  SOLN COMPARISON:  No prior exams available. FINDINGS: Lower chest: Multi chamber cardiomegaly. Pacemaker partially included. Improving motion artifact in the lung bases  obscures detailed assessment, however there is suggestion of patchy ground-glass opacities in the left lower lobe. Hepatobiliary: No focal hepatic abnormality. Gallbladder is not well visualized, however decompressed or surgically absent. Normal caliber common bile duct, slight central intrahepatic biliary ductal dilatation. Pancreas: Fatty atrophy.  No ductal dilatation or inflammation. Spleen: Normal in size without focal abnormality. Adrenals/Urinary Tract: Adrenal thickening without dominant nodule. No hydronephrosis or perinephric edema. Mild motion artifact through the upper kidneys partially obscures detailed assessment. No obvious renal calculi. Homogeneous enhancement with symmetric excretion on delayed phase imaging. No bladder wall thickening. Stomach/Bowel: Bowel evaluation is limited in the absence of enteric contrast and paucity of intra-abdominal fat. Additionally there is motion artifact particularly in the upper abdomen. Stomach is partially distended. There is equivocal hyperemia of the pre pyloric stomach, for example series 2, image 39. No small bowel obstruction or inflammatory change. Scattered fecalization of small bowel contents. Wall thickening of the ascending colon versus nondistention. Moderate stool burden primarily in the left colon. There is sigmoid colonic redundancy. Scattered diverticulosis involving the descending and sigmoid colon without acute diverticulitis. Vascular/Lymphatic: Aortic and branch atherosclerosis. No aortic aneurysm. Iliac vessels are tortuous. No bulky abdominopelvic adenopathy. Reproductive: Status post hysterectomy. No adnexal masses. Other: No ascites or free air. Small fat containing umbilical hernia. No focal fluid collection. Musculoskeletal: Diffuse degenerative change throughout the spine and involving both hips. Bones are under mineralized. There are no acute or suspicious osseous abnormalities. IMPRESSION: 1. Equivocal hyperemia of the pre pyloric  stomach, can be seen with gastritis or peptic ulcer disease. 2. Wall thickening of the ascending colon versus nondistention. 3. Colonic diverticulosis without diverticulitis. 4. Cardiomegaly. 5. Ground-glass opacities in the left lower lobe may be infectious or inflammatory, including COVID-19 pneumonia. Assessment is limited by  breathing motion. Aortic Atherosclerosis (ICD10-I70.0). Electronically Signed   By: Narda Rutherford M.D.   On: 08/15/2020 19:46   DG Chest Port 1 View  Result Date: 08/15/2020 CLINICAL DATA:  Altered mental status. EXAM: PORTABLE CHEST 1 VIEW COMPARISON:  August 01, 2020. FINDINGS: Stable cardiomegaly with mild central pulmonary vascular congestion. Left-sided pacemaker is unchanged in position. No pneumothorax or pleural effusion is noted. Bony thorax is unremarkable. IMPRESSION: Stable cardiomegaly with mild central pulmonary vascular congestion. Aortic Atherosclerosis (ICD10-I70.0). Electronically Signed   By: Lupita Raider M.D.   On: 08/15/2020 18:45        Scheduled Meds: . lactose free nutrition  237 mL Oral BID BM  . levothyroxine  100 mcg Oral Q0600  . multivitamin with minerals  1 tablet Oral Daily  . pantoprazole (PROTONIX) IV  40 mg Intravenous Q12H  . sodium chloride flush  3 mL Intravenous Q12H   Continuous Infusions: . ceFEPime (MAXIPIME) IV 2 g (08/17/20 0520)  . dextrose 5 % and 0.9% NaCl 75 mL/hr at 08/16/20 1500  . vancomycin Stopped (08/16/20 2253)          Glade Lloyd, MD [facility] 08/17/2020, 7:50 AM

## 2020-08-17 NOTE — Progress Notes (Signed)
Pharmacy IV to PO conversion  The patient is receiving pantoprazole by the intravenous route.  Based on criteria approved by the Pharmacy and Therapeutics Committee and the Medical Executive Committee, the medication is being converted to the equivalent oral dose form.   No active GI bleeding or impaired absorption  Not s/p esophagectomy  Documented ability to take oral medications for > 24 hr  Plan to continue treatment for at least 1 day  If you have any questions about this conversion, please contact the Pharmacy Department (ext [phone]).  Thank you.  Bernadene Person, PharmD, BCPS [phone] 08/17/2020, 1:59 PM

## 2020-08-17 NOTE — Consult Note (Signed)
Consultation Note Date: 08/17/2020   Patient Name: Crystal Espinoza  DOB: [birth year]  MRN: [id number]  Age / Sex: 86 y.o., female  PCP: Deland Pretty, MD Referring Physician: Aline August, MD  Reason for Consultation: Establishing goals of care  HPI/Patient Profile: 86 y.o. female  with past medical history of CREST syndrome, paroxysmal atrial fibrillation on Xarelto, seizure disorder, pulmonary hypertension, pacemaker, admitted on 08/15/2020 with complaints of lethargy and increasing weakness.  Work-up revealed hyperglycemia, elevated lactic acid, x-ray showed mild vascular congestion, follow-up CT of the chest showed groundglass in the left lower lobe, CT of the abdomen and pelvis also showed possible gastritis versus PUD.  EKG did not reveal seizure activity only showed encephalopathy.  She is admitted and being treated for sepsis likely due to pneumonia.  Palliative medicine consulted for goals of care  Clinical Assessment and Goals of Care: Chart reviewed. Evaluated and met with patient at bedside.  She was sitting up in chair and had a most of her lunch.  She was able to correctly identify the name of the building-hospital, and year, not oriented to month or city.  She tells me that she is in the hospital because of her hands being purple and painful.  She is feeling much better than she did yesterday.  She does not appear to completely understand her health situation.  She cannot detail her overall medical problems.  She has difficulty answering my questions about her medical history and her current health.  Life review was discussed.  She was previously living at home with her husband however, he died recently.  Being here in the hospital is willing much grief and memories for her as this is where he also died.  She shares that she has been very lonely at home since losing her husband.  Was very tearful and  acknowledged that she has been depressed more days than not. She has 2 sons who check on her frequently.  Prior to admission she tells me that she was able to ambulate and complete her own ADLs.  We discussed that she is likely a little more weak than she was prior to coming in the hospital and inquired if she would be open to rehab at discharge.  She shares that she would not want to go to a nursing facility her goal would be to stay at home, however I am concerned about her capacity for decision-making at this point.  I called patient's son and was directed to call his sister-in-law patient's daughter-in-law Eevie Lapp who patient's sons defer decision-making to.   Primary Decision Maker NEXT OF KIN    SUMMARY OF RECOMMENDATIONS   - Continue current scope of care- per MOST on file- DNR, limited interventions, IV fluids, antibiotics ok, no feeding tube -I will plan to call Belenda Cruise in schedule further goals of care discussion -We will place consult for chaplain visit for grief support  Code Status/Advance Care Planning:  DNR    Symptom Management:   Would likely benefit from SSRI will  discuss with daughter in law   Psycho-social/Spiritual:   Desire for further Chaplaincy support:yes  Additional Recommendations: Caregiving  Support/Resources  Prognosis:    Unable to determine  Discharge Planning: To Be Determined  Primary Diagnoses: Present on Admission: . Acute encephalopathy . Paroxysmal atrial fibrillation (HCC) . Hypoglycemia without diagnosis of diabetes mellitus . Abdominal pain . Fever . Chronic systolic CHF (congestive heart failure) ([place])   I have reviewed the medical record, interviewed the patient and family, and examined the patient. The following aspects are pertinent.  Past Medical History:  Diagnosis Date  . Cataract   . CREST syndrome ([place])   . Hypertension   . Nausea   . Paroxysmal atrial fibrillation ([place]) 02/02/2017  . Severe  pulmonary hypertension ([place])    on cath 02/2017  . Symptomatic bradycardia 04/08/2017   Social History   Socioeconomic History  . Marital status: Married    Spouse name: Not on file  . Number of children: Not on file  . Years of education: Not on file  . Highest education level: Not on file  Occupational History  . Not on file  Tobacco Use  . Smoking status: Never Smoker  . Smokeless tobacco: Never Used  Vaping Use  . Vaping Use: Never used  Substance and Sexual Activity  . Alcohol use: No  . Drug use: No  . Sexual activity: Never  Other Topics Concern  . Not on file  Social History Narrative  . Not on file   Social Determinants of Health   Financial Resource Strain:   . Difficulty of Paying Living Expenses: Not on file  Food Insecurity:   . Worried About Charity fundraiser in the Last Year: Not on file  . Ran Out of Food in the Last Year: Not on file  Transportation Needs:   . Lack of Transportation (Medical): Not on file  . Lack of Transportation (Non-Medical): Not on file  Physical Activity:   . Days of Exercise per Week: Not on file  . Minutes of Exercise per Session: Not on file  Stress:   . Feeling of Stress : Not on file  Social Connections:   . Frequency of Communication with Friends and Family: Not on file  . Frequency of Social Gatherings with Friends and Family: Not on file  . Attends Religious Services: Not on file  . Active Member of Clubs or Organizations: Not on file  . Attends Archivist Meetings: Not on file  . Marital Status: Not on file   Scheduled Meds: . lactose free nutrition  237 mL Oral BID BM  . levothyroxine  100 mcg Oral Q0600  . multivitamin with minerals  1 tablet Oral Daily  . pantoprazole  40 mg Oral BID  . sodium chloride flush  3 mL Intravenous Q12H   Continuous Infusions: . ampicillin-sulbactam (UNASYN) IV    . dextrose 5 % and 0.9% NaCl 50 mL/hr at 08/17/20 1017   PRN Meds:.acetaminophen **OR** acetaminophen,  HYDROcodone-acetaminophen, morphine injection, ondansetron **OR** ondansetron (ZOFRAN) IV, senna-docusate Medications Prior to Admission:  Prior to Admission medications   Medication Sig Start Date End Date Taking? Authorizing Provider  amLODipine (NORVASC) 2.5 MG tablet Take 2.5 mg by mouth daily. 08/08/20  Yes [provider]  levothyroxine (SYNTHROID) 100 MCG tablet Take 100 mcg by mouth daily.    Yes [provider]  triamterene-hydrochlorothiazide (MAXZIDE-25) 37.5-25 MG tablet Take 0.5 tablets by mouth every morning. 08/08/20  Yes [provider]  Allergies  Allergen Reactions  . Levaquin [Levofloxacin] Other (See Comments)    unknown   Review of Systems  Physical Exam  Vital Signs: BP (!) 112/56 (BP Location: Right Arm)   Pulse 60   Temp 97.6 F (36.4 C) (Oral)   Resp 17   Ht 5' 6"  (1.676 m)   Wt 54.5 kg   SpO2 98%   BMI 19.39 kg/m  Pain Scale: 0-10 POSS *See Group Information*: S-Acceptable,Sleep, easy to arouse Pain Score: 0-No pain   SpO2: SpO2: 98 % O2 Device:SpO2: 98 % O2 Flow Rate: .   IO: Intake/output summary:   Intake/Output Summary (Last 24 hours) at 08/17/2020 1420 Last data filed at 08/17/2020 1000 Gross per 24 hour  Intake 881.47 ml  Output --  Net 881.47 ml    LBM: Last BM Date: 08/15/20 Baseline Weight: Weight: 52.7 kg Most recent weight: Weight: 54.5 kg     Palliative Assessment/Data: PPS: 30%     Thank you for this consult. Palliative medicine will continue to follow and assist as needed.   Time In: 1226 Time Out: 1332 Time Total: 66 mins Greater than 50%  of this time was spent counseling and coordinating care related to the above assessment and plan.  Signed by: Mariana Kaufman, AGNP-C Palliative Medicine    Please contact Palliative Medicine Team phone at [phone] for questions and concerns.  For individual provider: See Shea Evans

## 2020-08-17 NOTE — Evaluation (Signed)
Clinical/Bedside Swallow Evaluation Patient Details  Name: Crystal Espinoza MRN: [id number] Date of Birth: 12/09/1928  Today's Date: 08/17/2020 Time: SLP Start Time (ACUTE ONLY): 0800 SLP Stop Time (ACUTE ONLY): 0817 SLP Time Calculation (min) (ACUTE ONLY): 17 min  Past Medical History:  Past Medical History:  Diagnosis Date  . Cataract   . CREST syndrome (HCC)   . Hypertension   . Nausea   . Paroxysmal atrial fibrillation (HCC) 02/02/2017  . Severe pulmonary hypertension (HCC)    on cath 02/2017  . Symptomatic bradycardia 04/08/2017   Past Surgical History:  Past Surgical History:  Procedure Laterality Date  . ABDOMINAL HYSTERECTOMY    . EYE SURGERY    . PACEMAKER IMPLANT N/A 04/08/2017   Procedure: Pacemaker Implant;  Surgeon: Marinus Maw, MD;  Location: [facility] INVASIVE CV LAB;  Service: Cardiovascular;  Laterality: N/A;   HPI:  86 yo female adm to [facility] with AMS and lethargy, she had received COVID vaccine on 08/14/2020.  Pt with PMH + for Afib, seizure d/o, CREST syndrome, debility.  Imaging study suggestive of diffuse encephalopathy.  Per notes pt lives alone and family checks on her frequently.  Imaging study concerning for acute sinusitis due to frothy secretions retained.  Swallow evaluation ordered.  Pt denies dysphagia prior to admission.   Assessment / Plan / Recommendation Clinical Impression  Pt appears with intact cranial nerve function based on those assessed in which she would participate.  She is able to hold her own cup and drink water via straw.  Swallow appeared strong and timely with clear voice throughout intake.  She declined to consume any solids, despite multiple offerings.  Her articulation ability is intact indicative of intact lingual strength.  Recommend continue diet - ordering softer foods due to pt's lack of dentition.  Will follow up x1 due to limited participation and imaging findings to assure po tolerance.  Posted swallow precaution sign for pt in  room. SLP Visit Diagnosis: Dysphagia, unspecified (R13.10)    Aspiration Risk  Mild aspiration risk    Diet Recommendation Regular;Dysphagia 3 (Mech soft);Thin liquid (order softer foods)   Liquid Administration via: Cup;Straw Medication Administration: Whole meds with puree Compensations: Slow rate;Small sips/bites Postural Changes: Seated upright at 90 degrees;Remain upright for at least 30 minutes after po intake    Other  Recommendations Oral Care Recommendations: Oral care BID   Follow up Recommendations        Frequency and Duration min 1 x/week  1 week       Prognosis Prognosis for Safe Diet Advancement: Good      Swallow Study   General Date of Onset: 08/17/20 HPI: 86 yo female adm to [facility] with AMS and lethargy, she had received COVID vaccine on 08/14/2020.  Pt with PMH + for Afib, seizure d/o, CREST, debility.  Imaging study suggestive of diffuse encephalopathy.  Per notes pt lives alone and family checks on her frequently.  Imaging study concerning for acute sinusitis due to frothy secretions retained.  Swallow evaluation ordered.  Pt denies dysphagia prior to admission. Type of Study: Bedside Swallow Evaluation Previous Swallow Assessment: none in system Diet Prior to this Study: Regular;Thin liquids Temperature Spikes Noted: No Respiratory Status: Room air History of Recent Intubation: No Behavior/Cognition: Alert;Doesn't follow directions;Confused;Distractible Oral Cavity Assessment: Dry Oral Care Completed by SLP: No Oral Cavity - Dentition: Other (Comment) (few lower dentition only, uppers intact) Vision: Functional for self-feeding (able to hold her own cup) Self-Feeding Abilities: Needs set up Patient  Positioning: Upright in bed Baseline Vocal Quality: Hoarse Volitional Cough: Weak Volitional Swallow: Unable to elicit    Oral/Motor/Sensory Function Overall Oral Motor/Sensory Function: Within functional limits (from assessment items pt would follow)   Ice  Chips Ice chips: Not tested   Thin Liquid Thin Liquid: Within functional limits Presentation: Straw Other Comments: multiple swallows intermittently observed    Nectar Thick Nectar Thick Liquid: Not tested   Honey Thick Honey Thick Liquid: Not tested   Puree Puree: Not tested Other Comments: pt declined to consume any solids offered saying "My body is not ready to eat until 1030 or 1100"   Solid     Solid: Not tested Other Comments: pt declined to consume any solids offered saying "My body is not ready to eat until 1030 or 1100"      Chales Abrahams 08/17/2020,8:32 AM  Rolena Infante, MS [facility] SLP Acute Rehab Services Office [phone] Pager [phone]

## 2020-08-17 NOTE — Progress Notes (Signed)
Patient refusing all care. 3 RN's attempted to discuss importance of IV fluids and antibiotics with patient. Patient repeatedly states "do not touch me, you are not putting any of that in my body". IV fluids stopped and antibiotic not administered. Education provided and reinforced with patient. Charge nurse, pharmacy and MD made aware. RN will continue to monitor.  Tana Coast, RN

## 2020-08-17 NOTE — Evaluation (Signed)
Physical Therapy Evaluation Patient Details Name: Crystal Espinoza MRN: [id number] DOB: 18-Nov-1928 Today's Date: 08/17/2020   History of Present Illness  86 y.o. female with medical history significant for paroxysmal atrial fibrillation on Xarelto, seizure disorder, crest syndrome, HTN, pacemaker, presented to the emergency department with lethargy on 08/15/2020.  Patient lives alone with family checking on her frequently, saw her in her usual state a day prior to presentation, but then found her to be lethargic and confused on the day of presentation.  At her baseline, the patient reportedly ambulates with a walker and is fully oriented  Clinical Impression  Pt admitted with above diagnosis.  Pt currently with functional limitations due to the deficits listed below (see PT Problem List). Pt will benefit from skilled PT to increase their independence and safety with mobility to allow discharge to the venue listed below.   Pt agreeable to mobilize and assisted OOB to recliner.  Pt fatigued quickly and worried about falling.  Pt reports 3 falls this year.  Pt also states her husband passed away 2 weeks ago (uncertain of accuracy?)  Pt lives alone per chart review and would benefit from SNF upon d/c.  If not SNF, pt will likely require more care at home.     Follow Up Recommendations SNF;Supervision/Assistance - 24 hour    Equipment Recommendations  None recommended by PT    Recommendations for Other Services       Precautions / Restrictions Precautions Precautions: Fall      Mobility  Bed Mobility Overal bed mobility: Needs Assistance Bed Mobility: Supine to Sit     Supine to sit: Min assist;HOB elevated     General bed mobility comments: pt provided a hand and able to self assist better    Transfers Overall transfer level: Needs assistance Equipment used: Rolling walker (2 wheeled) Transfers: Sit to/from [employer] Sit to Stand: Min assist Stand pivot  transfers: Min assist       General transfer comment: verbal cues for UE positioning, assist to rise and steady  Ambulation/Gait             General Gait Details: deferred as pt fatigued quickly  Stairs            Wheelchair Mobility    Modified Rankin (Stroke Patients Only)       Balance Overall balance assessment: Needs assistance;History of Falls         Standing balance support: Bilateral upper extremity supported Standing balance-Leahy Scale: Poor Standing balance comment: reliant on UE support                             Pertinent Vitals/Pain Pain Assessment: No/denies pain    Home Living Family/patient expects to be discharged to:: Private residence Living Arrangements: Alone Available Help at Discharge: Family;Available 24 hours/day Type of Home: House       Home Layout: One level   Additional Comments: above per admission 6 months ago, pt poor historian    Prior Function Level of Independence: Needs assistance   Gait / Transfers Assistance Needed: husband reports "50/50" and " I just held her up"  ADL's / Homemaking Assistance Needed: "I did 50 percent" per husband  Comments: above per last admission 6 months ago however pt reports today that her husband passed away 2 weeks ago (?)     Hand Dominance        Extremity/Trunk Assessment  Lower Extremity Assessment Lower Extremity Assessment: Generalized weakness       Communication   Communication: HOH  Cognition Arousal/Alertness: Awake/alert Behavior During Therapy: WFL for tasks assessed/performed Overall Cognitive Status: Within Functional Limits for tasks assessed                                        General Comments General comments (skin integrity, edema, etc.): pt reports 3 falls within the last year    Exercises     Assessment/Plan    PT Assessment Patient needs continued PT services  PT Problem List Decreased  strength;Decreased mobility;Decreased activity tolerance;Decreased balance;Decreased knowledge of use of DME       PT Treatment Interventions DME instruction;Gait training;Balance training;Therapeutic exercise;Functional mobility training;Therapeutic activities;Patient/family education    PT Goals (Current goals can be found in the Care Plan section)  Acute Rehab PT Goals PT Goal Formulation: With patient Time For Goal Achievement: 08/31/20 Potential to Achieve Goals: Good    Frequency Min 2X/week   Barriers to discharge        Co-evaluation               AM-PAC PT "6 Clicks" Mobility  Outcome Measure Help needed turning from your back to your side while in a flat bed without using bedrails?: A Little Help needed moving from lying on your back to sitting on the side of a flat bed without using bedrails?: A Little Help needed moving to and from a bed to a chair (including a wheelchair)?: A Little Help needed standing up from a chair using your arms (e.g., wheelchair or bedside chair)?: A Little Help needed to walk in hospital room?: A Lot Help needed climbing 3-5 steps with a railing? : Total 6 Click Score: 15    End of Session Equipment Utilized During Treatment: Gait belt Activity Tolerance: Patient tolerated treatment well Patient left: with call bell/phone within reach;in chair;with chair alarm set Nurse Communication: Mobility status PT Visit Diagnosis: Other abnormalities of gait and mobility (R26.89);History of falling (Z91.81)    Time: 6734-1937 PT Time Calculation (min) (ACUTE ONLY): 21 min   Charges:   PT Evaluation $PT Eval Low Complexity: 1 Low     Kati PT, DPT Acute Rehabilitation Services Pager: [phone] Office: [phone]  Maida Sale E 08/17/2020, 1:21 PM

## 2020-08-18 DIAGNOSIS — I5022 Chronic systolic (congestive) heart failure: Secondary | ICD-10-CM

## 2020-08-18 MED ORDER — HYDROXYZINE HCL 50 MG/ML IM SOLN
25.0000 mg | Freq: Once | INTRAMUSCULAR | Status: DC
  Filled 2020-08-18: qty 0.5

## 2020-08-18 MED ORDER — AMOXICILLIN-POT CLAVULANATE 875-125 MG PO TABS
1.0000 | ORAL_TABLET | Freq: Two times a day (BID) | ORAL | Status: DC
  Administered 2020-08-18 – 2020-08-21 (×5): 1 via ORAL
  Filled 2020-08-18 (×5): qty 1

## 2020-08-18 NOTE — Progress Notes (Signed)
Chaplain visited patient upon learning that her husband had passed away from Covid here at [facility] a few weeks back.  She was receptive to visit.  She said she was there for testing and said her hands bothered her. Her husband Mikki Santee was originally from [place], [place] and stationed at [employer] when they met. They were married 17 years and had two sons.  "I miss him sooo much."  Chaplain offered ministry of presence and prayer. Rev. Tamsen Snider Pager [phone]

## 2020-08-18 NOTE — Progress Notes (Addendum)
Patient ID: KARISTA AISPURO, female   DOB: 06-23-1929, 86 y.o.   MRN: [id number]  PROGRESS NOTE    MELINDA GWINNER  FWY:[id number] DOB: 1929/03/28 DOA: 08/15/2020 PCP: Merri Brunette, MD   Brief Narrative:  86 y.o. female with medical history significant for paroxysmal atrial fibrillation on Xarelto, seizure disorder, crest syndrome, and debility, presented to the emergency department with lethargy on 08/15/2020.  Patient lives alone with family checking on her frequently, saw her in her usual state a day prior to presentation, but then found her to be lethargic and confused on the day of presentation.  At her baseline, the patient reportedly ambulates with a walker and is fully oriented.  She had received COVID-19 vaccine a day prior to presentation.  In the ED, she was febrile to 38.5 C, tachypneic with stable blood pressure.  CBG was 34.  Lactic acid was elevated.  Chest x-ray was notable for stable cardiomegaly and mild vascular congestion.  CT of the abdomen and pelvis showed features suggestive of equivocal hyperemia of the prepyloric stomach which could reflect gastritis or PUD.  COVID-19 PCR is negative.  CT was also notable for groundglass opacities in the left lower lobe.  She was given IV fluids and started on broad-spectrum antibiotics.  Assessment & Plan:   Acute metabolic encephalopathy Probable undiagnosed dementia -Unclear cause.  Might be combination of hypoglycemia, fever. -Monitor mental status.  CT of the brain without contrast was negative for acute intracranial abnormality except for acute sphenoid sinusitis possibly.  EEG was negative for seizures. -Fall precautions.   -Delirium precautions.   Probable B12 deficiency -B12 on the lower side of normal at 207.  Parenteral supplementation has been started during the hospitalization.  Will need oral supplementation on discharge.  Hypoglycemia -Unclear cause.  Probably from poor oral intake.  A1c 5.4.  CBG was 34 on arrival,  treated with juice and IV dextrose.  Blood sugars improving. -DC IV fluids; patient refused IV fluids last night.  Encourage oral intake.  Severe sepsis: Present on admission Possible pneumonia -Patient presented with confusion, fever, tachypneic and CT of the abdomen showing groundglass opacities in the left lower lobe. -IV fluids as above.  Blood cultures negative so far.  Currently on Unasyn but patient refused IV antibiotics last night.  Will switch to oral Augmentin.  COVID-19 test was negative.  Possible acute sphenoid sinusitis -Antibiotics line as above.  Abdominal pain -CT of the abdomen showed possible gastritis or PUD.  Continue PPI  Paroxysmal A. Fib -Patient apparently not taking Xarelto at home.  DC Xarelto.  Currently intermittently bradycardic.  Asymptomatic bacteriuria with ESBL E. Coli -No evidence of UTI on UA  Seizures -EEG negative for seizures. -No evidence of obvious seizures -Patient apparently does not take Keppra at home hence it was discontinued.  Hypothyroidism -Continue Synthroid  Hyponatremia -Monitor.  IV fluid plan as above.   Leukopenia -Improved.  No labs today.  Thrombocytopenia -No signs of bleeding.  Monitor  Anemia of chronic disease -Questionable cause.  Monitor.  No signs of bleeding  Generalized deconditioning -PT recommends SNF patient.  Social worker consult.  Palliative care following -Recommend hospice/comfort measures if condition worsens.  DVT prophylaxis: SCDs. Code Status: DNR Family Communication: None at bedside Disposition Plan: Status is: Inpatient  Remains inpatient appropriate because:Inpatient level of care appropriate due to severity of illness   Dispo: The patient is from: Home              Anticipated d/c  is to: SNF              Anticipated d/c date is: 1 day              Patient currently is medically stable to d/c.  Consultants: palliative care  Procedures: None  Antimicrobials:   Anti-infectives (From admission, onward)    Start     Dose/Rate Route Frequency Ordered Stop   08/17/20 2200  Ampicillin-Sulbactam (UNASYN) 3 g in sodium chloride 0.9 % 100 mL IVPB        3 g 200 mL/hr over 30 Minutes Intravenous Every 8 hours 08/17/20 0820     08/16/20 2200  vancomycin (VANCOREADY) IVPB 750 mg/150 mL  Status:  Discontinued        750 mg 150 mL/hr over 60 Minutes Intravenous Every 24 hours 08/15/20 2315 08/17/20 0757   08/16/20 0600  ceFEPIme (MAXIPIME) 2 g in sodium chloride 0.9 % 100 mL IVPB  Status:  Discontinued        2 g 200 mL/hr over 30 Minutes Intravenous Every 12 hours 08/15/20 2315 08/17/20 0757   08/16/20 0200  metroNIDAZOLE (FLAGYL) IVPB 500 mg  Status:  Discontinued        500 mg 100 mL/hr over 60 Minutes Intravenous Every 8 hours 08/15/20 2142 08/16/20 1136   08/15/20 2145  vancomycin (VANCOCIN) IVPB 1000 mg/200 mL premix        1,000 mg 200 mL/hr over 60 Minutes Intravenous  Once 08/15/20 2142 08/16/20 0048   08/15/20 1730  ceFEPIme (MAXIPIME) 2 g in sodium chloride 0.9 % 100 mL IVPB        2 g 200 mL/hr over 30 Minutes Intravenous  Once 08/15/20 1727 08/15/20 1854   08/15/20 1730  metroNIDAZOLE (FLAGYL) IVPB 500 mg        500 mg 100 mL/hr over 60 Minutes Intravenous  Once 08/15/20 1727 08/15/20 1914        Subjective: Patient seen and examined at bedside.  Extremely poor historian.  Does not participate in conversation much.  Nursing staff reports that patient refused IV fluids and antibiotics last night.  No overnight fever or vomiting reported.   Objective: Vitals:   08/17/20 0545 08/17/20 1330 08/17/20 1900 08/18/20 0600  BP: 137/68 (!) 112/56 (!) 143/69   Pulse: 60 60 62   Resp: Temp: 98 F (36.7 C) 97.6 F (36.4 C) (!) 97.4 F (36.3 C)   TempSrc: Axillary Oral Axillary   SpO2: 98% 98% 91%   Weight:      Height:        Intake/Output Summary (Last 24 hours) at 08/18/2020 0802 Last data filed at 08/18/2020 0300 Gross  per 24 hour  Intake 929.94 ml  Output 850 ml  Net 79.94 ml   Filed Weights   08/15/20 1706 08/16/20 0424 08/17/20 0500  Weight: 52.7 kg 52.7 kg 54.5 kg    Examination:  General exam: No distress.  Chronically ill looking elderly female lying in bed. Respiratory system: Bilateral decreased breath sounds at bases with some scattered crackles  cardiovascular system: S1-S2 heard, rate controlled Gastrointestinal system: Abdomen is nondistended, soft and nontender.  Normal bowel sounds heard  extremities: No clubbing.  Trace lower extremity edema present  Central nervous system: Extremely poor historian.  Awake, does not participate in conversation much.  No focal neurological deficits.  Moving extremities skin: Bilateral lower extremity erythema present.  No obvious petechiae and other lesions present.  Fingers look  slightly cyanotic. psychiatry: Could not be assessed because of mental status    Data Reviewed: I have personally reviewed following labs and imaging studies  CBC: Recent Labs  Lab 08/15/20 1720 08/15/20 1736 08/16/20 0526 08/17/20 0826  WBC 5.5  --  3.0* 4.1  NEUTROABS 4.6  --  2.2 3.1  HGB 11.7* 11.9* 10.9* 11.3*  HCT 36.4 35.0* 33.0* 35.8*  MCV 107.7*  --  105.8* 110.5*  PLT 166  --  133* 142*   Basic Metabolic Panel: Recent Labs  Lab 08/15/20 1720 08/15/20 1736 08/16/20 0526 08/17/20 0826  NA 133* 133* 133* 131*  K 4.3 4.1 4.1 5.4*  CL 101 99 102 101  CO2 23  --  23 20*  GLUCOSE 125* 122* 105* 93  BUN 13 12 10 12   CREATININE 0.64 0.70 0.55 0.72  CALCIUM 8.8*  --  8.7* 8.6*   GFR: Estimated Creatinine Clearance: 41 mL/min (by C-G formula based on SCr of 0.72 mg/dL). Liver Function Tests: Recent Labs  Lab 08/15/20 1720 08/16/20 0526 08/17/20 0826  AST 14* 14* 14*  ALT 10 7 9   ALKPHOS 123 104 106  BILITOT 1.2 1.2 1.0  PROT 7.1 5.9* 6.4*  ALBUMIN 3.7 3.2* 3.3*   No results for input(s): LIPASE, AMYLASE in the last 168 hours. Recent Labs   Lab 08/17/20 0826  AMMONIA 15   Coagulation Profile: Recent Labs  Lab 08/15/20 2310  INR 1.1   Cardiac Enzymes: No results for input(s): CKTOTAL, CKMB, CKMBINDEX, TROPONINI in the last 168 hours. BNP (last 3 results) No results for input(s): PROBNP in the last 8760 hours. HbA1C: Recent Labs    08/15/20 2139  HGBA1C 5.4   CBG: Recent Labs  Lab 08/15/20 1708 08/15/20 1736 08/15/20 1740 08/15/20 1844 08/16/20 0004  GLUCAP 34* <10*  <10* 98 99 135*   Lipid Profile: No results for input(s): CHOL, HDL, LDLCALC, TRIG, CHOLHDL, LDLDIRECT in the last 72 hours. Thyroid Function Tests: Recent Labs    08/17/20 0826  TSH 5.277*   Anemia Panel: Recent Labs    08/17/20 0826  VITAMINB12 207  FOLATE 7.1   Sepsis Labs: Recent Labs  Lab 08/15/20 1920 08/15/20 2310 08/16/20 0053 08/16/20 0526 08/17/20 0826  PROCALCITON  --  <0.10  --  <0.10 0.13  LATICACIDVEN 3.9* 2.1* 2.4* 1.0  --     Recent Results (from the past 240 hour(s))  Culture, blood (routine x 2)     Status: None (Preliminary result)   Collection Time: 08/15/20  5:24 PM   Specimen: BLOOD RIGHT FOREARM  Result Value Ref Range Status   Specimen Description   Final    BLOOD RIGHT FOREARM Performed at [facility], [address] [place], [place] [address]    Special Requests   Final    BOTTLES DRAWN AEROBIC AND ANAEROBIC Blood Culture adequate volume Performed at [facility], [address] [place], [place] [address]    Culture   Final    NO GROWTH 3 DAYS Performed at [facility], [address] [place], [place] [address]    Report Status PENDING  Incomplete  Culture, blood (routine x 2)     Status: None (Preliminary result)   Collection Time: 08/15/20  5:25 PM   Specimen: Site Not Specified; Blood  Result Value Ref Range Status   Specimen Description   Final    SITE NOT SPECIFIED Performed at [facility], [address]  [place], [place] [address]    Special Requests   Final    BOTTLES DRAWN AEROBIC AND ANAEROBIC Blood Culture adequate volume Performed at [facility], [address] [place], [place] [address]    Culture   Final    NO GROWTH 3 DAYS Performed at [facility], [address] [place], [place] [address]    Report Status PENDING  Incomplete  Respiratory Panel by RT PCR (Flu A&B, Covid) - Nasopharyngeal Swab     Status: None   Collection Time: 08/15/20  5:44 PM   Specimen: Nasopharyngeal Swab  Result Value Ref Range Status   SARS Coronavirus 2 by RT PCR NEGATIVE NEGATIVE Final    Comment: (NOTE) SARS-CoV-2 target nucleic acids are NOT DETECTED.  The SARS-CoV-2 RNA is generally detectable in upper respiratoy specimens during the acute phase of infection. The lowest concentration of SARS-CoV-2 viral copies this assay can detect is 131 copies/mL. A negative result does not preclude SARS-Cov-2 infection and should not be used as the sole basis for treatment or other patient management decisions. A negative result may occur with  improper specimen collection/handling, submission of specimen other than nasopharyngeal swab, presence of viral mutation(s) within the areas targeted by this assay, and inadequate number of viral copies (<131 copies/mL). A negative result must be combined with clinical observations, patient history, and epidemiological information. The expected result is Negative.  Fact Sheet for Patients:  https://www.moore.com/  Fact Sheet for Healthcare Providers:  https://www.young.biz/  This test is no t yet approved or cleared by the [place] FDA and  has been authorized for detection and/or diagnosis of SARS-CoV-2 by FDA under an Emergency Use Authorization (EUA). This EUA will remain  in effect (meaning this test can be used) for the duration of the COVID-19 declaration under Section 564(b)(1) of the Act,  21 U.S.C. section 360bbb-3(b)(1), unless the authorization is terminated or revoked sooner.     Influenza A by PCR NEGATIVE NEGATIVE Final   Influenza B by PCR NEGATIVE NEGATIVE Final    Comment: (NOTE) The Xpert Xpress SARS-CoV-2/FLU/RSV assay is intended as an aid in  the diagnosis of influenza from Nasopharyngeal swab specimens and  should not be used as a sole basis for treatment. Nasal washings and  aspirates are unacceptable for Xpert Xpress SARS-CoV-2/FLU/RSV  testing.  Fact Sheet for Patients: https://www.moore.com/  Fact Sheet for Healthcare Providers: https://www.young.biz/  This test is not yet approved or cleared by the [place] FDA and  has been authorized for detection and/or diagnosis of SARS-CoV-2 by  FDA under an Emergency Use Authorization (EUA). This EUA will remain  in effect (meaning this test can be used) for the duration of the  Covid-19 declaration under Section 564(b)(1) of the Act, 21  U.S.C. section 360bbb-3(b)(1), unless the authorization is  terminated or revoked. Performed at [facility], [address] [place], [place] [address]   Urine culture     Status: Abnormal   Collection Time: 08/15/20  6:15 PM   Specimen: Urine, Random  Result Value Ref Range Status   Specimen Description   Final    URINE, RANDOM Performed at [facility], [address], [place], [place] [address]    Special Requests   Final    NONE Performed at [facility], [address] [place], [place] [address]    Culture (A)  Final    >=100,000 COLONIES/mL ESCHERICHIA COLI Confirmed Extended Spectrum Beta-Lactamase Producer (ESBL).  In  bloodstream infections from ESBL organisms, carbapenems are preferred over piperacillin/tazobactam. They are shown to have a lower risk of mortality.    Report Status 08/17/2020 FINAL  Final   Organism ID, Bacteria ESCHERICHIA COLI (A)   Final      Susceptibility   Escherichia coli - MIC*    AMPICILLIN >=32 RESISTANT Resistant     CEFAZOLIN >=64 RESISTANT Resistant     CEFEPIME 16 RESISTANT Resistant     CEFTRIAXONE 32 RESISTANT Resistant     CIPROFLOXACIN >=4 RESISTANT Resistant     GENTAMICIN <=1 SENSITIVE Sensitive     IMIPENEM <=0.25 SENSITIVE Sensitive     NITROFURANTOIN <=16 SENSITIVE Sensitive     TRIMETH/SULFA >=320 RESISTANT Resistant     AMPICILLIN/SULBACTAM >=32 RESISTANT Resistant     PIP/TAZO 8 SENSITIVE Sensitive     * >=100,000 COLONIES/mL ESCHERICHIA COLI         Radiology Studies: EEG  Result Date: 08/16/2020 Charlsie Quest, MD     08/16/2020  2:08 PM Patient Name: SAIMA MONTERROSO MRN: [id number] Epilepsy Attending: Charlsie Quest Referring Physician/Provider: Dr Glade Lloyd Date: 08/16/2020 Duration: 26.24 mins Patient history: 86 year old female with history of epilepsy who presented with altered mental status.  EEG to evaluate for seizures. Level of alertness: Awake AEDs during EEG study: Keppra Technical aspects: This EEG study was done with scalp electrodes positioned according to the 10-20 International system of electrode placement. Electrical activity was acquired at a sampling rate of 500Hz  and reviewed with a high frequency filter of 70Hz  and a low frequency filter of 1Hz . EEG data were recorded continuously and digitally stored. Description: The posterior dominant rhythm consists of 8 Hz activity of moderate voltage (25-35 uV) seen predominantly in posterior head regions, symmetric and reactive to eye opening and eye closing. EEG showed continuous generalized 3 to 6 Hz theta-delta slowing. Hyperventilation and photic stimulation were not performed.   ABNORMALITY -Continuous slow, generalized IMPRESSION: This study is suggestive of mild diffuse encephalopathy, nonspecific etiology.  No seizures or epileptiform discharges were seen throughout the recording. Priyanka   CT HEAD WO  CONTRAST  Result Date: 08/16/2020 CLINICAL DATA:  Encephalopathy EXAM: CT HEAD WITHOUT CONTRAST TECHNIQUE: Contiguous axial images were obtained from the base of the skull through the vertex without intravenous contrast. COMPARISON:  June 05, 2020 FINDINGS: Brain: No evidence of acute infarction, hemorrhage, hydrocephalus, extra-axial collection or mass lesion/mass effect. Periventricular white matter hypodensities consistent with sequela of chronic microvascular ischemic disease. Global parenchymal volume loss. Vascular: Vascular calcifications. Skull: Normal. Negative for fracture or focal lesion. Sinuses/Orbits: Frothy fluid within the sphenoid sinuses. Other: None. IMPRESSION: 1. No acute intracranial findings. 2. Frothy fluid within the sphenoid sinuses which could reflect acute sinusitis in the appropriate clinical setting. Electronically Signed   By: Annabelle Harman MD   On: 08/16/2020 15:35        Scheduled Meds:  cyanocobalamin  1,000 mcg Intramuscular Daily   lactose free nutrition  237 mL Oral BID BM   levothyroxine  100 mcg Oral Q0600   multivitamin with minerals  1 tablet Oral Daily   pantoprazole  40 mg Oral BID   sertraline  25 mg Oral Daily   sodium chloride flush  3 mL Intravenous Q12H   Continuous Infusions:  ampicillin-sulbactam (UNASYN) IV 200 mL/hr at 08/18/20 0133   dextrose 5 % and 0.9% NaCl Stopped (08/17/20 2240)          13/12/2019, MD [facility] 08/18/2020, 8:02  AM

## 2020-08-18 NOTE — TOC Initial Note (Addendum)
Transition of Care [facility]) - Initial/Assessment Note    Patient Details  Name: Crystal Espinoza MRN: [id number] Date of Birth: 20-Mar-1929  Transition of Care [facility] CM/SW Contact:    Lanier Clam, RN Phone Number: 08/18/2020, 3:59 PM  Clinical Narrative: patient a+0x2.Spoke to dtr in Social worker Katherine-agree to SNF-faxed out await bed offers, then start auth.Pleae send bed offers to [facility] email address if possible:[email or web address]. [facility] Care following otpt-PCS.                 Expected Discharge Plan: Skilled Nursing Facility Barriers to Discharge: SNF Pending bed offer, Insurance Authorization   Patient Goals and CMS Choice Patient states their goals for this hospitalization and ongoing recovery are:: go to rehab CMS Medicare.gov Compare Post Acute Care list provided to:: Patient Represenative (must comment) Choice offered to / list presented to : Adult Children  Expected Discharge Plan and Services Expected Discharge Plan: Skilled Nursing Facility   Discharge Planning Services: CM Consult Post Acute Care Choice: Skilled Nursing Facility Living arrangements for the past 2 months: Single Family Home                                      Prior Living Arrangements/Services Living arrangements for the past 2 months: Single Family Home Lives with:: Self Patient language and need for interpreter reviewed:: Yes Do you feel safe going back to the place where you live?: Yes      Need for Family Participation in Patient Care: No (Comment) Care giver support system in place?: Yes (comment)   Criminal Activity/Legal Involvement Pertinent to Current Situation/Hospitalization: No - Comment as needed  Activities of Daily Living Home Assistive Devices/Equipment: Eyeglasses (pt unable to verbalize any other equipment she uses) ADL Screening (condition at time of admission) Patient's cognitive ability adequate to safely complete daily activities?: No Is the patient deaf or  have difficulty hearing?: Yes Does the patient have difficulty seeing, even when wearing glasses/contacts?: No Does the patient have difficulty concentrating, remembering, or making decisions?: Yes Patient able to express need for assistance with ADLs?: Yes Does the patient have difficulty dressing or bathing?: No Independently performs ADLs?: No Communication: Independent Dressing (OT): Independent Grooming: Independent Feeding: Independent Bathing: Needs assistance Is this a change from baseline?: Pre-admission baseline Toileting: Needs assistance Is this a change from baseline?: Pre-admission baseline In/Out Bed: Needs assistance Is this a change from baseline?: Pre-admission baseline Walks in Home: Needs assistance Is this a change from baseline?: Pre-admission baseline Does the patient have difficulty walking or climbing stairs?: Yes Weakness of Legs: Both Weakness of Arms/Hands: Both  Permission Sought/Granted Permission sought to share information with : Case Manager Permission granted to share information with : Yes, Verbal Permission Granted  Share Information with NAME: Care Manager     Permission granted to share info w Relationship: Natalia Leatherwood dtr in law [phone]     Emotional Assessment Appearance:: Appears stated age Attitude/Demeanor/Rapport: Gracious Affect (typically observed): Accepting Orientation: : Oriented to Self, Oriented to Place Alcohol / Substance Use: Not Applicable Psych Involvement: No (comment)  Admission diagnosis:  Acute encephalopathy [G93.40] Febrile illness, acute [R50.9] Patient Active Problem List   Diagnosis Date Noted  . Advanced care planning/counseling discussion   . Goals of care, counseling/discussion   . Palliative care by specialist   . Abdominal pain 08/15/2020  . Fever 08/15/2020  . Chronic systolic CHF (congestive heart failure) (HCC) 08/15/2020  .  Hypoglycemia without diagnosis of diabetes mellitus 06/05/2020  .  Encephalopathy 06/05/2020  . Acute encephalopathy 06/04/2020  . Seizure (HCC)   . CVA (cerebral vascular accident) (HCC) 01/20/2020  . AMS (altered mental status) 01/20/2020  . SOB (shortness of breath)   . Pacemaker 06/30/2019  . Symptomatic bradycardia 04/08/2017  . Paroxysmal atrial fibrillation (HCC) 02/02/2017  . RBBB 02/27/2014  . PAC (premature atrial contraction) 02/27/2014  . CREST syndrome (HCC) 02/27/2014   PCP:  Merri Brunette, MD Pharmacy:   [facility] [phone] [place], [place] - [phone] AT [facility] [address] [place] [address] Phone: [phone] Fax: [phone]     Social Determinants of Health (SDOH) Interventions    Readmission Risk Interventions No flowsheet data found.

## 2020-08-18 NOTE — [place] FL2 (Signed)
[place] MEDICAID FL2 LEVEL OF CARE SCREENING TOOL     IDENTIFICATION  Patient Name: Crystal Espinoza Birthdate: 06/01/1929 Sex: female Admission Date (Current Location): 08/15/2020  [facility] and [place] Number:  Producer, television/film/video and Address:  [facility], [place],  [address], [place] [address]      Provider Number: [id number]  Attending Physician Name and Address:  Glade Lloyd, MD  Relative Name and Phone Number:  Natalia Leatherwood Guillermo(dtr in law) [phone]    Current Level of Care: Hospital Recommended Level of Care: Skilled Nursing Facility Prior Approval Number:    Date Approved/Denied:   PASRR Number: [id number]  Discharge Plan: SNF    Current Diagnoses: Patient Active Problem List   Diagnosis Date Noted  . Advanced care planning/counseling discussion   . Goals of care, counseling/discussion   . Palliative care by specialist   . Abdominal pain 08/15/2020  . Fever 08/15/2020  . Chronic systolic CHF (congestive heart failure) (HCC) 08/15/2020  . Hypoglycemia without diagnosis of diabetes mellitus 06/05/2020  . Encephalopathy 06/05/2020  . Acute encephalopathy 06/04/2020  . Seizure (HCC)   . CVA (cerebral vascular accident) (HCC) 01/20/2020  . AMS (altered mental status) 01/20/2020  . SOB (shortness of breath)   . Pacemaker 06/30/2019  . Symptomatic bradycardia 04/08/2017  . Paroxysmal atrial fibrillation (HCC) 02/02/2017  . RBBB 02/27/2014  . PAC (premature atrial contraction) 02/27/2014  . CREST syndrome (HCC) 02/27/2014    Orientation RESPIRATION BLADDER Height & Weight     Self, Place  Normal Incontinent Weight:  (Patient refused) Height:  5\' 6"  (167.6 cm)  BEHAVIORAL SYMPTOMS/MOOD NEUROLOGICAL BOWEL NUTRITION STATUS    Convulsions/Seizures Incontinent Diet (dysphagia 3 diet)  AMBULATORY STATUS COMMUNICATION OF NEEDS Skin   Limited Assist Verbally Normal                       Personal Care Assistance Level of Assistance   Bathing, Feeding, Dressing Bathing Assistance: Limited assistance Feeding assistance: Limited assistance Dressing Assistance: Limited assistance     Functional Limitations Info  Sight, Hearing, Speech Sight Info: Impaired (eyeglasses) Hearing Info: Impaired (HOH) Speech Info: Adequate    SPECIAL CARE FACTORS FREQUENCY  PT (By licensed PT), OT (By licensed OT)     PT Frequency: 5x week OT Frequency: 5x week            Contractures Contractures Info: Not present    Additional Factors Info  Code Status, Allergies, Psychotropic (Zoloft) Code Status Info:  (DNR) Allergies Info:  (Levaquin) Psychotropic Info:  (Zoloft-see MAR)         Current Medications (08/18/2020):  This is the current hospital active medication list Current Facility-Administered Medications  Medication Dose Route Frequency Provider Last Rate Last Admin  . acetaminophen (TYLENOL) tablet 650 mg  650 mg Oral Q6H PRN Opyd, 13/02/2020, MD       Or  . acetaminophen (TYLENOL) suppository 650 mg  650 mg Rectal Q6H PRN Opyd, Lavone Neri, MD      . amoxicillin-clavulanate (AUGMENTIN) 875-125 MG per tablet 1 tablet  1 tablet Oral Q12H Alekh, Kshitiz, MD      . cyanocobalamin ((VITAMIN B-12)) injection 1,000 mcg  1,000 mcg Intramuscular Daily Lavone Neri, MD   1,000 mcg at 08/18/20 0853  . HYDROcodone-acetaminophen (NORCO/VICODIN) 5-325 MG per tablet 1-2 tablet  1-2 tablet Oral Q6H PRN 13/05/21, MD   1 tablet at 08/17/20 2030  . lactose free nutrition (BOOST PLUS) liquid 237  mL  237 mL Oral BID BM Glade Lloyd, MD   237 mL at 08/18/20 0853  . levothyroxine (SYNTHROID) tablet 100 mcg  100 mcg Oral Q0600 Briscoe Deutscher, MD   100 mcg at 08/16/20 0524  . morphine 2 MG/ML injection 1 mg  1 mg Intravenous Q2H PRN Glade Lloyd, MD   1 mg at 08/17/20 1558  . multivitamin with minerals tablet 1 tablet  1 tablet Oral Daily Glade Lloyd, MD   1 tablet at 08/18/20 0852  . ondansetron (ZOFRAN) tablet 4 mg  4 mg Oral  Q6H PRN Opyd, Lavone Neri, MD       Or  . ondansetron (ZOFRAN) injection 4 mg  4 mg Intravenous Q6H PRN Opyd, Lavone Neri, MD      . pantoprazole (PROTONIX) EC tablet 40 mg  40 mg Oral BID Danford Bad, RPH   40 mg at 08/18/20 0852  . senna-docusate (Senokot-S) tablet 1 tablet  1 tablet Oral QHS PRN Opyd, Lavone Neri, MD      . sertraline (ZOLOFT) tablet 25 mg  25 mg Oral Daily Barbara Cower, NP   25 mg at 08/18/20 0853  . sodium chloride flush (NS) 0.9 % injection 3 mL  3 mL Intravenous Q12H Opyd, Lavone Neri, MD   3 mL at 08/17/20 2255     Discharge Medications: Please see discharge summary for a list of discharge medications.  Relevant Imaging Results:  Relevant Lab Results:   Additional Information SSN [id number]  Lanier Clam, RN

## 2020-08-19 NOTE — Progress Notes (Signed)
Patient ID: ROSANGELICA PEVEHOUSE, female   DOB: 11-Mar-1929, 86 y.o.   MRN: [id number]  PROGRESS NOTE    VALLERIE HENTZ  YQM:[id number] DOB: 01-31-1929 DOA: 08/15/2020 PCP: Merri Brunette, MD   Brief Narrative:  86 y.o. female with medical history significant for paroxysmal atrial fibrillation on Xarelto, seizure disorder, crest syndrome, and debility, presented to the emergency department with lethargy on 08/15/2020.  Patient lives alone with family checking on her frequently, saw her in her usual state a day prior to presentation, but then found her to be lethargic and confused on the day of presentation.  At her baseline, the patient reportedly ambulates with a walker and is fully oriented.  She had received COVID-19 vaccine a day prior to presentation.  In the ED, she was febrile to 38.5 C, tachypneic with stable blood pressure.  CBG was 34.  Lactic acid was elevated.  Chest x-ray was notable for stable cardiomegaly and mild vascular congestion.  CT of the abdomen and pelvis showed features suggestive of equivocal hyperemia of the prepyloric stomach which could reflect gastritis or PUD.  COVID-19 PCR is negative.  CT was also notable for groundglass opacities in the left lower lobe.  She was given IV fluids and started on broad-spectrum antibiotics.  Assessment & Plan:   Acute metabolic encephalopathy Probable undiagnosed dementia -Unclear cause.  Might be combination of hypoglycemia, fever. -Monitor mental status.  CT of the brain without contrast was negative for acute intracranial abnormality except for acute sphenoid sinusitis possibly.  EEG was negative for seizures. -Fall precautions.   -Delirium precautions.   Probable B12 deficiency -B12 on the lower side of normal at 207.  Parenteral supplementation has been started during the hospitalization.  Will need oral supplementation on discharge.  Hypoglycemia -Unclear cause.  Probably from poor oral intake.  A1c 5.4.  CBG was 34 on arrival,  treated with juice and IV dextrose.  Blood sugars improving. -DC IV fluids; patient refused IV fluids last night.  Encourage oral intake.  Severe sepsis: Present on admission Possible pneumonia -Patient presented with confusion, fever, tachypneic and CT of the abdomen showing groundglass opacities in the left lower lobe. -IV fluids as above.  Blood cultures negative so far.  Unasyn has been switched to oral Augmentin.  Complete 7-day course of therapy.  COVID-19 test was negative.  Possible acute sphenoid sinusitis -Antibiotics line as above.  Abdominal pain -CT of the abdomen showed possible gastritis or PUD.  Continue PPI  Paroxysmal A. Fib -Patient apparently not taking Xarelto at home.  DC'd Xarelto.   Asymptomatic bacteriuria with ESBL E. Coli -No evidence of UTI on UA  Seizures -EEG negative for seizures. -No evidence of obvious seizures -Patient apparently does not take Keppra at home hence it was discontinued.  Hypothyroidism -Continue Synthroid  Hyponatremia -Monitor.  IV fluid plan as above.   Leukopenia -Improved.  No labs today.  Thrombocytopenia -No signs of bleeding.  Monitor  Anemia of chronic disease -Questionable cause.  Monitor.  No signs of bleeding  Generalized deconditioning -PT recommends SNF patient.  Social worker consult.  Palliative care following -Recommend hospice/comfort measures if condition worsens.  DVT prophylaxis: SCDs. Code Status: DNR Family Communication: None at bedside Disposition Plan: Status is: Inpatient  Remains inpatient appropriate because:Inpatient level of care appropriate due to severity of illness   Dispo: The patient is from: Home              Anticipated d/c is to: SNF  Anticipated d/c date is: 1 day              Patient currently is medically stable to d/c.  Consultants: palliative care  Procedures: None  Antimicrobials:  Anti-infectives (From admission, onward)   Start     Dose/Rate Route  Frequency Ordered Stop   08/18/20 1800  amoxicillin-clavulanate (AUGMENTIN) 875-125 MG per tablet 1 tablet        1 tablet Oral Every 12 hours 08/18/20 1049     08/17/20 2200  Ampicillin-Sulbactam (UNASYN) 3 g in sodium chloride 0.9 % 100 mL IVPB  Status:  Discontinued        3 g 200 mL/hr over 30 Minutes Intravenous Every 8 hours 08/17/20 0820 08/18/20 1049   08/16/20 2200  vancomycin (VANCOREADY) IVPB 750 mg/150 mL  Status:  Discontinued        750 mg 150 mL/hr over 60 Minutes Intravenous Every 24 hours 08/15/20 2315 08/17/20 0757   08/16/20 0600  ceFEPIme (MAXIPIME) 2 g in sodium chloride 0.9 % 100 mL IVPB  Status:  Discontinued        2 g 200 mL/hr over 30 Minutes Intravenous Every 12 hours 08/15/20 2315 08/17/20 0757   08/16/20 0200  metroNIDAZOLE (FLAGYL) IVPB 500 mg  Status:  Discontinued        500 mg 100 mL/hr over 60 Minutes Intravenous Every 8 hours 08/15/20 2142 08/16/20 1136   08/15/20 2145  vancomycin (VANCOCIN) IVPB 1000 mg/200 mL premix        1,000 mg 200 mL/hr over 60 Minutes Intravenous  Once 08/15/20 2142 08/16/20 0048   08/15/20 1730  ceFEPIme (MAXIPIME) 2 g in sodium chloride 0.9 % 100 mL IVPB        2 g 200 mL/hr over 30 Minutes Intravenous  Once 08/15/20 1727 08/15/20 1854   08/15/20 1730  metroNIDAZOLE (FLAGYL) IVPB 500 mg        500 mg 100 mL/hr over 60 Minutes Intravenous  Once 08/15/20 1727 08/15/20 1914       Subjective: Patient seen and examined at bedside.  Very poor historian. Hardly participates in any conversation.  No overnight fever, vomiting or agitation reported.   Objective: Vitals:   08/18/20 0600 08/18/20 1401 08/18/20 2152 08/19/20 0620  BP:  126/69 (!) 144/92 (!) 145/68  Pulse:  (!) 58 61 60  Resp: 18 (!) 22 20   Temp:  (!) 97.2 F (36.2 C) (!) 97.5 F (36.4 C)   TempSrc:  Oral Oral   SpO2:  97% 93% 93%  Weight:      Height:        Intake/Output Summary (Last 24 hours) at 08/19/2020 0820 Last data filed at 08/18/2020 1808 Gross  per 24 hour  Intake 720 ml  Output 400 ml  Net 320 ml   Filed Weights   08/15/20 1706 08/16/20 0424 08/17/20 0500  Weight: 52.7 kg 52.7 kg 54.5 kg    Examination:  General exam: No acute distress.  chronically ill looking elderly female lying in bed.  Awake, hardly participates in any conversation.  Very poor historian. Respiratory system: Bilateral decreased breath sounds at bases with scattered crackles, no wheezing  cardiovascular system: Intermittently bradycardic, S1-S2 heard Gastrointestinal system: Abdomen is nondistended, soft and nontender.  Bowel sounds are heard  extremities: Mild lower extremity edema present.  No cyanosis    Data Reviewed: I have personally reviewed following labs and imaging studies  CBC: Recent Labs  Lab 08/15/20 1720 08/15/20 1736 08/16/20  7628 08/17/20 0826  WBC 5.5  --  3.0* 4.1  NEUTROABS 4.6  --  2.2 3.1  HGB 11.7* 11.9* 10.9* 11.3*  HCT 36.4 35.0* 33.0* 35.8*  MCV 107.7*  --  105.8* 110.5*  PLT 166  --  133* 142*   Basic Metabolic Panel: Recent Labs  Lab 08/15/20 1720 08/15/20 1736 08/16/20 0526 08/17/20 0826  NA 133* 133* 133* 131*  K 4.3 4.1 4.1 5.4*  CL 101 99 102 101  CO2 23  --  23 20*  GLUCOSE 125* 122* 105* 93  BUN 13 12 10 12   CREATININE 0.64 0.70 0.55 0.72  CALCIUM 8.8*  --  8.7* 8.6*   GFR: Estimated Creatinine Clearance: 41 mL/min (by C-G formula based on SCr of 0.72 mg/dL). Liver Function Tests: Recent Labs  Lab 08/15/20 1720 08/16/20 0526 08/17/20 0826  AST 14* 14* 14*  ALT 10 7 9   ALKPHOS 123 104 106  BILITOT 1.2 1.2 1.0  PROT 7.1 5.9* 6.4*  ALBUMIN 3.7 3.2* 3.3*   No results for input(s): LIPASE, AMYLASE in the last 168 hours. Recent Labs  Lab 08/17/20 0826  AMMONIA 15   Coagulation Profile: Recent Labs  Lab 08/15/20 2310  INR 1.1   Cardiac Enzymes: No results for input(s): CKTOTAL, CKMB, CKMBINDEX, TROPONINI in the last 168 hours. BNP (last 3 results) No results for input(s): PROBNP  in the last 8760 hours. HbA1C: No results for input(s): HGBA1C in the last 72 hours. CBG: Recent Labs  Lab 08/15/20 1708 08/15/20 1736 08/15/20 1740 08/15/20 1844 08/16/20 0004  GLUCAP 34* <10*  <10* 98 99 135*   Lipid Profile: No results for input(s): CHOL, HDL, LDLCALC, TRIG, CHOLHDL, LDLDIRECT in the last 72 hours. Thyroid Function Tests: Recent Labs    08/17/20 0826  TSH 5.277*   Anemia Panel: Recent Labs    08/17/20 0826  VITAMINB12 207  FOLATE 7.1   Sepsis Labs: Recent Labs  Lab 08/15/20 1920 08/15/20 2310 08/16/20 0053 08/16/20 0526 08/17/20 0826  PROCALCITON  --  <0.10  --  <0.10 0.13  LATICACIDVEN 3.9* 2.1* 2.4* 1.0  --     Recent Results (from the past 240 hour(s))  Culture, blood (routine x 2)     Status: None (Preliminary result)   Collection Time: 08/15/20  5:24 PM   Specimen: BLOOD RIGHT FOREARM  Result Value Ref Range Status   Specimen Description   Final    BLOOD RIGHT FOREARM Performed at [facility], [address], [place], [place]    Special Requests   Final    BOTTLES DRAWN AEROBIC AND ANAEROBIC Blood Culture adequate volume Performed at [facility], [address] [place], [place]    Culture   Final    NO GROWTH 4 DAYS Performed at [facility], [address], [place], [address] [place]    Report Status PENDING  Incomplete  Culture, blood (routine x 2)     Status: None (Preliminary result)   Collection Time: 08/15/20  5:25 PM   Specimen: Site Not Specified; Blood  Result Value Ref Range Status   Specimen Description   Final    SITE NOT SPECIFIED Performed at [facility], [address], [place], [place]    Special Requests   Final    BOTTLES DRAWN AEROBIC AND ANAEROBIC Blood Culture adequate volume Performed at [facility], [address], [place], [place]  Culture   Final    NO GROWTH 4  DAYS Performed at [facility], [address] [place], [place] [address]    Report Status PENDING  Incomplete  Respiratory Panel by RT PCR (Flu A&B, Covid) - Nasopharyngeal Swab     Status: None   Collection Time: 08/15/20  5:44 PM   Specimen: Nasopharyngeal Swab  Result Value Ref Range Status   SARS Coronavirus 2 by RT PCR NEGATIVE NEGATIVE Final    Comment: (NOTE) SARS-CoV-2 target nucleic acids are NOT DETECTED.  The SARS-CoV-2 RNA is generally detectable in upper respiratoy specimens during the acute phase of infection. The lowest concentration of SARS-CoV-2 viral copies this assay can detect is 131 copies/mL. A negative result does not preclude SARS-Cov-2 infection and should not be used as the sole basis for treatment or other patient management decisions. A negative result may occur with  improper specimen collection/handling, submission of specimen other than nasopharyngeal swab, presence of viral mutation(s) within the areas targeted by this assay, and inadequate number of viral copies (<131 copies/mL). A negative result must be combined with clinical observations, patient history, and epidemiological information. The expected result is Negative.  Fact Sheet for Patients:  https://www.moore.com/  Fact Sheet for Healthcare Providers:  https://www.young.biz/  This test is no t yet approved or cleared by the [place] FDA and  has been authorized for detection and/or diagnosis of SARS-CoV-2 by FDA under an Emergency Use Authorization (EUA). This EUA will remain  in effect (meaning this test can be used) for the duration of the COVID-19 declaration under Section 564(b)(1) of the Act, 21 U.S.C. section 360bbb-3(b)(1), unless the authorization is terminated or revoked sooner.     Influenza A by PCR NEGATIVE NEGATIVE Final   Influenza B by PCR NEGATIVE NEGATIVE Final    Comment: (NOTE) The Xpert Xpress SARS-CoV-2/FLU/RSV  assay is intended as an aid in  the diagnosis of influenza from Nasopharyngeal swab specimens and  should not be used as a sole basis for treatment. Nasal washings and  aspirates are unacceptable for Xpert Xpress SARS-CoV-2/FLU/RSV  testing.  Fact Sheet for Patients: https://www.moore.com/  Fact Sheet for Healthcare Providers: https://www.young.biz/  This test is not yet approved or cleared by the [place] FDA and  has been authorized for detection and/or diagnosis of SARS-CoV-2 by  FDA under an Emergency Use Authorization (EUA). This EUA will remain  in effect (meaning this test can be used) for the duration of the  Covid-19 declaration under Section 564(b)(1) of the Act, 21  U.S.C. section 360bbb-3(b)(1), unless the authorization is  terminated or revoked. Performed at [facility], [address], [place], [place] [address]   Urine culture     Status: Abnormal   Collection Time: 08/15/20  6:15 PM   Specimen: Urine, Random  Result Value Ref Range Status   Specimen Description   Final    URINE, RANDOM Performed at [facility], [address] [place], [place] [address]    Special Requests   Final    NONE Performed at [facility], [address], [place], [place] [address]    Culture (A)  Final    >=100,000 COLONIES/mL ESCHERICHIA COLI Confirmed Extended Spectrum Beta-Lactamase Producer (ESBL).  In bloodstream infections from ESBL organisms, carbapenems are preferred over piperacillin/tazobactam. They are shown to have a lower risk of mortality.    Report Status 08/17/2020 FINAL  Final   Organism ID, Bacteria ESCHERICHIA COLI (A)  Final      Susceptibility   Escherichia coli - MIC*    AMPICILLIN >=32 RESISTANT Resistant     CEFAZOLIN >=64 RESISTANT Resistant     CEFEPIME 16 RESISTANT Resistant     CEFTRIAXONE 32 RESISTANT Resistant     CIPROFLOXACIN >=4 RESISTANT  Resistant     GENTAMICIN <=1 SENSITIVE Sensitive     IMIPENEM <=0.25 SENSITIVE Sensitive     NITROFURANTOIN <=16 SENSITIVE Sensitive     TRIMETH/SULFA >=320 RESISTANT Resistant     AMPICILLIN/SULBACTAM >=32 RESISTANT Resistant     PIP/TAZO 8 SENSITIVE Sensitive     * >=100,000 COLONIES/mL ESCHERICHIA COLI         Radiology Studies: No results found.      Scheduled Meds: . amoxicillin-clavulanate  1 tablet Oral Q12H  . cyanocobalamin  1,000 mcg Intramuscular Daily  . hydrOXYzine  25 mg Intramuscular Once  . lactose free nutrition  237 mL Oral BID BM  . levothyroxine  100 mcg Oral Q0600  . multivitamin with minerals  1 tablet Oral Daily  . pantoprazole  40 mg Oral BID  . sertraline  25 mg Oral Daily  . sodium chloride flush  3 mL Intravenous Q12H   Continuous Infusions:         Glade LloydKshitiz Fumi Guadron, MD [facility] 08/19/2020, 8:20 AM

## 2020-08-19 NOTE — Progress Notes (Signed)
Physical Therapy Treatment Patient Details Name: Crystal Espinoza MRN: [id number] DOB: 1929-05-08 Today's Date: 08/19/2020    History of Present Illness 86 y.o. female with medical history significant for paroxysmal atrial fibrillation on Xarelto, seizure disorder, crest syndrome, HTN, pacemaker, presented to the emergency department with lethargy on 08/15/2020.  Patient lives alone with family checking on her frequently, saw her in her usual state a day prior to presentation, but then found her to be lethargic and confused on the day of presentation.  At her baseline, the patient reportedly ambulates with a walker and is fully oriented    PT Comments    Pt assisted to bathroom and required min assist for standing and ambulating due to weakness and instability.  Pt attempted to perform pericare however required assist.  Pt able to stand at sink and wash hands with forearms resting on sink.  Pt reports LEs hurting upon ambulating back to bed.  Pt assisting as much as able and follows commands.  Pt is a HIGH fall risk.  Recommend SNF upon d/c.   Follow Up Recommendations  SNF;Supervision/Assistance - 24 hour     Equipment Recommendations  None recommended by PT    Recommendations for Other Services       Precautions / Restrictions Precautions Precautions: Fall    Mobility  Bed Mobility Overal bed mobility: Needs Assistance Bed Mobility: Supine to Sit;Sit to Supine     Supine to sit: Mod assist Sit to supine: Mod assist   General bed mobility comments: assist for upper body to rise for sitting and LEs assisted back onto bed to return to supine  Transfers Overall transfer level: Needs assistance Equipment used: Rolling walker (2 wheeled) Transfers: Sit to/from Stand Sit to Stand: Min assist         General transfer comment: verbal cues for UE positioning, assist to rise and steady  Ambulation/Gait Ambulation/Gait assistance: Min assist Gait Distance (Feet): 10 Feet  (x2) Assistive device: Rolling walker (2 wheeled) Gait Pattern/deviations: Step-through pattern;Decreased stride length;Narrow base of support     General Gait Details: assist for stabilizing, cues for using RW for more support, pt ambulated to/from bathroom   Stairs             Wheelchair Mobility    Modified Rankin (Stroke Patients Only)       Balance Overall balance assessment: Needs assistance;History of Falls         Standing balance support: Bilateral upper extremity supported Standing balance-Leahy Scale: Poor Standing balance comment: reliant on UE support                            Cognition Arousal/Alertness: Awake/alert Behavior During Therapy: WFL for tasks assessed/performed Overall Cognitive Status: Within Functional Limits for tasks assessed                                        Exercises      General Comments        Pertinent Vitals/Pain Pain Assessment: No/denies pain    Home Living                      Prior Function            PT Goals (current goals can now be found in the care plan section) Acute Rehab PT Goals PT Goal  Formulation: With patient Time For Goal Achievement: 08/26/20 Potential to Achieve Goals: Good Progress towards PT goals: Progressing toward goals    Frequency    Min 2X/week      PT Plan Current plan remains appropriate    Co-evaluation              AM-PAC PT "6 Clicks" Mobility   Outcome Measure  Help needed turning from your back to your side while in a flat bed without using bedrails?: A Little Help needed moving from lying on your back to sitting on the side of a flat bed without using bedrails?: A Little Help needed moving to and from a bed to a chair (including a wheelchair)?: A Little Help needed standing up from a chair using your arms (e.g., wheelchair or bedside chair)?: A Little Help needed to walk in hospital room?: A Little Help needed  climbing 3-5 steps with a railing? : Total 6 Click Score: 16    End of Session Equipment Utilized During Treatment: Gait belt Activity Tolerance: Patient tolerated treatment well Patient left: in bed;with call bell/phone within reach;with nursing/sitter in room Nurse Communication: Mobility status PT Visit Diagnosis: Other abnormalities of gait and mobility (R26.89);History of falling (Z91.81)     Time: 1540-1605 PT Time Calculation (min) (ACUTE ONLY): 25 min  Charges:  $Gait Training: 8-22 mins $Therapeutic Activity: 8-22 mins                    Thomasene Mohair PT, DPT Acute Rehabilitation Services Pager: [phone] Office: [phone]  Maida Sale E 08/19/2020, 4:45 PM

## 2020-08-19 NOTE — TOC Progression Note (Signed)
Transition of Care [facility]) - Progression Note    Patient Details  Name: Crystal Espinoza MRN: [id number] Date of Birth: 02-12-1929  Transition of Care [facility]) CM/SW Contact  Amada Jupiter, LCSW Phone Number: 08/19/2020, 2:04 PM  Clinical Narrative:    Have spoken with pt's daughter-in-law and her son to present SNF bed offers.  They have accepted bed with [facility] who can admit pt on Monday.  MD aware and plan to place COVID test tomorrow.  Have submitted for insurance auth (ref # [id number]).  Planning dc Monday.   Expected Discharge Plan: Skilled Nursing Facility Barriers to Discharge: SNF Pending bed offer, Insurance Authorization  Expected Discharge Plan and Services Expected Discharge Plan: Skilled Nursing Facility   Discharge Planning Services: CM Consult Post Acute Care Choice: Skilled Nursing Facility Living arrangements for the past 2 months: Single Family Home                                       Social Determinants of Health (SDOH) Interventions    Readmission Risk Interventions No flowsheet data found.

## 2020-08-20 LAB — CULTURE, BLOOD (ROUTINE X 2)
Culture: NO GROWTH
Culture: NO GROWTH
Special Requests: ADEQUATE
Special Requests: ADEQUATE

## 2020-08-20 LAB — SARS CORONAVIRUS 2 BY RT PCR (HOSPITAL ORDER, PERFORMED IN ~~LOC~~ HOSPITAL LAB): SARS Coronavirus 2: NEGATIVE

## 2020-08-20 NOTE — Progress Notes (Signed)
Patient ID: KEYAH BLIZARD, female   DOB: 1928/11/13, 86 y.o.   MRN: [id number]  PROGRESS NOTE    CHARLEI RAMSARAN  ZJI:[id number] DOB: [birth year] DOA: 08/15/2020 PCP: Merri Brunette, MD   Brief Narrative:  85 y.o. female with medical history significant for paroxysmal atrial fibrillation on Xarelto, seizure disorder, crest syndrome, and debility, presented to the emergency department with lethargy on 08/15/2020.  Patient lives alone with family checking on her frequently, saw her in her usual state a day prior to presentation, but then found her to be lethargic and confused on the day of presentation.  At her baseline, the patient reportedly ambulates with a walker and is fully oriented.  She had received COVID-19 vaccine a day prior to presentation.  In the ED, she was febrile to 38.5 C, tachypneic with stable blood pressure.  CBG was 34.  Lactic acid was elevated.  Chest x-ray was notable for stable cardiomegaly and mild vascular congestion.  CT of the abdomen and pelvis showed features suggestive of equivocal hyperemia of the prepyloric stomach which could reflect gastritis or PUD.  COVID-19 PCR is negative.  CT was also notable for groundglass opacities in the left lower lobe.  She was given IV fluids and started on broad-spectrum antibiotics.  Assessment & Plan:   Acute metabolic encephalopathy Probable undiagnosed dementia -Unclear cause.  Might be combination of hypoglycemia, fever. -Monitor mental status.  CT of the brain without contrast was negative for acute intracranial abnormality except for acute sphenoid sinusitis possibly.  EEG was negative for seizures. -Fall precautions.   -Delirium precautions.   Probable B12 deficiency -B12 on the lower side of normal at 207.  Parenteral supplementation has been started during the hospitalization.  Will need oral supplementation on discharge.  Hypoglycemia -Unclear cause.  Probably from poor oral intake.  A1c 5.4.  CBG was 34 on arrival,  treated with juice and IV dextrose.  Blood sugars improving. -DC'd IV fluids. Encourage oral intake.  Severe sepsis: Present on admission Possible pneumonia -Patient presented with confusion, fever, tachypneic and CT of the abdomen showing groundglass opacities in the left lower lobe. -IV fluids as above.  Blood cultures negative so far.  Unasyn has been switched to oral Augmentin.  Complete 7-day course of therapy.  COVID-19 test was negative.  Possible acute sphenoid sinusitis -Antibiotics line as above.  Abdominal pain -CT of the abdomen showed possible gastritis or PUD.  Continue PPI  Paroxysmal A. Fib -Patient apparently not taking Xarelto at home.  DC'd Xarelto.   Asymptomatic bacteriuria with ESBL E. Coli -No evidence of UTI on UA  Seizures -EEG negative for seizures. -No evidence of obvious seizures -Patient apparently does not take Keppra at home hence it was discontinued.  Hypothyroidism -Continue Synthroid  Hyponatremia -Monitor.  IV fluid plan as above.   Leukopenia -Improved.  No labs today.  Thrombocytopenia -No signs of bleeding.  Monitor  Anemia of chronic disease -Questionable cause.  Monitor.  No signs of bleeding  Generalized deconditioning -PT recommends SNF patient.  Social worker consult.  Palliative care following -Recommend hospice/comfort measures if condition worsens.  DVT prophylaxis: SCDs. Code Status: DNR Family Communication: None at bedside Disposition Plan: Status is: Inpatient  Remains inpatient appropriate because:Inpatient level of care appropriate due to severity of illness   Dispo: The patient is from: Home              Anticipated d/c is to: SNF  Anticipated d/c date is: 1 day              Patient currently is medically stable to d/c.  Consultants: palliative care  Procedures: None  Antimicrobials:  Anti-infectives (From admission, onward)   Start     Dose/Rate Route Frequency Ordered Stop   08/18/20  1800  amoxicillin-clavulanate (AUGMENTIN) 875-125 MG per tablet 1 tablet        1 tablet Oral Every 12 hours 08/18/20 1049     08/17/20 2200  Ampicillin-Sulbactam (UNASYN) 3 g in sodium chloride 0.9 % 100 mL IVPB  Status:  Discontinued        3 g 200 mL/hr over 30 Minutes Intravenous Every 8 hours 08/17/20 0820 08/18/20 1049   08/16/20 2200  vancomycin (VANCOREADY) IVPB 750 mg/150 mL  Status:  Discontinued        750 mg 150 mL/hr over 60 Minutes Intravenous Every 24 hours 08/15/20 2315 08/17/20 0757   08/16/20 0600  ceFEPIme (MAXIPIME) 2 g in sodium chloride 0.9 % 100 mL IVPB  Status:  Discontinued        2 g 200 mL/hr over 30 Minutes Intravenous Every 12 hours 08/15/20 2315 08/17/20 0757   08/16/20 0200  metroNIDAZOLE (FLAGYL) IVPB 500 mg  Status:  Discontinued        500 mg 100 mL/hr over 60 Minutes Intravenous Every 8 hours 08/15/20 2142 08/16/20 1136   08/15/20 2145  vancomycin (VANCOCIN) IVPB 1000 mg/200 mL premix        1,000 mg 200 mL/hr over 60 Minutes Intravenous  Once 08/15/20 2142 08/16/20 0048   08/15/20 1730  ceFEPIme (MAXIPIME) 2 g in sodium chloride 0.9 % 100 mL IVPB        2 g 200 mL/hr over 30 Minutes Intravenous  Once 08/15/20 1727 08/15/20 1854   08/15/20 1730  metroNIDAZOLE (FLAGYL) IVPB 500 mg        500 mg 100 mL/hr over 60 Minutes Intravenous  Once 08/15/20 1727 08/15/20 1914       Subjective: Patient seen and examined at bedside.  No overnight fever, agitation, shortness of breath or vomiting reported.  Extremely poor historian.  Hardly participates in meaningful conversation.    Objective: Vitals:   08/18/20 1401 08/18/20 2152 08/19/20 0620 08/19/20 1432  BP: 126/69 (!) 144/92 (!) 145/68 (!) 146/70  Pulse: (!) 58 61 60 60  Resp: (!) 22 20  (!) 22  Temp: (!) 97.2 F (36.2 C) (!) 97.5 F (36.4 C)  (!) 97.4 F (36.3 C)  TempSrc: Oral Oral  Oral  SpO2: 97% 93% 93% 95%  Weight:      Height:        Intake/Output Summary (Last 24 hours) at 08/20/2020  0753 Last data filed at 08/20/2020 0500 Gross per 24 hour  Intake 1080 ml  Output 1250 ml  Net -170 ml   Filed Weights   08/15/20 1706 08/16/20 0424 08/17/20 0500  Weight: 52.7 kg 52.7 kg 54.5 kg    Examination:  General exam: No distress.  Chronically ill looking elderly female lying in bed.    Extremely poor historian.  Hardly participates in meaningful conversation. Respiratory system: Bilateral decreased breath sounds at bases with some crackles cardiovascular system: S1-S2 heard, intermittently bradycardic Gastrointestinal system: Abdomen is nondistended, soft and nontender.  Normal bowel sounds heard  extremities: No clubbing.  Trace lower extremity edema present  Data Reviewed: I have personally reviewed following labs and imaging studies  CBC: Recent Labs  Lab 08/15/20 1720 08/15/20 1736 08/16/20 0526 08/17/20 0826  WBC 5.5  --  3.0* 4.1  NEUTROABS 4.6  --  2.2 3.1  HGB 11.7* 11.9* 10.9* 11.3*  HCT 36.4 35.0* 33.0* 35.8*  MCV 107.7*  --  105.8* 110.5*  PLT 166  --  133* 142*   Basic Metabolic Panel: Recent Labs  Lab 08/15/20 1720 08/15/20 1736 08/16/20 0526 08/17/20 0826  NA 133* 133* 133* 131*  K 4.3 4.1 4.1 5.4*  CL 101 99 102 101  CO2 23  --  23 20*  GLUCOSE 125* 122* 105* 93  BUN 13 12 10 12   CREATININE 0.64 0.70 0.55 0.72  CALCIUM 8.8*  --  8.7* 8.6*   GFR: Estimated Creatinine Clearance: 41 mL/min (by C-G formula based on SCr of 0.72 mg/dL). Liver Function Tests: Recent Labs  Lab 08/15/20 1720 08/16/20 0526 08/17/20 0826  AST 14* 14* 14*  ALT 10 7 9   ALKPHOS 123 104 106  BILITOT 1.2 1.2 1.0  PROT 7.1 5.9* 6.4*  ALBUMIN 3.7 3.2* 3.3*   No results for input(s): LIPASE, AMYLASE in the last 168 hours. Recent Labs  Lab 08/17/20 0826  AMMONIA 15   Coagulation Profile: Recent Labs  Lab 08/15/20 2310  INR 1.1   Cardiac Enzymes: No results for input(s): CKTOTAL, CKMB, CKMBINDEX, TROPONINI in the last 168 hours. BNP (last 3  results) No results for input(s): PROBNP in the last 8760 hours. HbA1C: No results for input(s): HGBA1C in the last 72 hours. CBG: Recent Labs  Lab 08/15/20 1708 08/15/20 1736 08/15/20 1740 08/15/20 1844 08/16/20 0004  GLUCAP 34* <10*  <10* 98 99 135*   Lipid Profile: No results for input(s): CHOL, HDL, LDLCALC, TRIG, CHOLHDL, LDLDIRECT in the last 72 hours. Thyroid Function Tests: No results for input(s): TSH, T4TOTAL, FREET4, T3FREE, THYROIDAB in the last 72 hours. Anemia Panel: No results for input(s): VITAMINB12, FOLATE, FERRITIN, TIBC, IRON, RETICCTPCT in the last 72 hours. Sepsis Labs: Recent Labs  Lab 08/15/20 1920 08/15/20 2310 08/16/20 0053 08/16/20 0526 08/17/20 0826  PROCALCITON  --  <0.10  --  <0.10 0.13  LATICACIDVEN 3.9* 2.1* 2.4* 1.0  --     Recent Results (from the past 240 hour(s))  Culture, blood (routine x 2)     Status: None (Preliminary result)   Collection Time: 08/15/20  5:24 PM   Specimen: BLOOD RIGHT FOREARM  Result Value Ref Range Status   Specimen Description   Final    BLOOD RIGHT FOREARM Performed at [facility], [address] [place], [place]    Special Requests   Final    BOTTLES DRAWN AEROBIC AND ANAEROBIC Blood Culture adequate volume Performed at [facility], [address], [place], [place]    Culture   Final    NO GROWTH 4 DAYS Performed at [facility], [address], [place], [address] [place]    Report Status PENDING  Incomplete  Culture, blood (routine x 2)     Status: None (Preliminary result)   Collection Time: 08/15/20  5:25 PM   Specimen: Site Not Specified; Blood  Result Value Ref Range Status   Specimen Description   Final    SITE NOT SPECIFIED Performed at [facility], [address] [place], [place]    Special Requests   Final    BOTTLES DRAWN AEROBIC AND ANAEROBIC Blood Culture adequate volume Performed at  [facility], [address] [place], [place] [address]    Culture   Final    NO GROWTH 4 DAYS Performed at [facility], [address] [place], [place] [address]    Report Status PENDING  Incomplete  Respiratory Panel by RT PCR (Flu A&B, Covid) - Nasopharyngeal Swab     Status: None   Collection Time: 08/15/20  5:44 PM   Specimen: Nasopharyngeal Swab  Result Value Ref Range Status   SARS Coronavirus 2 by RT PCR NEGATIVE NEGATIVE Final    Comment: (NOTE) SARS-CoV-2 target nucleic acids are NOT DETECTED.  The SARS-CoV-2 RNA is generally detectable in upper respiratoy specimens during the acute phase of infection. The lowest concentration of SARS-CoV-2 viral copies this assay can detect is 131 copies/mL. A negative result does not preclude SARS-Cov-2 infection and should not be used as the sole basis for treatment or other patient management decisions. A negative result may occur with  improper specimen collection/handling, submission of specimen other than nasopharyngeal swab, presence of viral mutation(s) within the areas targeted by this assay, and inadequate number of viral copies (<131 copies/mL). A negative result must be combined with clinical observations, patient history, and epidemiological information. The expected result is Negative.  Fact Sheet for Patients:  https://www.moore.com/  Fact Sheet for Healthcare Providers:  https://www.young.biz/  This test is no t yet approved or cleared by the [place] FDA and  has been authorized for detection and/or diagnosis of SARS-CoV-2 by FDA under an Emergency Use Authorization (EUA). This EUA will remain  in effect (meaning this test can be used) for the duration of the COVID-19 declaration under Section 564(b)(1) of the Act, 21 U.S.C. section 360bbb-3(b)(1), unless the authorization is terminated or revoked sooner.     Influenza A by PCR NEGATIVE  NEGATIVE Final   Influenza B by PCR NEGATIVE NEGATIVE Final    Comment: (NOTE) The Xpert Xpress SARS-CoV-2/FLU/RSV assay is intended as an aid in  the diagnosis of influenza from Nasopharyngeal swab specimens and  should not be used as a sole basis for treatment. Nasal washings and  aspirates are unacceptable for Xpert Xpress SARS-CoV-2/FLU/RSV  testing.  Fact Sheet for Patients: https://www.moore.com/  Fact Sheet for Healthcare Providers: https://www.young.biz/  This test is not yet approved or cleared by the [place] FDA and  has been authorized for detection and/or diagnosis of SARS-CoV-2 by  FDA under an Emergency Use Authorization (EUA). This EUA will remain  in effect (meaning this test can be used) for the duration of the  Covid-19 declaration under Section 564(b)(1) of the Act, 21  U.S.C. section 360bbb-3(b)(1), unless the authorization is  terminated or revoked. Performed at [facility], [address] [place], [place] [address]   Urine culture     Status: Abnormal   Collection Time: 08/15/20  6:15 PM   Specimen: Urine, Random  Result Value Ref Range Status   Specimen Description   Final    URINE, RANDOM Performed at [facility], [address] [place], [place] [address]    Special Requests   Final    NONE Performed at [facility], [address], [place], [place] [address]    Culture (A)  Final    >=100,000 COLONIES/mL ESCHERICHIA COLI Confirmed Extended Spectrum Beta-Lactamase Producer (ESBL).  In bloodstream infections from ESBL organisms, carbapenems are preferred over piperacillin/tazobactam. They are shown to have a lower risk of mortality.    Report Status 08/17/2020 FINAL  Final  Organism ID, Bacteria ESCHERICHIA COLI (A)  Final      Susceptibility   Escherichia coli - MIC*    AMPICILLIN >=32 RESISTANT Resistant     CEFAZOLIN >=64 RESISTANT Resistant      CEFEPIME 16 RESISTANT Resistant     CEFTRIAXONE 32 RESISTANT Resistant     CIPROFLOXACIN >=4 RESISTANT Resistant     GENTAMICIN <=1 SENSITIVE Sensitive     IMIPENEM <=0.25 SENSITIVE Sensitive     NITROFURANTOIN <=16 SENSITIVE Sensitive     TRIMETH/SULFA >=320 RESISTANT Resistant     AMPICILLIN/SULBACTAM >=32 RESISTANT Resistant     PIP/TAZO 8 SENSITIVE Sensitive     * >=100,000 COLONIES/mL ESCHERICHIA COLI         Radiology Studies: No results found.      Scheduled Meds: . amoxicillin-clavulanate  1 tablet Oral Q12H  . cyanocobalamin  1,000 mcg Intramuscular Daily  . hydrOXYzine  25 mg Intramuscular Once  . lactose free nutrition  237 mL Oral BID BM  . levothyroxine  100 mcg Oral Q0600  . multivitamin with minerals  1 tablet Oral Daily  . pantoprazole  40 mg Oral BID  . sertraline  25 mg Oral Daily  . sodium chloride flush  3 mL Intravenous Q12H   Continuous Infusions:         Glade Lloyd, MD [facility] 08/20/2020, 7:53 AM

## 2020-08-21 DIAGNOSIS — R230 Cyanosis: Secondary | ICD-10-CM | POA: Diagnosis not present

## 2020-08-21 DIAGNOSIS — G9340 Encephalopathy, unspecified: Secondary | ICD-10-CM | POA: Diagnosis not present

## 2020-08-21 DIAGNOSIS — Z66 Do not resuscitate: Secondary | ICD-10-CM | POA: Diagnosis not present

## 2020-08-21 DIAGNOSIS — M6281 Muscle weakness (generalized): Secondary | ICD-10-CM | POA: Diagnosis not present

## 2020-08-21 DIAGNOSIS — R52 Pain, unspecified: Secondary | ICD-10-CM | POA: Diagnosis not present

## 2020-08-21 DIAGNOSIS — W06XXXA Fall from bed, initial encounter: Secondary | ICD-10-CM | POA: Diagnosis not present

## 2020-08-21 DIAGNOSIS — R1312 Dysphagia, oropharyngeal phase: Secondary | ICD-10-CM | POA: Diagnosis not present

## 2020-08-21 DIAGNOSIS — S42002D Fracture of unspecified part of left clavicle, subsequent encounter for fracture with routine healing: Secondary | ICD-10-CM | POA: Diagnosis not present

## 2020-08-21 DIAGNOSIS — E162 Hypoglycemia, unspecified: Secondary | ICD-10-CM | POA: Diagnosis not present

## 2020-08-21 DIAGNOSIS — E039 Hypothyroidism, unspecified: Secondary | ICD-10-CM | POA: Diagnosis not present

## 2020-08-21 DIAGNOSIS — A419 Sepsis, unspecified organism: Secondary | ICD-10-CM | POA: Diagnosis not present

## 2020-08-21 DIAGNOSIS — R627 Adult failure to thrive: Secondary | ICD-10-CM | POA: Diagnosis not present

## 2020-08-21 DIAGNOSIS — S42032D Displaced fracture of lateral end of left clavicle, subsequent encounter for fracture with routine healing: Secondary | ICD-10-CM | POA: Diagnosis not present

## 2020-08-21 DIAGNOSIS — S42122A Displaced fracture of acromial process, left shoulder, initial encounter for closed fracture: Secondary | ICD-10-CM | POA: Diagnosis not present

## 2020-08-21 DIAGNOSIS — R410 Disorientation, unspecified: Secondary | ICD-10-CM | POA: Diagnosis not present

## 2020-08-21 DIAGNOSIS — Z950 Presence of cardiac pacemaker: Secondary | ICD-10-CM | POA: Diagnosis not present

## 2020-08-21 DIAGNOSIS — Z7401 Bed confinement status: Secondary | ICD-10-CM | POA: Diagnosis not present

## 2020-08-21 DIAGNOSIS — I6389 Other cerebral infarction: Secondary | ICD-10-CM | POA: Diagnosis not present

## 2020-08-21 DIAGNOSIS — M2550 Pain in unspecified joint: Secondary | ICD-10-CM | POA: Diagnosis not present

## 2020-08-21 DIAGNOSIS — Z743 Need for continuous supervision: Secondary | ICD-10-CM | POA: Diagnosis not present

## 2020-08-21 DIAGNOSIS — E538 Deficiency of other specified B group vitamins: Secondary | ICD-10-CM | POA: Diagnosis not present

## 2020-08-21 DIAGNOSIS — I639 Cerebral infarction, unspecified: Secondary | ICD-10-CM | POA: Diagnosis not present

## 2020-08-21 DIAGNOSIS — G9389 Other specified disorders of brain: Secondary | ICD-10-CM | POA: Diagnosis not present

## 2020-08-21 DIAGNOSIS — M25551 Pain in right hip: Secondary | ICD-10-CM | POA: Diagnosis not present

## 2020-08-21 DIAGNOSIS — I10 Essential (primary) hypertension: Secondary | ICD-10-CM | POA: Diagnosis not present

## 2020-08-21 DIAGNOSIS — R110 Nausea: Secondary | ICD-10-CM | POA: Diagnosis not present

## 2020-08-21 DIAGNOSIS — M419 Scoliosis, unspecified: Secondary | ICD-10-CM | POA: Diagnosis not present

## 2020-08-21 DIAGNOSIS — R918 Other nonspecific abnormal finding of lung field: Secondary | ICD-10-CM | POA: Diagnosis not present

## 2020-08-21 DIAGNOSIS — R531 Weakness: Secondary | ICD-10-CM | POA: Diagnosis not present

## 2020-08-21 DIAGNOSIS — I480 Paroxysmal atrial fibrillation: Secondary | ICD-10-CM | POA: Diagnosis not present

## 2020-08-21 DIAGNOSIS — R0902 Hypoxemia: Secondary | ICD-10-CM | POA: Diagnosis not present

## 2020-08-21 DIAGNOSIS — I5022 Chronic systolic (congestive) heart failure: Secondary | ICD-10-CM | POA: Diagnosis not present

## 2020-08-21 DIAGNOSIS — S4991XA Unspecified injury of right shoulder and upper arm, initial encounter: Secondary | ICD-10-CM | POA: Diagnosis present

## 2020-08-21 DIAGNOSIS — S199XXA Unspecified injury of neck, initial encounter: Secondary | ICD-10-CM | POA: Diagnosis not present

## 2020-08-21 DIAGNOSIS — S0990XA Unspecified injury of head, initial encounter: Secondary | ICD-10-CM | POA: Diagnosis not present

## 2020-08-21 DIAGNOSIS — I4891 Unspecified atrial fibrillation: Secondary | ICD-10-CM | POA: Diagnosis not present

## 2020-08-21 DIAGNOSIS — R404 Transient alteration of awareness: Secondary | ICD-10-CM | POA: Diagnosis not present

## 2020-08-21 DIAGNOSIS — M25552 Pain in left hip: Secondary | ICD-10-CM | POA: Diagnosis not present

## 2020-08-21 DIAGNOSIS — I110 Hypertensive heart disease with heart failure: Secondary | ICD-10-CM | POA: Diagnosis not present

## 2020-08-21 DIAGNOSIS — Z515 Encounter for palliative care: Secondary | ICD-10-CM | POA: Diagnosis not present

## 2020-08-21 DIAGNOSIS — G9341 Metabolic encephalopathy: Secondary | ICD-10-CM | POA: Diagnosis not present

## 2020-08-21 DIAGNOSIS — R262 Difficulty in walking, not elsewhere classified: Secondary | ICD-10-CM | POA: Diagnosis not present

## 2020-08-21 DIAGNOSIS — L89620 Pressure ulcer of left heel, unstageable: Secondary | ICD-10-CM | POA: Diagnosis not present

## 2020-08-21 DIAGNOSIS — S42032A Displaced fracture of lateral end of left clavicle, initial encounter for closed fracture: Secondary | ICD-10-CM | POA: Diagnosis not present

## 2020-08-21 DIAGNOSIS — M25512 Pain in left shoulder: Secondary | ICD-10-CM | POA: Diagnosis not present

## 2020-08-21 DIAGNOSIS — Z90710 Acquired absence of both cervix and uterus: Secondary | ICD-10-CM | POA: Diagnosis not present

## 2020-08-21 DIAGNOSIS — S42002A Fracture of unspecified part of left clavicle, initial encounter for closed fracture: Secondary | ICD-10-CM | POA: Diagnosis not present

## 2020-08-21 DIAGNOSIS — K2970 Gastritis, unspecified, without bleeding: Secondary | ICD-10-CM | POA: Diagnosis not present

## 2020-08-21 DIAGNOSIS — M7989 Other specified soft tissue disorders: Secondary | ICD-10-CM | POA: Diagnosis not present

## 2020-08-21 DIAGNOSIS — M25519 Pain in unspecified shoulder: Secondary | ICD-10-CM | POA: Diagnosis not present

## 2020-08-21 DIAGNOSIS — D518 Other vitamin B12 deficiency anemias: Secondary | ICD-10-CM | POA: Diagnosis not present

## 2020-08-21 DIAGNOSIS — W19XXXA Unspecified fall, initial encounter: Secondary | ICD-10-CM | POA: Diagnosis not present

## 2020-08-21 DIAGNOSIS — W19XXXD Unspecified fall, subsequent encounter: Secondary | ICD-10-CM | POA: Diagnosis not present

## 2020-08-21 LAB — GLUCOSE, CAPILLARY
Glucose-Capillary: 86 mg/dL (ref 70–99)
Glucose-Capillary: 88 mg/dL (ref 70–99)

## 2020-08-21 MED ORDER — VITAMIN B-12 1000 MCG PO TABS: 1000.0000 ug | ORAL_TABLET | Freq: Every day | ORAL | Status: AC

## 2020-08-21 MED ORDER — PANTOPRAZOLE SODIUM 40 MG PO TBEC: 40.0000 mg | DELAYED_RELEASE_TABLET | Freq: Every day | ORAL | 0 refills | Status: AC

## 2020-08-21 MED ORDER — SERTRALINE HCL 25 MG PO TABS: 25.0000 mg | ORAL_TABLET | Freq: Every day | ORAL | 0 refills | Status: AC

## 2020-08-21 NOTE — Discharge Summary (Signed)
Physician Discharge Summary  Crystal Espinoza:[id number] DOB: 04-08-1929 DOA: 08/15/2020  PCP: Merri Brunette, MD  Admit date: 08/15/2020 Discharge date: 08/21/2020  Admitted From: Home Disposition: SNF  Recommendations for Outpatient Follow-up:  1. Follow up with SNF provider at earliest convenience  2. Outpatient follow-up with palliative care at earliest convenience to continue goals of care discussion. If condition were to worsen, recommend hospice/total comfort measures    Home Health: No Equipment/Devices: None  Discharge Condition: Guarded to poor CODE STATUS: DNR Diet recommendation: Heart healthy Diet as per SLP recommendations: Regular;Dysphagia 3 (Mech soft);Thin liquid (order softer foods)   Liquid Administration via: Cup;Straw Medication Administration: Whole meds with puree Compensations: Slow rate;Small sips/bites Postural Changes: Seated upright at 90 degrees;Remain upright for at least 30 minutes after po intake   Brief/Interim Summary: 86 y.o.femalewith medical history significant forparoxysmal atrial fibrillation on Xarelto, seizure disorder, crest syndrome, and debility, presented to the emergency department with lethargy on 08/15/2020. Patient lives alone with family checking on her frequently, saw her in her usual state a day prior to presentation, but then found her to be lethargic and confused on the day of presentation. At her baseline, the patient reportedly ambulates with a walker and is fully oriented.  She had received COVID-19 vaccine a day prior to presentation.  In the ED, she was febrile to 38.5 C, tachypneic with stable blood pressure.  CBG was 34.  Lactic acid was elevated.  Chest x-ray was notable for stable cardiomegaly and mild vascular congestion.  CT of the abdomen and pelvis showed features suggestive of equivocal hyperemia of the prepyloric stomach which could reflect gastritis or PUD. COVID-19 PCR is negative. CT was also notable for  groundglass opacities in the left lower lobe.  She was given IV fluids and started on broad-spectrum antibiotics. During the hospitalization, she was also started on parenteral B12 supplementation. Mental status subsequently has stabilized. Her oral intake is still poor. Palliative care recommended outpatient palliative care follow-up. PT recommended SNF placement. She is currently on oral Augmentin and will not need any more Augmentin on discharge. Discharge to SNF once bed is available. If condition were to worsen, recommend hospice/total comfort measures.  Discharge Diagnoses:   Acute metabolic encephalopathy Probable undiagnosed dementia -Unclear cause.  Might be combination of hypoglycemia, fever. -Monitor mental status.  CT of the brain without contrast was negative for acute intracranial abnormality except for acute sphenoid sinusitis possibly.  EEG was negative for seizures. -Fall precautions.   -Outpatient follow-up. Might need neurology evaluation and follow-up as well. -PT recommended SNF placement. Discharge to SNF once bed is available.  Probable B12 deficiency -B12 on the lower side of normal at 207. Treated with parenteral supplementation during the hospitalization. Will need oral supplementation on discharge.  Hypoglycemia -Unclear cause.  Probably from poor oral intake.  A1c 5.4.  CBG was 34 on arrival, treated with juice and IV dextrose.  Blood sugars improving. -DC'd IV fluids. Encourage oral intake.  Severe sepsis: Present on admission Possible pneumonia -Patient presented with confusion, fever, tachypneic and CT of the abdomen showing groundglass opacities in the left lower lobe. -IV fluids as above.  Blood cultures negative so far.  Unasyn has been switched to oral Augmentin.   Today is day #7 of antibiotic therapy. No need for any more antibiotics on discharge. COVID-19 test was negative.  Possible acute sphenoid sinusitis -Antibiotics plan as above.  Abdominal  pain -CT of the abdomen showed possible gastritis or PUD.  Continue PPI  once a day on discharge.  Paroxysmal A. Fib -Patient apparently not taking Xarelto at home.  DC'd Xarelto.   Asymptomatic bacteriuria with ESBL E. Coli -No evidence of UTI on UA  Seizures -EEG negative for seizures. -No evidence of obvious seizures -Patient apparently does not take Keppra at home hence it was discontinued.  Hypothyroidism -Continue Synthroid  Hyponatremia -Outpatient follow-up.  Leukopenia -Improved.  Thrombocytopenia -No signs of bleeding.   Outpatient follow-up.  Anemia of chronic disease -Questionable cause.   Outpatient follow-up.  Generalized deconditioning -PT recommends SNF patient.   Palliative care evaluated the patient during the hospitalization: Recommend outpatient palliative care follow-up. -Recommend hospice/comfort measures if condition worsens.  Discharge Instructions    Allergies as of 08/21/2020      Reactions   Levaquin [levofloxacin] Other (See Comments)   unknown      Medication List    TAKE these medications   amLODipine 2.5 MG tablet Commonly known as: NORVASC Take 2.5 mg by mouth daily.   pantoprazole 40 MG tablet Commonly known as: PROTONIX Take 1 tablet (40 mg total) by mouth daily.   sertraline 25 MG tablet Commonly known as: ZOLOFT Take 1 tablet (25 mg total) by mouth daily. Start taking on: August 22, 2020   Synthroid 100 MCG tablet Generic drug: levothyroxine Take 100 mcg by mouth daily.   triamterene-hydrochlorothiazide 37.5-25 MG tablet Commonly known as: MAXZIDE-25 Take 0.5 tablets by mouth every morning.   vitamin B-12 1000 MCG tablet Commonly known as: CYANOCOBALAMIN Take 1 tablet (1,000 mcg total) by mouth daily.         Contact information for after-discharge care    Destination    [facility] .   Service: Skilled Nursing Contact information: [address] [place]  [phone]                 Allergies  Allergen Reactions  . Levaquin [Levofloxacin] Other (See Comments)    unknown    Consultations:  Palliative care   Procedures/Studies: EEG  Result Date: 08/16/2020 Crystal QuestYadav, Priyanka O, MD     08/16/2020  2:08 PM Patient Name: [name] MRN: [id number] Epilepsy Attending: Charlsie QuestPriyanka O Yadav Referring Physician/Provider: Dr Glade LloydKshitiz Arcelia Pals Date: 08/16/2020 Duration: 26.24 mins Patient history: 86 year old female with history of epilepsy who presented with altered mental status.  EEG to evaluate for seizures. Level of alertness: Awake AEDs during EEG study: Keppra Technical aspects: This EEG study was done with scalp electrodes positioned according to the 10-20 International system of electrode placement. Electrical activity was acquired at a sampling rate of 500Hz  and reviewed with a high frequency filter of 70Hz  and a low frequency filter of 1Hz . EEG data were recorded continuously and digitally stored. Description: The posterior dominant rhythm consists of 8 Hz activity of moderate voltage (25-35 uV) seen predominantly in posterior head regions, symmetric and reactive to eye opening and eye closing. EEG showed continuous generalized 3 to 6 Hz theta-delta slowing. Hyperventilation and photic stimulation were not performed.   ABNORMALITY -Continuous slow, generalized IMPRESSION: This study is suggestive of mild diffuse encephalopathy, nonspecific etiology.  No seizures or epileptiform discharges were seen throughout the recording. Priyanka Annabelle Harman Yadav   CT HEAD WO CONTRAST  Result Date: 08/16/2020 CLINICAL DATA:  Encephalopathy EXAM: CT HEAD WITHOUT CONTRAST TECHNIQUE: Contiguous axial images were obtained from the base of the skull through the vertex without intravenous contrast. COMPARISON:  June 05, 2020 FINDINGS: Brain: No evidence of acute infarction, hemorrhage, hydrocephalus,  extra-axial collection or mass lesion/mass effect.  Periventricular white matter hypodensities consistent with sequela of chronic microvascular ischemic disease. Global parenchymal volume loss. Vascular: Vascular calcifications. Skull: Normal. Negative for fracture or focal lesion. Sinuses/Orbits: Frothy fluid within the sphenoid sinuses. Other: None. IMPRESSION: 1. No acute intracranial findings. 2. Frothy fluid within the sphenoid sinuses which could reflect acute sinusitis in the appropriate clinical setting. Electronically Signed   By: Meda Klinefelter MD   On: 08/16/2020 15:35   CT Abdomen Pelvis W Contrast  Result Date: 08/15/2020 CLINICAL DATA:  Abdominal pain and fever.  Lethargy. EXAM: CT ABDOMEN AND PELVIS WITH CONTRAST TECHNIQUE: Multidetector CT imaging of the abdomen and pelvis was performed using the standard protocol following bolus administration of intravenous contrast. CONTRAST:  OMNIPAQUE IOHEXOL 300 MG/ML  SOLN COMPARISON:  No prior exams available. FINDINGS: Lower chest: Multi chamber cardiomegaly. Pacemaker partially included. Improving motion artifact in the lung bases obscures detailed assessment, however there is suggestion of patchy ground-glass opacities in the left lower lobe. Hepatobiliary: No focal hepatic abnormality. Gallbladder is not well visualized, however decompressed or surgically absent. Normal caliber common bile duct, slight central intrahepatic biliary ductal dilatation. Pancreas: Fatty atrophy.  No ductal dilatation or inflammation. Spleen: Normal in size without focal abnormality. Adrenals/Urinary Tract: Adrenal thickening without dominant nodule. No hydronephrosis or perinephric edema. Mild motion artifact through the upper kidneys partially obscures detailed assessment. No obvious renal calculi. Homogeneous enhancement with symmetric excretion on delayed phase imaging. No bladder wall thickening. Stomach/Bowel: Bowel evaluation is limited in the absence of enteric contrast and paucity of intra-abdominal fat.  Additionally there is motion artifact particularly in the upper abdomen. Stomach is partially distended. There is equivocal hyperemia of the pre pyloric stomach, for example series 2, image 39. No small bowel obstruction or inflammatory change. Scattered fecalization of small bowel contents. Wall thickening of the ascending colon versus nondistention. Moderate stool burden primarily in the left colon. There is sigmoid colonic redundancy. Scattered diverticulosis involving the descending and sigmoid colon without acute diverticulitis. Vascular/Lymphatic: Aortic and branch atherosclerosis. No aortic aneurysm. Iliac vessels are tortuous. No bulky abdominopelvic adenopathy. Reproductive: Status post hysterectomy. No adnexal masses. Other: No ascites or free air. Small fat containing umbilical hernia. No focal fluid collection. Musculoskeletal: Diffuse degenerative change throughout the spine and involving both hips. Bones are under mineralized. There are no acute or suspicious osseous abnormalities. IMPRESSION: 1. Equivocal hyperemia of the pre pyloric stomach, can be seen with gastritis or peptic ulcer disease. 2. Wall thickening of the ascending colon versus nondistention. 3. Colonic diverticulosis without diverticulitis. 4. Cardiomegaly. 5. Ground-glass opacities in the left lower lobe may be infectious or inflammatory, including COVID-19 pneumonia. Assessment is limited by breathing motion. Aortic Atherosclerosis (ICD10-I70.0). Electronically Signed   By: Narda Rutherford M.D.   On: 08/15/2020 19:46   DG Chest Port 1 View  Result Date: 08/15/2020 CLINICAL DATA:  Altered mental status. EXAM: PORTABLE CHEST 1 VIEW COMPARISON:  August 01, 2020. FINDINGS: Stable cardiomegaly with mild central pulmonary vascular congestion. Left-sided pacemaker is unchanged in position. No pneumothorax or pleural effusion is noted. Bony thorax is unremarkable. IMPRESSION: Stable cardiomegaly with mild central pulmonary vascular  congestion. Aortic Atherosclerosis (ICD10-I70.0). Electronically Signed   By: Lupita Raider M.D.   On: 08/15/2020 18:45   CUP PACEART REMOTE DEVICE CHECK  Result Date: 08/08/2020 Scheduled remote reviewed. Normal device function.  Next remote 91 days- JBox, RN/CVRS      Subjective: Patient seen and examined at bedside.  Poor historian.  Awake, does not participate in conversation much.  No overnight fever, vomiting or agitation reported.   Discharge Exam: Vitals:   08/20/20 2153 08/21/20 0500  BP: 140/76 130/80  Pulse: 60 62  Resp: 20 18  Temp: (!) 97.2 F (36.2 C) 97.8 F (36.6 C)  SpO2: 94%    General exam: No acute distress.  Chronically ill looking elderly female lying in bed.    Awake, does not participate in conversation much.  Poor historian  respiratory system: Decreased breath sounds at bases with scattered crackles cardiovascular system: Mild intermittent bradycardia present, S1-S2 heard Gastrointestinal system: Abdomen is nondistended, soft and nontender.  Bowel sounds heard  extremities: Mild lower extremity edema present.  Fingers look slightly cyanotic   The results of significant diagnostics from this hospitalization (including imaging, microbiology, ancillary and laboratory) are listed below for reference.     Microbiology: Recent Results (from the past 240 hour(s))  Culture, blood (routine x 2)     Status: None   Collection Time: 08/15/20  5:24 PM   Specimen: BLOOD RIGHT FOREARM  Result Value Ref Range Status   Specimen Description   Final    BLOOD RIGHT FOREARM Performed at [facility], [address], [place], [place] [address]    Special Requests   Final    BOTTLES DRAWN AEROBIC AND ANAEROBIC Blood Culture adequate volume Performed at [facility], [address] [place], [place] [address]    Culture   Final    NO GROWTH 5 DAYS Performed at [facility], [address], [place], [place] [address]     Report Status 08/20/2020 FINAL  Final  Culture, blood (routine x 2)     Status: None   Collection Time: 08/15/20  5:25 PM   Specimen: Site Not Specified; Blood  Result Value Ref Range Status   Specimen Description   Final    SITE NOT SPECIFIED Performed at [facility], [address] [place], [place] [address]    Special Requests   Final    BOTTLES DRAWN AEROBIC AND ANAEROBIC Blood Culture adequate volume Performed at [facility], [address], [place], [place] [address]    Culture   Final    NO GROWTH 5 DAYS Performed at [facility], [address] [place], [place] [address]    Report Status 08/20/2020 FINAL  Final  Respiratory Panel by RT PCR (Flu A&B, Covid) - Nasopharyngeal Swab     Status: None   Collection Time: 08/15/20  5:44 PM   Specimen: Nasopharyngeal Swab  Result Value Ref Range Status   SARS Coronavirus 2 by RT PCR NEGATIVE NEGATIVE Final    Comment: (NOTE) SARS-CoV-2 target nucleic acids are NOT DETECTED.  The SARS-CoV-2 RNA is generally detectable in upper respiratoy specimens during the acute phase of infection. The lowest concentration of SARS-CoV-2 viral copies this assay can detect is 131 copies/mL. A negative result does not preclude SARS-Cov-2 infection and should not be used as the sole basis for treatment or other patient management decisions. A negative result may occur with  improper specimen collection/handling, submission of specimen other than nasopharyngeal swab, presence of viral mutation(s) within the areas targeted by this assay, and inadequate number of viral copies (<131 copies/mL). A negative result must be combined with clinical observations, patient history, and epidemiological information. The expected result is Negative.  Fact Sheet for Patients:  https://www.moore.com/  Fact Sheet for Healthcare Providers:  https://www.young.biz/  This test  is no t yet approved or cleared by the [place] and  has been authorized for detection and/or diagnosis of SARS-CoV-2 by FDA under an Emergency Use Authorization (EUA). This EUA will remain  in effect (meaning this test can be used) for the duration of the COVID-19 declaration under Section 564(b)(1) of the Act, 21 U.S.C. section 360bbb-3(b)(1), unless the authorization is terminated or revoked sooner.     Influenza A by PCR NEGATIVE NEGATIVE Final   Influenza B by PCR NEGATIVE NEGATIVE Final    Comment: (NOTE) The Xpert Xpress SARS-CoV-2/FLU/RSV assay is intended as an aid in  the diagnosis of influenza from Nasopharyngeal swab specimens and  should not be used as a sole basis for treatment. Nasal washings and  aspirates are unacceptable for Xpert Xpress SARS-CoV-2/FLU/RSV  testing.  Fact Sheet for Patients: https://www.moore.com/  Fact Sheet for Healthcare Providers: https://www.young.biz/  This test is not yet approved or cleared by the [place] FDA and  has been authorized for detection and/or diagnosis of SARS-CoV-2 by  FDA under an Emergency Use Authorization (EUA). This EUA will remain  in effect (meaning this test can be used) for the duration of the  Covid-19 declaration under Section 564(b)(1) of the Act, 21  U.S.C. section 360bbb-3(b)(1), unless the authorization is  terminated or revoked. Performed at [facility], [address], [place], [place] [address]   Urine culture     Status: Abnormal   Collection Time: 08/15/20  6:15 PM   Specimen: Urine, Random  Result Value Ref Range Status   Specimen Description   Final    URINE, RANDOM Performed at [facility], [address], [place], [place] [address]    Special Requests   Final    NONE Performed at [facility], [address] [place], [place] [address]    Culture (A)  Final    >=100,000 COLONIES/mL  ESCHERICHIA COLI Confirmed Extended Spectrum Beta-Lactamase Producer (ESBL).  In bloodstream infections from ESBL organisms, carbapenems are preferred over piperacillin/tazobactam. They are shown to have a lower risk of mortality.    Report Status 08/17/2020 FINAL  Final   Organism ID, Bacteria ESCHERICHIA COLI (A)  Final      Susceptibility   Escherichia coli - MIC*    AMPICILLIN >=32 RESISTANT Resistant     CEFAZOLIN >=64 RESISTANT Resistant     CEFEPIME 16 RESISTANT Resistant     CEFTRIAXONE 32 RESISTANT Resistant     CIPROFLOXACIN >=4 RESISTANT Resistant     GENTAMICIN <=1 SENSITIVE Sensitive     IMIPENEM <=0.25 SENSITIVE Sensitive     NITROFURANTOIN <=16 SENSITIVE Sensitive     TRIMETH/SULFA >=320 RESISTANT Resistant     AMPICILLIN/SULBACTAM >=32 RESISTANT Resistant     PIP/TAZO 8 SENSITIVE Sensitive     * >=100,000 COLONIES/mL ESCHERICHIA COLI  SARS Coronavirus 2 by RT PCR (hospital order, performed in [facility] hospital lab) Nasopharyngeal Nasopharyngeal Swab     Status: None   Collection Time: 08/20/20 10:32 AM   Specimen: Nasopharyngeal Swab  Result Value Ref Range Status   SARS Coronavirus 2 NEGATIVE NEGATIVE Final    Comment: (NOTE) SARS-CoV-2 target nucleic acids are NOT DETECTED.  The SARS-CoV-2 RNA is generally detectable in upper and lower respiratory specimens during the acute phase of infection. The lowest concentration of SARS-CoV-2 viral copies this assay can detect is 250 copies / mL. A negative result does not preclude SARS-CoV-2 infection and should  not be used as the sole basis for treatment or other patient management decisions.  A negative result may occur with improper specimen collection / handling, submission of specimen other than nasopharyngeal swab, presence of viral mutation(s) within the areas targeted by this assay, and inadequate number of viral copies (<250 copies / mL). A negative result must be combined with clinical observations, patient  history, and epidemiological information.  Fact Sheet for Patients:   BoilerBrush.com.cy  Fact Sheet for Healthcare Providers: https://pope.com/  This test is not yet approved or  cleared by the [place] FDA and has been authorized for detection and/or diagnosis of SARS-CoV-2 by FDA under an Emergency Use Authorization (EUA).  This EUA will remain in effect (meaning this test can be used) for the duration of the COVID-19 declaration under Section 564(b)(1) of the Act, 21 U.S.C. section 360bbb-3(b)(1), unless the authorization is terminated or revoked sooner.  Performed at [facility], [address] [place], [place] [address]      Labs: BNP (last 3 results) No results for input(s): BNP in the last 8760 hours. Basic Metabolic Panel: Recent Labs  Lab 08/15/20 1720 08/15/20 1736 08/16/20 0526 08/17/20 0826  NA 133* 133* 133* 131*  K 4.3 4.1 4.1 5.4*  CL 101 99 102 101  CO2 23  --  23 20*  GLUCOSE 125* 122* 105* 93  BUN CREATININE 0.64 0.70 0.55 0.72  CALCIUM 8.8*  --  8.7* 8.6*   Liver Function Tests: Recent Labs  Lab 08/15/20 1720 08/16/20 0526 08/17/20 0826  AST 14* 14* 14*  ALT ALKPHOS 123 104 106  BILITOT 1.2 1.2 1.0  PROT 7.1 5.9* 6.4*  ALBUMIN 3.7 3.2* 3.3*   No results for input(s): LIPASE, AMYLASE in the last 168 hours. Recent Labs  Lab 08/17/20 0826  AMMONIA 15   CBC: Recent Labs  Lab 08/15/20 1720 08/15/20 1736 08/16/20 0526 08/17/20 0826  WBC 5.5  --  3.0* 4.1  NEUTROABS 4.6  --  2.2 3.1  HGB 11.7* 11.9* 10.9* 11.3*  HCT 36.4 35.0* 33.0* 35.8*  MCV 107.7*  --  105.8* 110.5*  PLT 166  --  133* 142*   Cardiac Enzymes: No results for input(s): CKTOTAL, CKMB, CKMBINDEX, TROPONINI in the last 168 hours. BNP: Invalid input(s): POCBNP CBG: Recent Labs  Lab 08/15/20 1736 08/15/20 1740 08/15/20 1844 08/16/20 0004 08/21/20 0805  GLUCAP <10*   <10* 98 99 135* 86   D-Dimer No results for input(s): DDIMER in the last 72 hours. Hgb A1c No results for input(s): HGBA1C in the last 72 hours. Lipid Profile No results for input(s): CHOL, HDL, LDLCALC, TRIG, CHOLHDL, LDLDIRECT in the last 72 hours. Thyroid function studies No results for input(s): TSH, T4TOTAL, T3FREE, THYROIDAB in the last 72 hours.  Invalid input(s): FREET3 Anemia work up No results for input(s): VITAMINB12, FOLATE, FERRITIN, TIBC, IRON, RETICCTPCT in the last 72 hours. Urinalysis    Component Value Date/Time   COLORURINE YELLOW 08/15/2020 1815   APPEARANCEUR CLEAR 08/15/2020 1815   LABSPEC 1.010 08/15/2020 1815   PHURINE 7.0 08/15/2020 1815   GLUCOSEU NEGATIVE 08/15/2020 1815   HGBUR SMALL (A) 08/15/2020 1815   BILIRUBINUR NEGATIVE 08/15/2020 1815   KETONESUR NEGATIVE 08/15/2020 1815   PROTEINUR NEGATIVE 08/15/2020 1815   NITRITE NEGATIVE 08/15/2020 1815   LEUKOCYTESUR NEGATIVE 08/15/2020 1815   Sepsis Labs Invalid input(s): PROCALCITONIN,  WBC,  LACTICIDVEN Microbiology Recent Results (from the past 240  hour(s))  Culture, blood (routine x 2)     Status: None   Collection Time: 08/15/20  5:24 PM   Specimen: BLOOD RIGHT FOREARM  Result Value Ref Range Status   Specimen Description   Final    BLOOD RIGHT FOREARM Performed at [facility], [address], [place], [place] [address]    Special Requests   Final    BOTTLES DRAWN AEROBIC AND ANAEROBIC Blood Culture adequate volume Performed at [facility], [address] [place], [place] [address]    Culture   Final    NO GROWTH 5 DAYS Performed at [facility], [address], [place], [place] [address]    Report Status 08/20/2020 FINAL  Final  Culture, blood (routine x 2)     Status: None   Collection Time: 08/15/20  5:25 PM   Specimen: Site Not Specified; Blood  Result Value Ref Range Status   Specimen Description   Final    SITE NOT  SPECIFIED Performed at [facility], [address], [place], [place] [address]    Special Requests   Final    BOTTLES DRAWN AEROBIC AND ANAEROBIC Blood Culture adequate volume Performed at [facility], [address] [place], [place] [address]    Culture   Final    NO GROWTH 5 DAYS Performed at [facility], [address], [place], [place] [address]    Report Status 08/20/2020 FINAL  Final  Respiratory Panel by RT PCR (Flu A&B, Covid) - Nasopharyngeal Swab     Status: None   Collection Time: 08/15/20  5:44 PM   Specimen: Nasopharyngeal Swab  Result Value Ref Range Status   SARS Coronavirus 2 by RT PCR NEGATIVE NEGATIVE Final    Comment: (NOTE) SARS-CoV-2 target nucleic acids are NOT DETECTED.  The SARS-CoV-2 RNA is generally detectable in upper respiratoy specimens during the acute phase of infection. The lowest concentration of SARS-CoV-2 viral copies this assay can detect is 131 copies/mL. A negative result does not preclude SARS-Cov-2 infection and should not be used as the sole basis for treatment or other patient management decisions. A negative result may occur with  improper specimen collection/handling, submission of specimen other than nasopharyngeal swab, presence of viral mutation(s) within the areas targeted by this assay, and inadequate number of viral copies (<131 copies/mL). A negative result must be combined with clinical observations, patient history, and epidemiological information. The expected result is Negative.  Fact Sheet for Patients:  https://www.moore.com/  Fact Sheet for Healthcare Providers:  https://www.young.biz/  This test is no t yet approved or cleared by the [place] FDA and  has been authorized for detection and/or diagnosis of SARS-CoV-2 by FDA under an Emergency Use Authorization (EUA). This EUA will remain  in effect (meaning this test can be used)  for the duration of the COVID-19 declaration under Section 564(b)(1) of the Act, 21 U.S.C. section 360bbb-3(b)(1), unless the authorization is terminated or revoked sooner.     Influenza A by PCR NEGATIVE NEGATIVE Final   Influenza B by PCR NEGATIVE NEGATIVE Final    Comment: (NOTE) The Xpert Xpress SARS-CoV-2/FLU/RSV assay is intended as an aid in  the diagnosis of influenza from Nasopharyngeal swab specimens and  should not be used as a sole basis for treatment. Nasal washings and  aspirates are unacceptable for Xpert Xpress SARS-CoV-2/FLU/RSV  testing.  Fact Sheet for Patients: https://www.moore.com/  Fact Sheet for Healthcare Providers: https://www.young.biz/  This  test is not yet approved or cleared by the [place] and  has been authorized for detection and/or diagnosis of SARS-CoV-2 by  FDA under an Emergency Use Authorization (EUA). This EUA will remain  in effect (meaning this test can be used) for the duration of the  Covid-19 declaration under Section 564(b)(1) of the Act, 21  U.S.C. section 360bbb-3(b)(1), unless the authorization is  terminated or revoked. Performed at [facility], [address] [place], [place] [address]   Urine culture     Status: Abnormal   Collection Time: 08/15/20  6:15 PM   Specimen: Urine, Random  Result Value Ref Range Status   Specimen Description   Final    URINE, RANDOM Performed at [facility], [address] [place], [place] [address]    Special Requests   Final    NONE Performed at [facility], [address], [place], [place] [address]    Culture (A)  Final    >=100,000 COLONIES/mL ESCHERICHIA COLI Confirmed Extended Spectrum Beta-Lactamase Producer (ESBL).  In bloodstream infections from ESBL organisms, carbapenems are preferred over piperacillin/tazobactam. They are shown to have a lower risk of mortality.    Report  Status 08/17/2020 FINAL  Final   Organism ID, Bacteria ESCHERICHIA COLI (A)  Final      Susceptibility   Escherichia coli - MIC*    AMPICILLIN >=32 RESISTANT Resistant     CEFAZOLIN >=64 RESISTANT Resistant     CEFEPIME 16 RESISTANT Resistant     CEFTRIAXONE 32 RESISTANT Resistant     CIPROFLOXACIN >=4 RESISTANT Resistant     GENTAMICIN <=1 SENSITIVE Sensitive     IMIPENEM <=0.25 SENSITIVE Sensitive     NITROFURANTOIN <=16 SENSITIVE Sensitive     TRIMETH/SULFA >=320 RESISTANT Resistant     AMPICILLIN/SULBACTAM >=32 RESISTANT Resistant     PIP/TAZO 8 SENSITIVE Sensitive     * >=100,000 COLONIES/mL ESCHERICHIA COLI  SARS Coronavirus 2 by RT PCR (hospital order, performed in [facility] hospital lab) Nasopharyngeal Nasopharyngeal Swab     Status: None   Collection Time: 08/20/20 10:32 AM   Specimen: Nasopharyngeal Swab  Result Value Ref Range Status   SARS Coronavirus 2 NEGATIVE NEGATIVE Final    Comment: (NOTE) SARS-CoV-2 target nucleic acids are NOT DETECTED.  The SARS-CoV-2 RNA is generally detectable in upper and lower respiratory specimens during the acute phase of infection. The lowest concentration of SARS-CoV-2 viral copies this assay can detect is 250 copies / mL. A negative result does not preclude SARS-CoV-2 infection and should not be used as the sole basis for treatment or other patient management decisions.  A negative result may occur with improper specimen collection / handling, submission of specimen other than nasopharyngeal swab, presence of viral mutation(s) within the areas targeted by this assay, and inadequate number of viral copies (<250 copies / mL). A negative result must be combined with clinical observations, patient history, and epidemiological information.  Fact Sheet for Patients:   BoilerBrush.com.cy  Fact Sheet for Healthcare Providers: https://pope.com/  This test is not yet approved or  cleared by  the [place] FDA and has been authorized for detection and/or diagnosis of SARS-CoV-2 by FDA under an Emergency Use Authorization (EUA).  This EUA will remain in effect (meaning this test can be used) for the duration of the COVID-19 declaration under Section 564(b)(1) of the Act, 21 U.S.C. section 360bbb-3(b)(1), unless the authorization is terminated or revoked sooner.  Performed at [facility], [address] [place], [place] [address]      Time coordinating discharge: 35 minutes  SIGNED:   Glade Lloyd, MD  [facility] 08/21/2020, 11:01 AM

## 2020-08-21 NOTE — Progress Notes (Signed)
Attempted to call report on pt, unable to reach anyone. Will attempt again.

## 2020-08-21 NOTE — Progress Notes (Signed)
Report called to Delaney Meigs, RN at receiving facility.

## 2020-08-21 NOTE — TOC Transition Note (Addendum)
Transition of Care [facility]) - CM/SW Discharge Note   Patient Details  Name: Crystal Espinoza MRN: [id number] Date of Birth: August 30, 1929  [facility] CM/SW Contact:  Lanier Clam, RN Phone Number: 08/21/2020, 12:23 PM   Clinical Narrative:Received Berkley HarveyBerkley Harvey ID- [id number], informed of change of SNF from [facility](d/t no isolation bed avail till 11/13)-auth for 3 days,NRD 11/10 A. [place]-fax clinicals [phone]-[facility] chosen rep Olegario Messier has isolation beds available-going to rm#[id number],nsg report tel#[phone]-informed dtr in [employer]. PTAR to be called once d/c summary in place. No further CM needs.      Final next level of care: Skilled Nursing Facility Barriers to Discharge: No Barriers Identified   Patient Goals and CMS Choice Patient states their goals for this hospitalization and ongoing recovery are:: go to rehab CMS Medicare.gov Compare Post Acute Care list provided to:: Patient Represenative (must comment) Choice offered to / list presented to : Adult Children  Discharge Placement PASRR number recieved: 08/18/20            Patient chooses bed at: [facility] Patient to be transferred to facility by: PTAR Name of family member notified: Natalia Leatherwood dtr in law [phone] Patient and family notified of of transfer: 08/21/20  Discharge Plan and Services   Discharge Planning Services: CM Consult Post Acute Care Choice: Skilled Nursing Facility                               Social Determinants of Health (SDOH) Interventions     Readmission Risk Interventions No flowsheet data found.

## 2020-08-21 NOTE — Progress Notes (Signed)
Pt discharged to facility at this time via transportation. Prior to dc, IV was removed. Pt belongings were packed and sent with her. Pt stable at time of dc.

## 2020-08-21 NOTE — Care Management Important Message (Signed)
Important Message  Patient Details IM Letter given to the Patient Name: Crystal Espinoza MRN: [id number] Date of Birth: 1929/06/14   Medicare Important Message Given:  Yes     [name] 08/21/2020, 12:27 PM

## 2020-08-21 NOTE — Progress Notes (Signed)
Patient ID: Crystal Espinoza, female   DOB: 05/02/1929, 86 y.o.   MRN: [id number]  PROGRESS NOTE    Crystal Espinoza  [id number] DOB: 12/04/1928 DOA: 08/15/2020 PCP: Merri BrunettePharr, Walter, MD   Brief Narrative:  86 y.o. female with medical history significant for paroxysmal atrial fibrillation on Xarelto, seizure disorder, crest syndrome, and debility, presented to the emergency department with lethargy on 08/15/2020.  Patient lives alone with family checking on her frequently, saw her in her usual state a day prior to presentation, but then found her to be lethargic and confused on the day of presentation.  At her baseline, the patient reportedly ambulates with a walker and is fully oriented.  She had received COVID-19 vaccine a day prior to presentation.  In the ED, she was febrile to 38.5 C, tachypneic with stable blood pressure.  CBG was 34.  Lactic acid was elevated.  Chest x-ray was notable for stable cardiomegaly and mild vascular congestion.  CT of the abdomen and pelvis showed features suggestive of equivocal hyperemia of the prepyloric stomach which could reflect gastritis or PUD.  COVID-19 PCR is negative.  CT was also notable for groundglass opacities in the left lower lobe.  She was given IV fluids and started on broad-spectrum antibiotics.  Assessment & Plan:   Acute metabolic encephalopathy Probable undiagnosed dementia -Unclear cause.  Might be combination of hypoglycemia, fever. -Monitor mental status.  CT of the brain without contrast was negative for acute intracranial abnormality except for acute sphenoid sinusitis possibly.  EEG was negative for seizures. -Fall precautions.   -Delirium precautions.   Probable B12 deficiency -B12 on the lower side of normal at 207.  Parenteral supplementation has been started during the hospitalization.  Will need oral supplementation on discharge.  Hypoglycemia -Unclear cause.  Probably from poor oral intake.  A1c 5.4.  CBG was 34 on arrival,  treated with juice and IV dextrose.  Blood sugars improving. -DC'd IV fluids. Encourage oral intake.  Severe sepsis: Present on admission Possible pneumonia -Patient presented with confusion, fever, tachypneic and CT of the abdomen showing groundglass opacities in the left lower lobe. -IV fluids as above.  Blood cultures negative so far.  Unasyn has been switched to oral Augmentin.  Complete 7-day course of therapy.  COVID-19 test was negative.  Possible acute sphenoid sinusitis -Antibiotics line as above.  Abdominal pain -CT of the abdomen showed possible gastritis or PUD.  Continue PPI  Paroxysmal A. Fib -Patient apparently not taking Xarelto at home.  DC'd Xarelto.   Asymptomatic bacteriuria with ESBL E. Coli -No evidence of UTI on UA  Seizures -EEG negative for seizures. -No evidence of obvious seizures -Patient apparently does not take Keppra at home hence it was discontinued.  Hypothyroidism -Continue Synthroid  Hyponatremia -Monitor.  IV fluid plan as above.   Leukopenia -Improved.  No labs today.  Thrombocytopenia -No signs of bleeding.  Monitor  Anemia of chronic disease -Questionable cause.  Monitor.  No signs of bleeding  Generalized deconditioning -PT recommends SNF patient.  Social worker following.  Palliative care following -Recommend hospice/comfort measures if condition worsens.  DVT prophylaxis: SCDs. Code Status: DNR Family Communication: None at bedside Disposition Plan: Status is: Inpatient  Remains inpatient appropriate because:Inpatient level of care appropriate due to severity of illness   Dispo: The patient is from: Home              Anticipated d/c is to: SNF  Anticipated d/c date is: 1 day              Patient currently is medically stable to d/c.  Consultants: palliative care  Procedures: None  Antimicrobials:  Anti-infectives (From admission, onward)   Start     Dose/Rate Route Frequency Ordered Stop   08/18/20  1800  amoxicillin-clavulanate (AUGMENTIN) 875-125 MG per tablet 1 tablet        1 tablet Oral Every 12 hours 08/18/20 1049     08/17/20 2200  Ampicillin-Sulbactam (UNASYN) 3 g in sodium chloride 0.9 % 100 mL IVPB  Status:  Discontinued        3 g 200 mL/hr over 30 Minutes Intravenous Every 8 hours 08/17/20 0820 08/18/20 1049   08/16/20 2200  vancomycin (VANCOREADY) IVPB 750 mg/150 mL  Status:  Discontinued        750 mg 150 mL/hr over 60 Minutes Intravenous Every 24 hours 08/15/20 2315 08/17/20 0757   08/16/20 0600  ceFEPIme (MAXIPIME) 2 g in sodium chloride 0.9 % 100 mL IVPB  Status:  Discontinued        2 g 200 mL/hr over 30 Minutes Intravenous Every 12 hours 08/15/20 2315 08/17/20 0757   08/16/20 0200  metroNIDAZOLE (FLAGYL) IVPB 500 mg  Status:  Discontinued        500 mg 100 mL/hr over 60 Minutes Intravenous Every 8 hours 08/15/20 2142 08/16/20 1136   08/15/20 2145  vancomycin (VANCOCIN) IVPB 1000 mg/200 mL premix        1,000 mg 200 mL/hr over 60 Minutes Intravenous  Once 08/15/20 2142 08/16/20 0048   08/15/20 1730  ceFEPIme (MAXIPIME) 2 g in sodium chloride 0.9 % 100 mL IVPB        2 g 200 mL/hr over 30 Minutes Intravenous  Once 08/15/20 1727 08/15/20 1854   08/15/20 1730  metroNIDAZOLE (FLAGYL) IVPB 500 mg        500 mg 100 mL/hr over 60 Minutes Intravenous  Once 08/15/20 1727 08/15/20 1914       Subjective: Patient seen and examined at bedside.  Poor historian.  Awake, does not participate in conversation much.  No overnight fever, vomiting or agitation reported.  Objective: Vitals:   08/19/20 1432 08/20/20 1440 08/20/20 2153 08/21/20 0500  BP: (!) 146/70 138/73 140/76 130/80  Pulse: 60 (!) 59 60 62  Resp: (!) 22  20 18   Temp: (!) 97.4 F (36.3 C) 97.7 F (36.5 C) (!) 97.2 F (36.2 C) 97.8 F (36.6 C)  TempSrc: Oral Oral Oral Oral  SpO2: 95%  94%   Weight:    58.1 kg  Height:        Intake/Output Summary (Last 24 hours) at 08/21/2020 1034 Last data filed at  08/21/2020 0127 Gross per 24 hour  Intake 120 ml  Output 1050 ml  Net -930 ml   Filed Weights   08/16/20 0424 08/17/20 0500 08/21/20 0500  Weight: 52.7 kg 54.5 kg 58.1 kg    Examination:  General exam: No acute distress.  Chronically ill looking elderly female lying in bed.    Awake, does not participate in conversation much.  Poor historian  respiratory system: Decreased breath sounds at bases with scattered crackles cardiovascular system: Mild intermittent bradycardia present, S1-S2 heard Gastrointestinal system: Abdomen is nondistended, soft and nontender.  Bowel sounds heard  extremities: Mild lower extremity edema present.  Fingers look slightly cyanotic  Data Reviewed: I have personally reviewed following labs and imaging studies  CBC: Recent Labs  Lab 08/15/20 1720 08/15/20 1736 08/16/20 0526 08/17/20 0826  WBC 5.5  --  3.0* 4.1  NEUTROABS 4.6  --  2.2 3.1  HGB 11.7* 11.9* 10.9* 11.3*  HCT 36.4 35.0* 33.0* 35.8*  MCV 107.7*  --  105.8* 110.5*  PLT 166  --  133* 142*   Basic Metabolic Panel: Recent Labs  Lab 08/15/20 1720 08/15/20 1736 08/16/20 0526 08/17/20 0826  NA 133* 133* 133* 131*  K 4.3 4.1 4.1 5.4*  CL 101 99 102 101  CO2 23  --  23 20*  GLUCOSE 125* 122* 105* 93  BUN 13 12 10 12   CREATININE 0.64 0.70 0.55 0.72  CALCIUM 8.8*  --  8.7* 8.6*   GFR: Estimated Creatinine Clearance: 43.7 mL/min (by C-G formula based on SCr of 0.72 mg/dL). Liver Function Tests: Recent Labs  Lab 08/15/20 1720 08/16/20 0526 08/17/20 0826  AST 14* 14* 14*  ALT 10 7 9   ALKPHOS 123 104 106  BILITOT 1.2 1.2 1.0  PROT 7.1 5.9* 6.4*  ALBUMIN 3.7 3.2* 3.3*   No results for input(s): LIPASE, AMYLASE in the last 168 hours. Recent Labs  Lab 08/17/20 0826  AMMONIA 15   Coagulation Profile: Recent Labs  Lab 08/15/20 2310  INR 1.1   Cardiac Enzymes: No results for input(s): CKTOTAL, CKMB, CKMBINDEX, TROPONINI in the last 168 hours. BNP (last 3 results) No  results for input(s): PROBNP in the last 8760 hours. HbA1C: No results for input(s): HGBA1C in the last 72 hours. CBG: Recent Labs  Lab 08/15/20 1736 08/15/20 1740 08/15/20 1844 08/16/20 0004 08/21/20 0805  GLUCAP <10*  <10* 98 99 135* 86   Lipid Profile: No results for input(s): CHOL, HDL, LDLCALC, TRIG, CHOLHDL, LDLDIRECT in the last 72 hours. Thyroid Function Tests: No results for input(s): TSH, T4TOTAL, FREET4, T3FREE, THYROIDAB in the last 72 hours. Anemia Panel: No results for input(s): VITAMINB12, FOLATE, FERRITIN, TIBC, IRON, RETICCTPCT in the last 72 hours. Sepsis Labs: Recent Labs  Lab 08/15/20 1920 08/15/20 2310 08/16/20 0053 08/16/20 0526 08/17/20 0826  PROCALCITON  --  <0.10  --  <0.10 0.13  LATICACIDVEN 3.9* 2.1* 2.4* 1.0  --     Recent Results (from the past 240 hour(s))  Culture, blood (routine x 2)     Status: None   Collection Time: 08/15/20  5:24 PM   Specimen: BLOOD RIGHT FOREARM  Result Value Ref Range Status   Specimen Description   Final    BLOOD RIGHT FOREARM Performed at [facility], [address] [place], [place]    Special Requests   Final    BOTTLES DRAWN AEROBIC AND ANAEROBIC Blood Culture adequate volume Performed at [facility], [address], [place], [place]    Culture   Final    NO GROWTH 5 DAYS Performed at [facility], [address] [place], [address] [place]    Report Status 08/20/2020 FINAL  Final  Culture, blood (routine x 2)     Status: None   Collection Time: 08/15/20  5:25 PM   Specimen: Site Not Specified; Blood  Result Value Ref Range Status   Specimen Description   Final    SITE NOT SPECIFIED Performed at [facility], [address], [place], [place]    Special Requests   Final    BOTTLES DRAWN AEROBIC AND ANAEROBIC Blood Culture adequate volume Performed at [facility], [address]  [place], [place] [address]    Culture   Final    NO GROWTH 5 DAYS Performed at [facility], [address], [place], [place] [address]    Report Status 08/20/2020 FINAL  Final  Respiratory Panel by RT PCR (Flu A&B, Covid) - Nasopharyngeal Swab     Status: None   Collection Time: 08/15/20  5:44 PM   Specimen: Nasopharyngeal Swab  Result Value Ref Range Status   SARS Coronavirus 2 by RT PCR NEGATIVE NEGATIVE Final    Comment: (NOTE) SARS-CoV-2 target nucleic acids are NOT DETECTED.  The SARS-CoV-2 RNA is generally detectable in upper respiratoy specimens during the acute phase of infection. The lowest concentration of SARS-CoV-2 viral copies this assay can detect is 131 copies/mL. A negative result does not preclude SARS-Cov-2 infection and should not be used as the sole basis for treatment or other patient management decisions. A negative result may occur with  improper specimen collection/handling, submission of specimen other than nasopharyngeal swab, presence of viral mutation(s) within the areas targeted by this assay, and inadequate number of viral copies (<131 copies/mL). A negative result must be combined with clinical observations, patient history, and epidemiological information. The expected result is Negative.  Fact Sheet for Patients:  https://www.moore.com/  Fact Sheet for Healthcare Providers:  https://www.young.biz/  This test is no t yet approved or cleared by the [place] FDA and  has been authorized for detection and/or diagnosis of SARS-CoV-2 by FDA under an Emergency Use Authorization (EUA). This EUA will remain  in effect (meaning this test can be used) for the duration of the COVID-19 declaration under Section 564(b)(1) of the Act, 21 U.S.C. section 360bbb-3(b)(1), unless the authorization is terminated or revoked sooner.     Influenza A by PCR NEGATIVE NEGATIVE Final   Influenza B by PCR NEGATIVE NEGATIVE  Final    Comment: (NOTE) The Xpert Xpress SARS-CoV-2/FLU/RSV assay is intended as an aid in  the diagnosis of influenza from Nasopharyngeal swab specimens and  should not be used as a sole basis for treatment. Nasal washings and  aspirates are unacceptable for Xpert Xpress SARS-CoV-2/FLU/RSV  testing.  Fact Sheet for Patients: https://www.moore.com/  Fact Sheet for Healthcare Providers: https://www.young.biz/  This test is not yet approved or cleared by the [place] FDA and  has been authorized for detection and/or diagnosis of SARS-CoV-2 by  FDA under an Emergency Use Authorization (EUA). This EUA will remain  in effect (meaning this test can be used) for the duration of the  Covid-19 declaration under Section 564(b)(1) of the Act, 21  U.S.C. section 360bbb-3(b)(1), unless the authorization is  terminated or revoked. Performed at [facility], [address], [place], [place] [address]   Urine culture     Status: Abnormal   Collection Time: 08/15/20  6:15 PM   Specimen: Urine, Random  Result Value Ref Range Status   Specimen Description   Final    URINE, RANDOM Performed at [facility], [address], [place], [place] [address]    Special Requests   Final    NONE Performed at [facility], [address] [place], [place] [address]    Culture (A)  Final    >=100,000 COLONIES/mL ESCHERICHIA COLI Confirmed Extended Spectrum Beta-Lactamase Producer (ESBL).  In bloodstream infections from ESBL organisms, carbapenems are preferred over piperacillin/tazobactam. They are shown to have a lower risk of mortality.    Report Status 08/17/2020 FINAL  Final   Organism  ID, Bacteria ESCHERICHIA COLI (A)  Final      Susceptibility   Escherichia coli - MIC*    AMPICILLIN >=32 RESISTANT Resistant     CEFAZOLIN >=64 RESISTANT Resistant     CEFEPIME 16 RESISTANT Resistant     CEFTRIAXONE  32 RESISTANT Resistant     CIPROFLOXACIN >=4 RESISTANT Resistant     GENTAMICIN <=1 SENSITIVE Sensitive     IMIPENEM <=0.25 SENSITIVE Sensitive     NITROFURANTOIN <=16 SENSITIVE Sensitive     TRIMETH/SULFA >=320 RESISTANT Resistant     AMPICILLIN/SULBACTAM >=32 RESISTANT Resistant     PIP/TAZO 8 SENSITIVE Sensitive     * >=100,000 COLONIES/mL ESCHERICHIA COLI  SARS Coronavirus 2 by RT PCR (hospital order, performed in [facility] hospital lab) Nasopharyngeal Nasopharyngeal Swab     Status: None   Collection Time: 08/20/20 10:32 AM   Specimen: Nasopharyngeal Swab  Result Value Ref Range Status   SARS Coronavirus 2 NEGATIVE NEGATIVE Final    Comment: (NOTE) SARS-CoV-2 target nucleic acids are NOT DETECTED.  The SARS-CoV-2 RNA is generally detectable in upper and lower respiratory specimens during the acute phase of infection. The lowest concentration of SARS-CoV-2 viral copies this assay can detect is 250 copies / mL. A negative result does not preclude SARS-CoV-2 infection and should not be used as the sole basis for treatment or other patient management decisions.  A negative result may occur with improper specimen collection / handling, submission of specimen other than nasopharyngeal swab, presence of viral mutation(s) within the areas targeted by this assay, and inadequate number of viral copies (<250 copies / mL). A negative result must be combined with clinical observations, patient history, and epidemiological information.  Fact Sheet for Patients:   BoilerBrush.com.cy  Fact Sheet for Healthcare Providers: https://pope.com/  This test is not yet approved or  cleared by the [place] FDA and has been authorized for detection and/or diagnosis of SARS-CoV-2 by FDA under an Emergency Use Authorization (EUA).  This EUA will remain in effect (meaning this test can be used) for the duration of the COVID-19 declaration under  Section 564(b)(1) of the Act, 21 U.S.C. section 360bbb-3(b)(1), unless the authorization is terminated or revoked sooner.  Performed at [facility] [address], [place], [place] [address]          Radiology Studies: No results found.      Scheduled Meds: . amoxicillin-clavulanate  1 tablet Oral Q12H  . cyanocobalamin  1,000 mcg Intramuscular Daily  . hydrOXYzine  25 mg Intramuscular Once  . lactose free nutrition  237 mL Oral BID BM  . levothyroxine  100 mcg Oral Q0600  . multivitamin with minerals  1 tablet Oral Daily  . pantoprazole  40 mg Oral BID  . sertraline  25 mg Oral Daily  . sodium chloride flush  3 mL Intravenous Q12H   Continuous Infusions:         Glade Lloyd, MD [facility] 08/21/2020, 10:34 AM

## 2020-08-22 ENCOUNTER — Encounter: Payer: Medicare Other | Admitting: Internal Medicine

## 2020-08-24 DIAGNOSIS — L89620 Pressure ulcer of left heel, unstageable: Secondary | ICD-10-CM | POA: Diagnosis not present

## 2020-08-24 DIAGNOSIS — E538 Deficiency of other specified B group vitamins: Secondary | ICD-10-CM | POA: Diagnosis not present

## 2020-08-24 DIAGNOSIS — E039 Hypothyroidism, unspecified: Secondary | ICD-10-CM | POA: Diagnosis not present

## 2020-08-24 DIAGNOSIS — R531 Weakness: Secondary | ICD-10-CM | POA: Diagnosis not present

## 2020-08-28 DIAGNOSIS — R230 Cyanosis: Secondary | ICD-10-CM | POA: Diagnosis not present

## 2020-08-28 DIAGNOSIS — R627 Adult failure to thrive: Secondary | ICD-10-CM | POA: Diagnosis not present

## 2020-08-28 DIAGNOSIS — M6281 Muscle weakness (generalized): Secondary | ICD-10-CM | POA: Diagnosis not present

## 2020-08-28 DIAGNOSIS — R52 Pain, unspecified: Secondary | ICD-10-CM | POA: Diagnosis not present

## 2020-08-29 ENCOUNTER — Non-Acute Institutional Stay: Payer: Medicare Other | Admitting: Hospice

## 2020-08-29 ENCOUNTER — Other Ambulatory Visit: Payer: Self-pay

## 2020-08-29 DIAGNOSIS — M25551 Pain in right hip: Secondary | ICD-10-CM | POA: Diagnosis not present

## 2020-08-29 DIAGNOSIS — Z515 Encounter for palliative care: Secondary | ICD-10-CM

## 2020-08-29 DIAGNOSIS — I480 Paroxysmal atrial fibrillation: Secondary | ICD-10-CM

## 2020-08-29 DIAGNOSIS — M6281 Muscle weakness (generalized): Secondary | ICD-10-CM | POA: Diagnosis not present

## 2020-08-29 DIAGNOSIS — R262 Difficulty in walking, not elsewhere classified: Secondary | ICD-10-CM | POA: Diagnosis not present

## 2020-08-29 NOTE — Progress Notes (Signed)
[facility] Consult Note Telephone: [phone]  Fax: [phone]  PATIENT NAME: Crystal Espinoza [address] [place] [address] [phone] (home)  DOB: [birth year] MRN: [id number]  PRIMARY CARE PROVIDER:    Deland Pretty, MD,  [address] [place] [address] [phone]  REFERRING PROVIDER:   Deland Pretty, [place],  [place] [address] [phone]  RESPONSIBLE PARTY:   Extended Emergency Contact Information Primary Emergency Contact: Kaleia, [place] Mobile Phone: [phone] Relation: Son Secondary Emergency Contact: Kaelea, [place] Mobile Phone: [phone] Relation: Daughter  I met face to face with patient and family in home/facility.  RECOMMENDATIONS/PLAN:    Visit at the request of Martinique Blattenberger, NP for palliative consult. Visit consisted of building trust and discussions on Palliative Medicine as specialized medical care for people living with serious illness, aimed at facilitating better quality of life through symptoms relief, assisting with advance care plan and establishing goals of care.  NP called and spoke with Elta Guadeloupe who endorsed palliative service; requesting that NP continue to see her at the facility.  Advance care planning/CODE STATUS: CODE STATUS reviewed today.  Elta Guadeloupe affirmed that patient is a DO NOT RESUSCITATE  GOALS OF CARE: Most selections include comfort measures, IV fluids for a defined trial.,  Antibiotics as determined when infection occurs, no feeding tube.  Goals of care include to maximize quality of life and symptom management.  Palliative care team will continue to support patient, patient's family, and medical team.  Signed DNR and MOST forms are in facility records and in epic.  Follow up Palliative Care Visit: Palliative care will continue to follow for goals of care clarification and symptom management.  Follow-up in a  month/as needed  Functional decline/Symptom Management: Recent hospitalization 11/2-11/05/2020 for acute metabolic encephalopathy with unclear cause, likely related to hypoglycemia, fever, per epic hospital encounter documentation.  Today patient afebrile, blood sugar 93, in no acute distress.    Pain:  She denied pain/discomfort. FLACC 0. Nursing reports occasional right arm pain well managed with Tylenol.  Fatigue/gait instability: Continue PT/OT.  Patient is a high fall risk.  Scheduled rounding recommended; continue fall precautions.  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   Family /Caregiver/Community Supports: Patient is in SNF for rehab.Strong family support system identified.  I spent 1 hour and 16 minutes providing this consultation; time includes time spent with patient/family, chart review, provider coordination,  and documentation. More than 50% of the time in this consultation was spent on coordinating communication  CHIEF COMPLAIN/HISTORY OF PRESENT ILLNESS:  Crystal Espinoza is a 86 y.o. female with multiple medical problems including recent acute metabolic encephalopathy,  Hx of crest syndrome, seizure, paroxysmal afibrillation. Palliative Care was asked to follow this patient by consultation request of Martinique Blattenberger NP to help address advance care planning and goals of care.  CODE STATUS: DO NOT RESUSCITATE  PPS: 40%   HOSPICE ELIGIBILITY/DIAGNOSIS: TBD  PAST MEDICAL HISTORY:  Past Medical History:  Diagnosis Date  . Cataract   . CREST syndrome ([place])   . Hypertension   . Nausea   . Paroxysmal atrial fibrillation ([place]) 02/02/2017  . Severe pulmonary hypertension ([place])    on cath 02/2017  . Symptomatic bradycardia 04/08/2017    SOCIAL HX:  Social  History    Tobacco Use  . Smoking status: Never Smoker  . Smokeless tobacco: Never Used  Substance Use Topics  . Alcohol use: No   FAMILY HX:  Family History  Problem Relation Age of Onset  . Heart disease Mother   . Stroke Father   . Stroke Sister   . Stroke Sister   . Cancer Sister   . Diabetes Sister     ALLERGIES:  Allergies  Allergen Reactions  . Levaquin [Levofloxacin] Other (See Comments)    unknown     PERTINENT MEDICATIONS:  Outpatient Encounter Medications as of 08/29/2020  Medication Sig  . amLODipine (NORVASC) 2.5 MG tablet Take 2.5 mg by mouth daily.  Marland Kitchen levothyroxine (SYNTHROID) 100 MCG tablet Take 100 mcg by mouth daily.   . pantoprazole (PROTONIX) 40 MG tablet Take 1 tablet (40 mg total) by mouth daily.  . sertraline (ZOLOFT) 25 MG tablet Take 1 tablet (25 mg total) by mouth daily.  Marland Kitchen triamterene-hydrochlorothiazide (MAXZIDE-25) 37.5-25 MG tablet Take 0.5 tablets by mouth every morning.  . vitamin B-12 (CYANOCOBALAMIN) 1000 MCG tablet Take 1 tablet (1,000 mcg total) by mouth daily.   No facility-administered encounter medications on file as of 08/29/2020.    PHYSICAL EXAM / ROS: Height:  5 feet 3 inches     Weight:117.5Ibs General: In no acute distress, cooperative Cardiovascular: regular rate and rhythm, no chest pain reported Pulmonary: no cough, no shortness of breath, clear ant/post fields, normal respiratory effort Abdomen: soft, non tender, positive bowel sounds in all quadrants GU: denies dysuria, no suprapubic tenderness Extremities: no edema, no joint deformities, cold fingers/toes- related to crest syndrome Skin:dry skin,  no rashes to exposed skin Neurological: Weakness but otherwise non focal  Teodoro Spray, NP

## 2020-08-31 DIAGNOSIS — L89620 Pressure ulcer of left heel, unstageable: Secondary | ICD-10-CM | POA: Diagnosis not present

## 2020-09-01 ENCOUNTER — Emergency Department (HOSPITAL_COMMUNITY)
Admission: EM | Admit: 2020-09-01 | Discharge: 2020-09-01 | Disposition: A | Discharge status: Home / Self Care | Payer: Medicare Other | Attending: Emergency Medicine | Admitting: Emergency Medicine

## 2020-09-01 ENCOUNTER — Emergency Department (HOSPITAL_COMMUNITY): Payer: Medicare Other

## 2020-09-01 ENCOUNTER — Other Ambulatory Visit: Payer: Self-pay

## 2020-09-01 DIAGNOSIS — Z66 Do not resuscitate: Secondary | ICD-10-CM | POA: Diagnosis not present

## 2020-09-01 DIAGNOSIS — Z950 Presence of cardiac pacemaker: Secondary | ICD-10-CM | POA: Insufficient documentation

## 2020-09-01 DIAGNOSIS — G9389 Other specified disorders of brain: Secondary | ICD-10-CM | POA: Diagnosis not present

## 2020-09-01 DIAGNOSIS — M25552 Pain in left hip: Secondary | ICD-10-CM | POA: Insufficient documentation

## 2020-09-01 DIAGNOSIS — M7989 Other specified soft tissue disorders: Secondary | ICD-10-CM | POA: Diagnosis not present

## 2020-09-01 DIAGNOSIS — I480 Paroxysmal atrial fibrillation: Secondary | ICD-10-CM | POA: Diagnosis not present

## 2020-09-01 DIAGNOSIS — R918 Other nonspecific abnormal finding of lung field: Secondary | ICD-10-CM | POA: Diagnosis not present

## 2020-09-01 DIAGNOSIS — Z90710 Acquired absence of both cervix and uterus: Secondary | ICD-10-CM | POA: Diagnosis not present

## 2020-09-01 DIAGNOSIS — S42002A Fracture of unspecified part of left clavicle, initial encounter for closed fracture: Secondary | ICD-10-CM | POA: Diagnosis not present

## 2020-09-01 DIAGNOSIS — S4991XA Unspecified injury of right shoulder and upper arm, initial encounter: Secondary | ICD-10-CM | POA: Diagnosis present

## 2020-09-01 DIAGNOSIS — S42032A Displaced fracture of lateral end of left clavicle, initial encounter for closed fracture: Secondary | ICD-10-CM | POA: Diagnosis not present

## 2020-09-01 DIAGNOSIS — I5022 Chronic systolic (congestive) heart failure: Secondary | ICD-10-CM | POA: Insufficient documentation

## 2020-09-01 DIAGNOSIS — W19XXXA Unspecified fall, initial encounter: Secondary | ICD-10-CM | POA: Diagnosis not present

## 2020-09-01 DIAGNOSIS — S0990XA Unspecified injury of head, initial encounter: Secondary | ICD-10-CM | POA: Diagnosis not present

## 2020-09-01 DIAGNOSIS — S42122A Displaced fracture of acromial process, left shoulder, initial encounter for closed fracture: Secondary | ICD-10-CM | POA: Diagnosis not present

## 2020-09-01 DIAGNOSIS — I110 Hypertensive heart disease with heart failure: Secondary | ICD-10-CM | POA: Insufficient documentation

## 2020-09-01 DIAGNOSIS — W06XXXA Fall from bed, initial encounter: Secondary | ICD-10-CM | POA: Diagnosis not present

## 2020-09-01 DIAGNOSIS — M25512 Pain in left shoulder: Secondary | ICD-10-CM | POA: Diagnosis not present

## 2020-09-01 DIAGNOSIS — M419 Scoliosis, unspecified: Secondary | ICD-10-CM | POA: Diagnosis not present

## 2020-09-01 DIAGNOSIS — I4891 Unspecified atrial fibrillation: Secondary | ICD-10-CM | POA: Diagnosis not present

## 2020-09-01 DIAGNOSIS — S199XXA Unspecified injury of neck, initial encounter: Secondary | ICD-10-CM | POA: Diagnosis not present

## 2020-09-01 DIAGNOSIS — M6281 Muscle weakness (generalized): Secondary | ICD-10-CM | POA: Diagnosis not present

## 2020-09-01 DIAGNOSIS — S42002D Fracture of unspecified part of left clavicle, subsequent encounter for fracture with routine healing: Secondary | ICD-10-CM | POA: Diagnosis not present

## 2020-09-01 DIAGNOSIS — I6389 Other cerebral infarction: Secondary | ICD-10-CM | POA: Diagnosis not present

## 2020-09-01 MED ORDER — ACETAMINOPHEN 325 MG PO TABS
650.0000 mg | ORAL_TABLET | Freq: Once | ORAL | Status: AC
  Administered 2020-09-01: 650 mg via ORAL
  Filled 2020-09-01: qty 2

## 2020-09-01 NOTE — ED Notes (Signed)
PTAR transportatiom arranged at this time.

## 2020-09-01 NOTE — ED Provider Notes (Signed)
[facility] Provider Note   CSN: [id number] Arrival date & time: 09/01/20  0515     History Chief Complaint  Patient presents with  . Fall    Crystal Espinoza is a 86 y.o. female with a history of dementia, crest syndrome, hypertension, and paroxysmal atrial fibrillation who [facility] the emergency department [facility] with a chief complaint of fall.  The patient reports that she rolled out of bed and landed on her left shoulder just prior to arrival.  EMS noted swelling to the left shoulder.  She is endorsing pain to the left shoulder and the left hip.  No LOC, nausea, or vomiting.  No treatment prior to arrival.  She is not anticoagulated.  She is a DNR.  Level V caveat secondary to dementia.  She is alert and oriented x1.  The history is provided by the patient and medical records. No language interpreter was used.       Past Medical History:  Diagnosis Date  . Cataract   . CREST syndrome (HCC)   . Hypertension   . Nausea   . Paroxysmal atrial fibrillation (H[facility]) 02/02/2017  . Severe pulmonary hypertension (H[facility])    on cath 02/2017  . Symptomatic bradycardia 04/08/2017    Patient Active Problem List   Diagnosis Date Noted  . Advanced care planning/counseling discussion   . Goals of care, counseling/discussion   . Palliative care by specialist   . Abdominal pain 08/15/2020  . Fever 08/15/2020  . Chronic systolic CHF (congestive heart failure) (HCC) 08/15/2020  . Hypoglycemia without diagnosis of diabetes mellitus 06/05/2020  . Encephalopathy 06/05/2020  . Acute encephalopathy 06/04/2020  . Seizure (HCC)   . CVA (cerebral vascular accident) (HCC) 01/20/2020  . AMS (altered mental status) 01/20/2020  . SOB (shortness of breath)   . Pacemaker 06/30/2019  . Symptomatic bradycardia 04/08/2017  . Paroxysmal atrial fibrillation (HCC) 02/02/2017  . RBBB 02/27/2014  . PAC (premature atrial  contraction) 02/27/2014  . CREST syndrome (HCC) 02/27/2014    Past Surgical History:  Procedure Laterality Date  . ABDOMINAL HYSTERECTOMY    . EYE SURGERY    . PACEMAKER IMPLANT N/A 04/08/2017   Procedure: Pacemaker Implant;  Surgeon: Marinus Mawaylor, Gregg W, MD;  [facility];  Service: Cardiovascular;  Laterality: N/A;     OB History   No obstetric history on file.     Family History  Problem Relation Age of Onset  . Heart disease Mother   . Stroke Father   . Stroke Sister   . Stroke Sister   . Cancer Sister   . Diabetes Sister     Social History   Tobacco Use  . Smoking status: Never Smoker  . Smokeless tobacco: Never Used  Vaping Use  . Vaping Use: Never used  Substance Use Topics  . Alcohol use: No  . Drug use: No    Home Medications Prior to Admission medications   Medication Sig Start Date End Date Taking? Authorizing Provider  amLODipine (NORVASC) 2.5 MG tablet Take 2.5 mg by mouth daily. 08/08/20   [provider]  levothyroxine (SYNTHROID) 100 MCG tablet Take 100 mcg by mouth daily.     [provider]  pantoprazole (PROTONIX) 40 MG tablet Take 1 tablet (40 mg total) by mouth daily. 08/21/20   Glade LloydAlekh, Kshitiz, MD  sertraline (ZOLOFT) 25 MG tablet Take 1 tablet (25 mg total) by mouth daily. 08/22/20  Glade Lloyd, MD  triamterene-hydrochlorothiazide (MAXZIDE-25) 37.5-25 MG tablet Take 0.5 tablets by mouth every morning. 08/08/20   [provider]  vitamin B-12 (CYANOCOBALAMIN) 1000 MCG tablet Take 1 tablet (1,000 mcg total) by mouth daily. 08/21/20   Glade Lloyd, MD    Allergies    Levaquin [levofloxacin]  Review of Systems   Review of Systems  Unable to perform ROS: Dementia    Physical Exam Updated Vital Signs BP (!) 148/68   Pulse (!) 59   Temp 97.8 F (36.6 C) (Axillary)   Resp 14   SpO2 100%   Physical Exam Vitals and nursing note reviewed.  Constitutional:      General: She is not in acute  distress.    Appearance: She is not diaphoretic.     Comments: Elderly female in no acute distress.  HENT:     Head: Normocephalic.  Eyes:     Conjunctiva/sclera: Conjunctivae normal.  Cardiovascular:     Rate and Rhythm: Normal rate and regular rhythm.     Heart sounds: No murmur heard.  No friction rub. No gallop.   Pulmonary:     Effort: Pulmonary effort is normal. No respiratory distress.     Breath sounds: No stridor. No wheezing, rhonchi or rales.     Comments: Chest wall is nontender.  No obvious wounds or bruises.  No crepitus or step-offs. Chest:     Chest wall: No tenderness.  Abdominal:     General: There is no distension.     Palpations: Abdomen is soft. There is no mass.     Tenderness: There is no abdominal tenderness. There is no right CVA tenderness, left CVA tenderness, guarding or rebound.     Hernia: No hernia is present.     Comments: Abdomen and flank are soft and nontender.  No obvious wounds or bruises.  Musculoskeletal:     Cervical back: Neck supple.     Right lower leg: No edema.     Left lower leg: No edema.     Comments: Obvious deformity noted to the distal left clavicle.  No skin tenting.  Decreased range of motion of the left shoulder secondary to pain.  Left shoulder is nontender.  Left elbow and wrist with full active and passive range of motion no tenderness to palpation.  Spine is nontender.  She has tenderness to palpation over the lateral hip on the left.  Pain is mildly increased with range of motion of the left hip.  Left knee and ankle with full active and passive range of motion no tenderness to palpation.  She is neurovascularly intact throughout.  Skin:    General: Skin is warm.     Capillary Refill: Capillary refill takes less than 2 seconds.     Findings: No rash.  Neurological:     Mental Status: She is alert.  Psychiatric:        Behavior: Behavior normal.     ED Results / Procedures / Treatments   Labs (all labs ordered are  listed, but only abnormal results are displayed) Labs Reviewed - No data to display  EKG None  Radiology CT Head Wo Contrast  Result Date: 09/01/2020 CLINICAL DATA:  Fall, head injury EXAM: CT HEAD WITHOUT CONTRAST TECHNIQUE: Contiguous axial images were obtained from the base of the skull through the vertex without intravenous contrast. COMPARISON:  08/16/2020 FINDINGS: Brain: Normal anatomic configuration. Parenchymal volume loss is commensurate with the patient's age. Extensive subcortical and periventricular periventricular white matter  changes are present likely reflecting the sequela of small vessel ischemia. Remote lacunar infarcts are noted within the thalami bilaterally no abnormal intra or extra-axial mass lesion or fluid collection. No abnormal mass effect or midline shift. No evidence of acute intracranial hemorrhage or infarct. Ventricular size is normal. Cerebellum unremarkable. Vascular: No asymmetric hyperdense vasculature at the skull base. Skull: Intact Sinuses/Orbits: Small fluid again noted within the sphenoid sinuses, similar to prior examination. Remaining paranasal sinuses are clear. Orbits are unremarkable. Other: Mastoid air cells and middle ear cavities are clear. IMPRESSION: No evidence of acute intracranial hemorrhage or infarct. Stable extensive probable microvascular ischemic change Remote bilateral thalamic infarcts Electronically Signed   By: Helyn Numbers MD   On: 09/01/2020 06:27   CT Cervical Spine Wo Contrast  Result Date: 09/01/2020 CLINICAL DATA:  Head trauma, minor. EXAM: CT CERVICAL SPINE WITHOUT CONTRAST TECHNIQUE: Multidetector CT imaging of the cervical spine was performed without intravenous contrast. Multiplanar CT image reconstructions were also generated. COMPARISON:  None similar FINDINGS: Alignment: No traumatic malalignment.  Levoscoliosis. Skull base and vertebrae: No acute fracture Soft tissues and spinal canal: No prevertebral fluid or swelling. No  visible canal hematoma. Disc levels: Multilevel disc and facet degeneration asymmetric right-sided facet spurring along the concavity of the scoliosis. Upper chest: Faint ground-glass density seen in the right upper lobe, nonspecific in this setting. Patulous upper thoracic esophagus, potentially transient. IMPRESSION: 1. Negative for cervical spine fracture. 2. Partial coverage of ground-glass opacity in the right upper lobe, consider chest x-ray if there are chest symptoms. Electronically Signed   By: Marnee Spring M.D.   On: 09/01/2020 06:41   DG Shoulder Left  Result Date: 09/01/2020 CLINICAL DATA:  Injury.  Swelling and pain. EXAM: LEFT SHOULDER - 2+ VIEW COMPARISON:  Chest x-ray 08/15/2020, 01/20/2020,. FINDINGS: Cardiac pacer noted stable position. Diffuse osteopenia. Acromioclavicular and glenohumeral degenerative change. Calcific changes again noted over supraspinatus tendon consistent with calcific supraspinatus tendinosis. Tiny bony density noted along the inferior portion of the glenoid, similar finding noted on prior chest x-ray of 01/20/2020. This could represent a tiny loose body or old fracture fragment. Acute distal left clavicular slightly displaced fracture. No evidence of shoulder dislocation or separation. IMPRESSION: 1. Acute distal left clavicular fracture with mild displacement. No evidence of shoulder dislocation or separation. 2. Diffuse osteopenia. Acromioclavicular glenohumeral and degenerative change. Tiny loose body versus old fracture fragment noted along the inferior glenoid. 3. Calcific supraspinatus tendinosis. Electronically Signed   By: Maisie Fus  Register   On: 09/01/2020 06:30   DG HIP UNILAT WITH PELVIS 2-3 VIEWS LEFT  Result Date: 09/01/2020 CLINICAL DATA:  Fall, left hip pain EXAM: DG HIP (WITH OR WITHOUT PELVIS) 2-3V LEFT COMPARISON:  07/22/2008 FINDINGS: Single view radiograph of the pelvis and two view radiograph of the left hip demonstrates moderate to severe  bilateral degenerative hip arthritis. This has progressed significantly since prior examination. No acute fracture or dislocation. Degenerative changes are seen within the lumbar spine, not well profiled. Vascular calcifications are seen within the pelvis. IMPRESSION: No acute fracture or dislocation. Moderate to severe bilateral degenerative hip arthritis. Electronically Signed   By: Helyn Numbers MD   On: 09/01/2020 06:30    Procedures Procedures (including critical care time)  Medications Ordered in ED Medications  acetaminophen (TYLENOL) tablet 650 mg (650 mg Oral Given 09/01/20 0017)    ED Course  I have reviewed the triage vital signs and the nursing notes.  Pertinent labs & imaging results that were available  during my care of the patient were reviewed by me and considered in my medical decision making (see chart for details).    MDM Rules/Calculators/A&P                          86 year old female with a history of dementia, crest syndrome, hypertension, and paroxysmal atrial fibrillation presenting by EMS from [facility] home after she fell out of bed onto her left side.  No LOC or vomiting.  No trauma to the head.  She is not on blood thinners.  She is a DNR.  The patient has been seen and independently evaluated by Dr. Elesa Massed, attending physician.  Vital signs are stable.  She is in no acute distress.  She has an obvious deformity to the distal left clavicle.  There is also mild, diffuse tenderness to the left hip.  She is neurovascular intact throughout.  No trauma noted to the head, thorax, or abdomen.  Tylenol given for pain control.  Images have been ordered and independently interpreted by me.  Left shoulder x-ray revealing a distal clavicular fracture on the left with mild displacement.  Pelvis and left hip x-ray are unremarkable.  CT head and cervical spine are unremarkable.  Doubt CVA or spine fracture.  We will place the patient in a sling and she can follow-up with  her PCP.  Following Tylenol, she is not complaining of pain.  She is requesting a coffee.  At this time, she is hemodynamically stable and in no acute distress.  Safe for discharge to [facility] at this time.  ER return precautions given.  Final Clinical Impression(s) / ED Diagnoses Final diagnoses:  Fall, initial encounter  Closed traumatic minimally displaced fracture of acromial end of left clavicle, initial encounter    Rx / DC Orders ED Discharge Orders    None       Suan Pyeatt A, PA-C 09/01/20 0809    Ward, Layla Maw, DO 09/01/20 2300

## 2020-09-01 NOTE — ED Notes (Signed)
Patient transported to CT 

## 2020-09-01 NOTE — ED Triage Notes (Signed)
PT BIBA from [facility] after she rolled out of bed landing on her left shoulder. Pt not on thinners. Left shoulder swelling assessed. Pain present when moving extremity. Pt basleine A&OX1.

## 2020-09-01 NOTE — Discharge Instructions (Addendum)
Thank you for allowing me to care for you today in the Emergency Department.  You were seen for a fall today.  You were diagnosed with a fracture of your left clavicle.  This is treated by wearing a sling.  You can follow-up with primary care for this for evaluation.  Your work-up was otherwise reassuring.  Return to the emergency department if you have any fall or injury, new numbness or weakness, or other new, concerning symptoms.

## 2020-09-01 NOTE — Progress Notes (Signed)
Orthopedic Tech Progress Note Patient Details:  Crystal Espinoza 02/26/1929 [id number]  Ortho Devices Type of Ortho Device: Arm sling Ortho Device/Splint Location: LUE Ortho Device/Splint Interventions: Ordered, Application   Post Interventions Patient Tolerated: Well Instructions Provided: Care of device   Donald Pore 09/01/2020, 7:24 AM

## 2020-09-01 NOTE — ED Notes (Signed)
Cleaned patient of urine and placed clean brief on patient. Pt complaining of shoulder pain. Re-Educated  Patient on broken clavicle.

## 2020-09-04 DIAGNOSIS — S42032D Displaced fracture of lateral end of left clavicle, subsequent encounter for fracture with routine healing: Secondary | ICD-10-CM | POA: Diagnosis not present

## 2020-09-04 DIAGNOSIS — W19XXXD Unspecified fall, subsequent encounter: Secondary | ICD-10-CM | POA: Diagnosis not present

## 2020-09-04 DIAGNOSIS — M6281 Muscle weakness (generalized): Secondary | ICD-10-CM | POA: Diagnosis not present

## 2020-09-07 DIAGNOSIS — L89620 Pressure ulcer of left heel, unstageable: Secondary | ICD-10-CM | POA: Diagnosis not present

## 2020-09-11 DIAGNOSIS — D518 Other vitamin B12 deficiency anemias: Secondary | ICD-10-CM | POA: Diagnosis not present

## 2020-09-11 DIAGNOSIS — E039 Hypothyroidism, unspecified: Secondary | ICD-10-CM | POA: Diagnosis not present

## 2020-09-11 DIAGNOSIS — I480 Paroxysmal atrial fibrillation: Secondary | ICD-10-CM | POA: Diagnosis not present

## 2020-09-11 DIAGNOSIS — I10 Essential (primary) hypertension: Secondary | ICD-10-CM | POA: Diagnosis not present

## 2020-09-11 DIAGNOSIS — Z950 Presence of cardiac pacemaker: Secondary | ICD-10-CM | POA: Diagnosis not present

## 2020-09-13 DIAGNOSIS — R1312 Dysphagia, oropharyngeal phase: Secondary | ICD-10-CM | POA: Diagnosis not present

## 2020-09-13 DIAGNOSIS — U071 COVID-19: Secondary | ICD-10-CM | POA: Diagnosis not present

## 2020-09-13 DIAGNOSIS — R531 Weakness: Secondary | ICD-10-CM | POA: Diagnosis not present

## 2020-09-13 DIAGNOSIS — L89626 Pressure-induced deep tissue damage of left heel: Secondary | ICD-10-CM | POA: Diagnosis not present

## 2020-09-13 DIAGNOSIS — I5022 Chronic systolic (congestive) heart failure: Secondary | ICD-10-CM | POA: Diagnosis not present

## 2020-09-13 DIAGNOSIS — G9341 Metabolic encephalopathy: Secondary | ICD-10-CM | POA: Diagnosis not present

## 2020-09-13 DIAGNOSIS — I110 Hypertensive heart disease with heart failure: Secondary | ICD-10-CM | POA: Diagnosis not present

## 2020-09-13 DIAGNOSIS — L89616 Pressure-induced deep tissue damage of right heel: Secondary | ICD-10-CM | POA: Diagnosis not present

## 2020-09-13 DIAGNOSIS — I480 Paroxysmal atrial fibrillation: Secondary | ICD-10-CM | POA: Diagnosis not present

## 2020-09-14 DIAGNOSIS — R1312 Dysphagia, oropharyngeal phase: Secondary | ICD-10-CM | POA: Diagnosis not present

## 2020-09-14 DIAGNOSIS — L89616 Pressure-induced deep tissue damage of right heel: Secondary | ICD-10-CM | POA: Diagnosis not present

## 2020-09-14 DIAGNOSIS — I480 Paroxysmal atrial fibrillation: Secondary | ICD-10-CM | POA: Diagnosis not present

## 2020-09-14 DIAGNOSIS — I110 Hypertensive heart disease with heart failure: Secondary | ICD-10-CM | POA: Diagnosis not present

## 2020-09-14 DIAGNOSIS — R531 Weakness: Secondary | ICD-10-CM | POA: Diagnosis not present

## 2020-09-14 DIAGNOSIS — G9341 Metabolic encephalopathy: Secondary | ICD-10-CM | POA: Diagnosis not present

## 2020-09-14 DIAGNOSIS — L89626 Pressure-induced deep tissue damage of left heel: Secondary | ICD-10-CM | POA: Diagnosis not present

## 2020-09-14 DIAGNOSIS — I5022 Chronic systolic (congestive) heart failure: Secondary | ICD-10-CM | POA: Diagnosis not present

## 2020-09-17 DIAGNOSIS — Y92129 Unspecified place in nursing home as the place of occurrence of the external cause: Secondary | ICD-10-CM | POA: Diagnosis not present

## 2020-09-17 DIAGNOSIS — W19XXXA Unspecified fall, initial encounter: Secondary | ICD-10-CM | POA: Diagnosis not present

## 2020-09-17 DIAGNOSIS — W01198A Fall on same level from slipping, tripping and stumbling with subsequent striking against other object, initial encounter: Secondary | ICD-10-CM | POA: Diagnosis not present

## 2020-09-17 DIAGNOSIS — S0990XA Unspecified injury of head, initial encounter: Secondary | ICD-10-CM | POA: Diagnosis not present

## 2020-09-17 DIAGNOSIS — M79643 Pain in unspecified hand: Secondary | ICD-10-CM | POA: Diagnosis not present

## 2020-09-17 DIAGNOSIS — R079 Chest pain, unspecified: Secondary | ICD-10-CM | POA: Diagnosis not present

## 2020-09-17 DIAGNOSIS — T148XXA Other injury of unspecified body region, initial encounter: Secondary | ICD-10-CM | POA: Diagnosis not present

## 2020-09-17 DIAGNOSIS — R404 Transient alteration of awareness: Secondary | ICD-10-CM | POA: Diagnosis not present

## 2020-09-17 DIAGNOSIS — Y998 Other external cause status: Secondary | ICD-10-CM | POA: Diagnosis not present

## 2020-09-17 DIAGNOSIS — Z743 Need for continuous supervision: Secondary | ICD-10-CM | POA: Diagnosis not present

## 2020-09-17 DIAGNOSIS — R519 Headache, unspecified: Secondary | ICD-10-CM | POA: Diagnosis not present

## 2020-09-19 DIAGNOSIS — R531 Weakness: Secondary | ICD-10-CM | POA: Diagnosis not present

## 2020-09-19 DIAGNOSIS — E039 Hypothyroidism, unspecified: Secondary | ICD-10-CM | POA: Diagnosis not present

## 2020-09-19 DIAGNOSIS — R29818 Other symptoms and signs involving the nervous system: Secondary | ICD-10-CM | POA: Diagnosis not present

## 2020-09-19 DIAGNOSIS — Z743 Need for continuous supervision: Secondary | ICD-10-CM | POA: Diagnosis not present

## 2020-09-19 DIAGNOSIS — Z7401 Bed confinement status: Secondary | ICD-10-CM | POA: Diagnosis not present

## 2020-09-19 DIAGNOSIS — R778 Other specified abnormalities of plasma proteins: Secondary | ICD-10-CM | POA: Diagnosis not present

## 2020-09-19 DIAGNOSIS — I639 Cerebral infarction, unspecified: Secondary | ICD-10-CM | POA: Diagnosis not present

## 2020-09-19 DIAGNOSIS — E161 Other hypoglycemia: Secondary | ICD-10-CM | POA: Diagnosis not present

## 2020-09-19 DIAGNOSIS — R4020 Unspecified coma: Secondary | ICD-10-CM | POA: Diagnosis not present

## 2020-09-19 DIAGNOSIS — R404 Transient alteration of awareness: Secondary | ICD-10-CM | POA: Diagnosis not present

## 2020-09-19 DIAGNOSIS — I499 Cardiac arrhythmia, unspecified: Secondary | ICD-10-CM | POA: Diagnosis not present

## 2020-09-19 DIAGNOSIS — R569 Unspecified convulsions: Secondary | ICD-10-CM | POA: Diagnosis not present

## 2020-09-19 DIAGNOSIS — M2550 Pain in unspecified joint: Secondary | ICD-10-CM | POA: Diagnosis not present

## 2020-09-19 DIAGNOSIS — E162 Hypoglycemia, unspecified: Secondary | ICD-10-CM | POA: Diagnosis not present

## 2020-09-20 ENCOUNTER — Telehealth: Payer: Self-pay | Admitting: Internal Medicine

## 2020-09-20 DIAGNOSIS — R404 Transient alteration of awareness: Secondary | ICD-10-CM | POA: Diagnosis not present

## 2020-09-20 DIAGNOSIS — U071 COVID-19: Secondary | ICD-10-CM | POA: Diagnosis not present

## 2020-10-14 DIAGNOSIS — 419620001 Death: Secondary | SNOMED CT | POA: Diagnosis not present

## 2020-10-14 NOTE — Telephone Encounter (Signed)
09/19/20  Phone conversation with DIL Natalia Leatherwood who reports that patient had been taken to the hospital today due to altered level of consciousness with a suspected CVA.  She also reports that her son desires comfort care only for this patient.  She is unsure to whether or not patient has returned to her memory care facility([facility] in [place]).  Plan on contact with facility on 2020/10/16 to determine patient status and needs for the immediate future followed with communication with POA.  Margaretha Sheffield, NP-C

## 2020-10-14 DEATH — deceased

## 2020-11-21 ENCOUNTER — Encounter (HOSPITAL_COMMUNITY): Payer: Self-pay | Admitting: Family Medicine

## 2021-04-06 IMAGING — CT CT HEAD W/O CM
4 series · 17 of 47 positions shown, 19 images · non-contrast
Comparison: CT head, CTA and CTP 4 /[DATE].

CLINICAL DATA: 89-year-old female code stroke presentation two days
ago with age indeterminate cervical right ICA occlusion.

EXAM:
CT HEAD WITHOUT CONTRAST
TECHNIQUE: Contiguous axial images were obtained from the base of the skull
through the vertex without intravenous contrast.

[Series 3: head wo · axial · 0.43mm/px · z∈[+1066,+1190]mm · 7 of 35 slices shown, 9 images]
[im 5/35  brain]
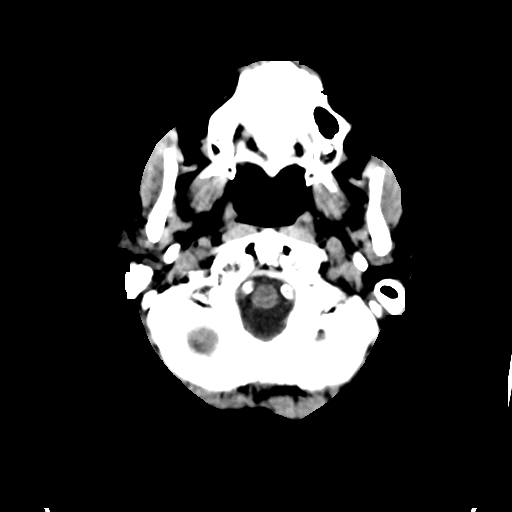
[im 5/35  bone]
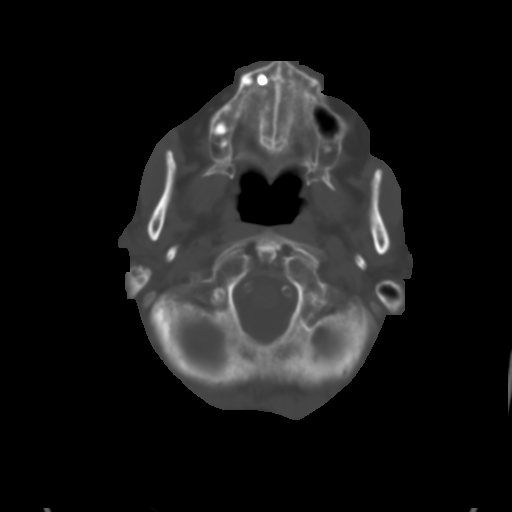
[im 9/35  brain]
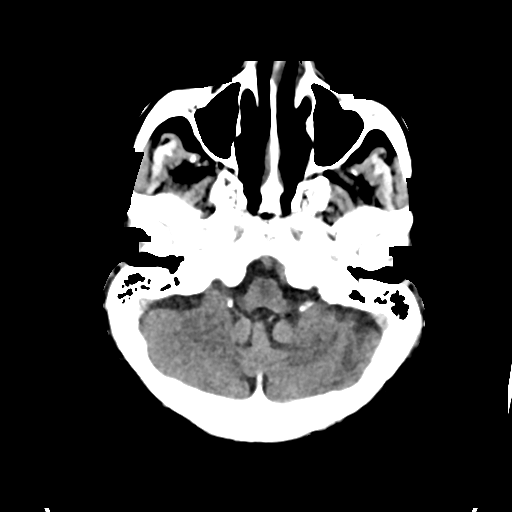
[im 13/35  brain]
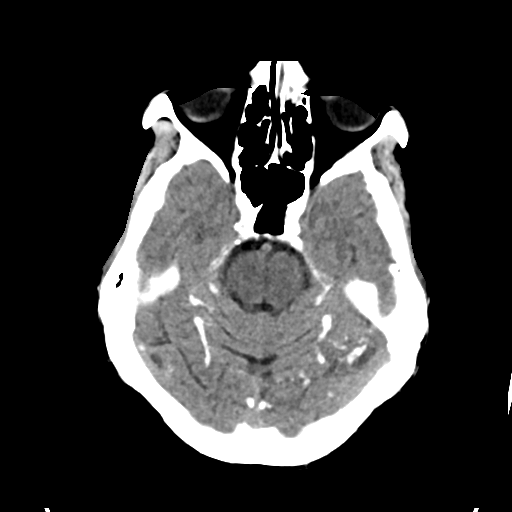
[im 18/35  brain]
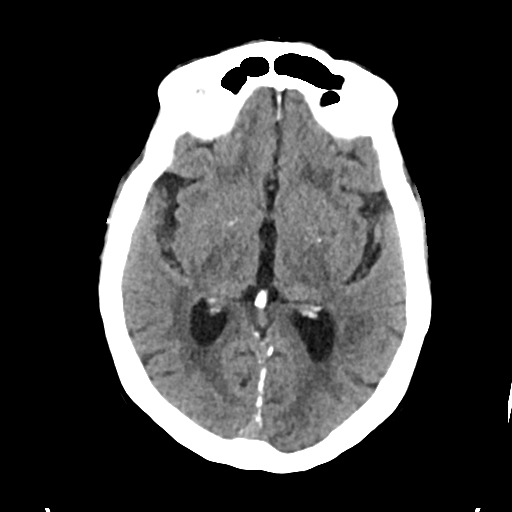
[im 22/35  brain]
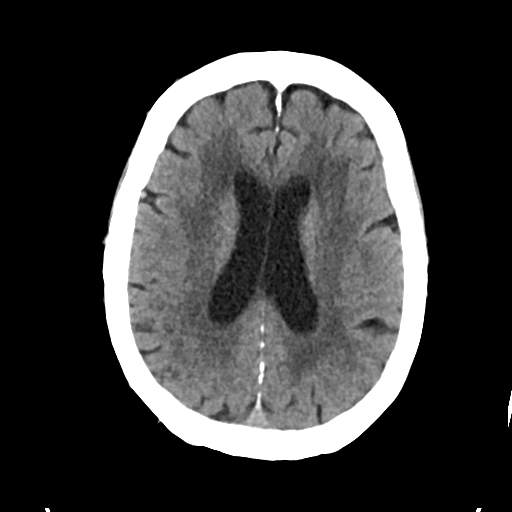
[im 22/35  bone]
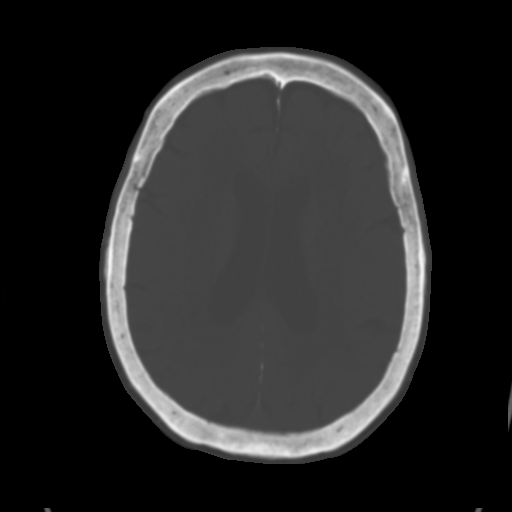
[im 26/35  brain]
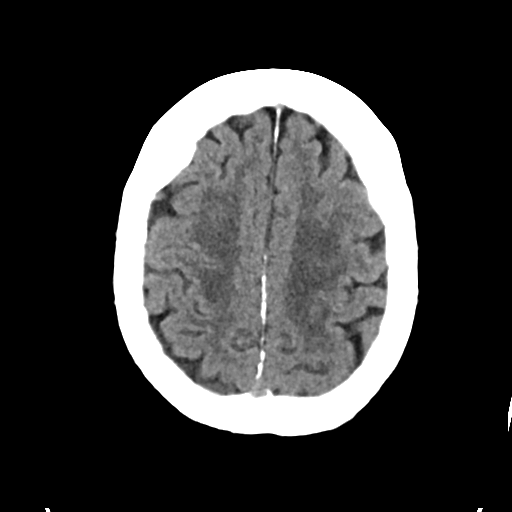
[im 30/35  brain]
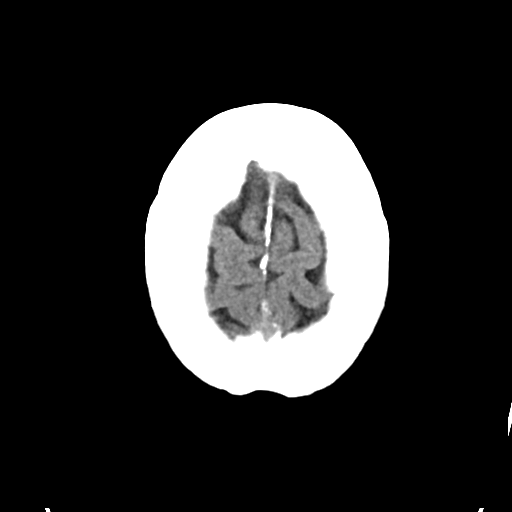

[Series 4: head bone · axial · 0.43mm/px · z∈[+1062,+1124]mm · 4 of 88 slices shown]
[im 9/88  bone]
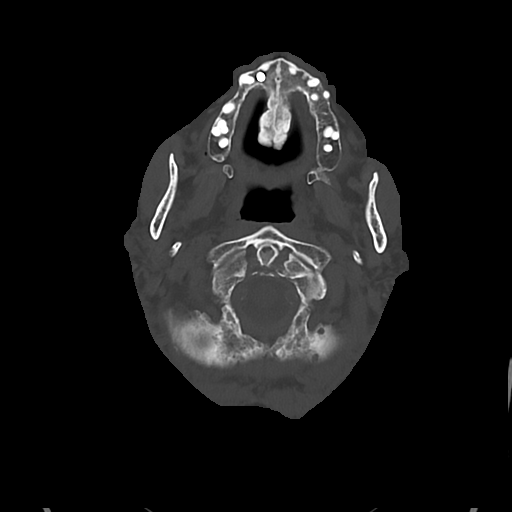
[im 18/88  bone]
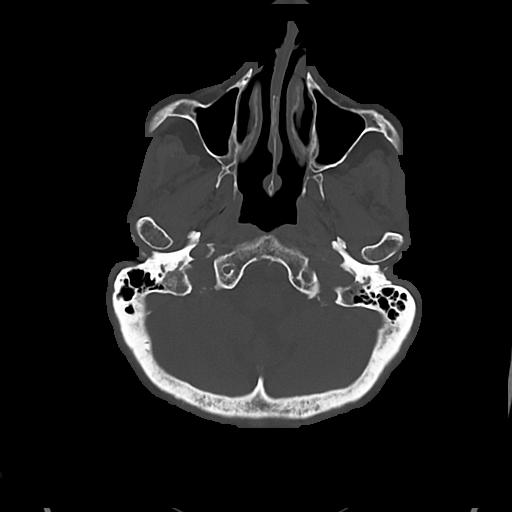
[im 27/88  bone]
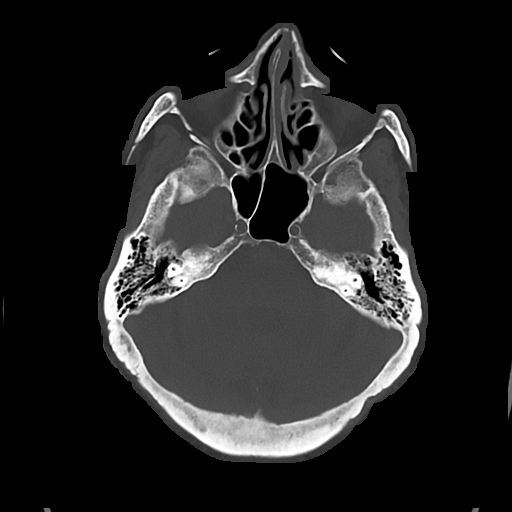
[im 40/88  bone]
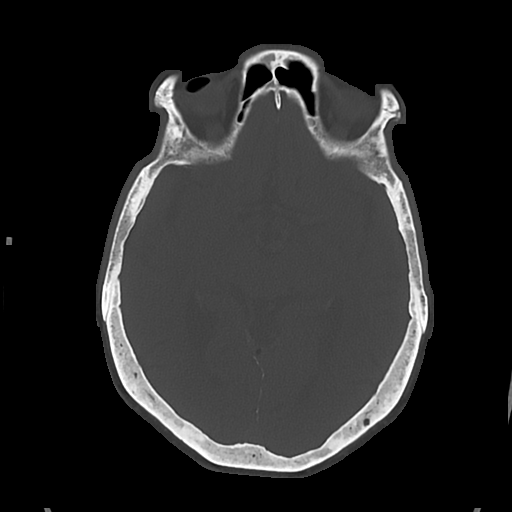

[Series 5: cor soft · coronal · 0.34mm/px · 3 of 66 slices shown]
[im 22/66  brain]
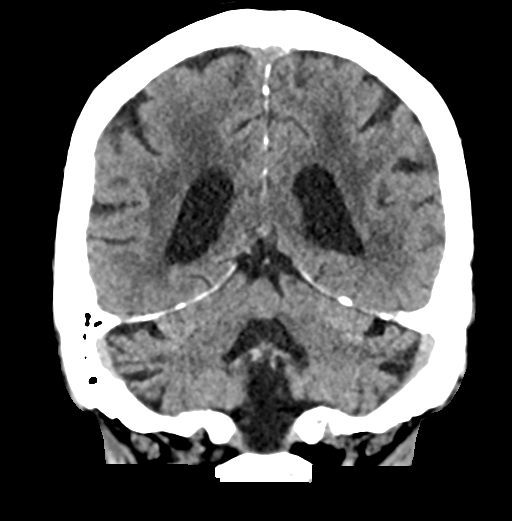
[im 29/66  brain]
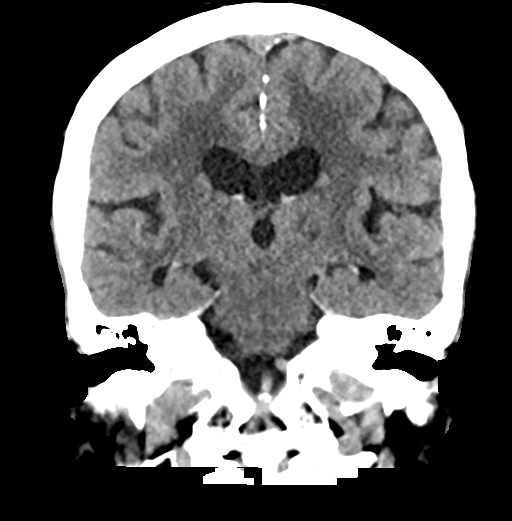
[im 37/66  brain]
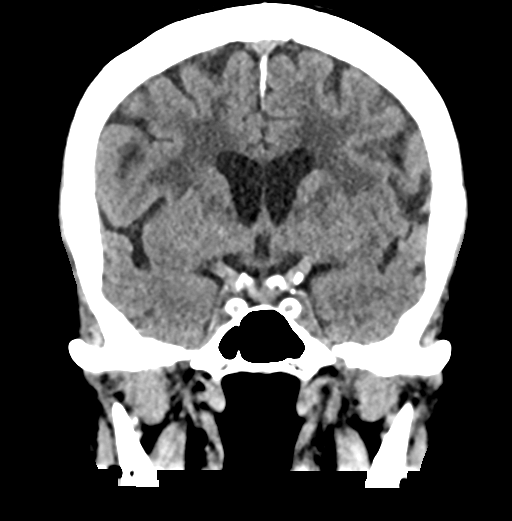

[Series 6: sag soft · sagittal · 0.35mm/px · 3 of 57 slices shown]
[im 19/57  brain]
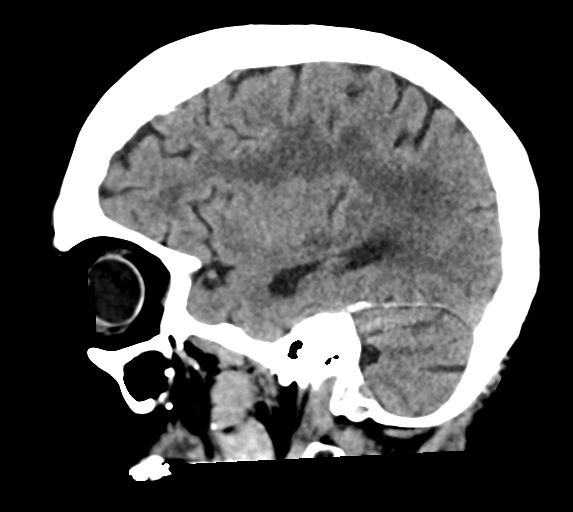
[im 29/57  brain]
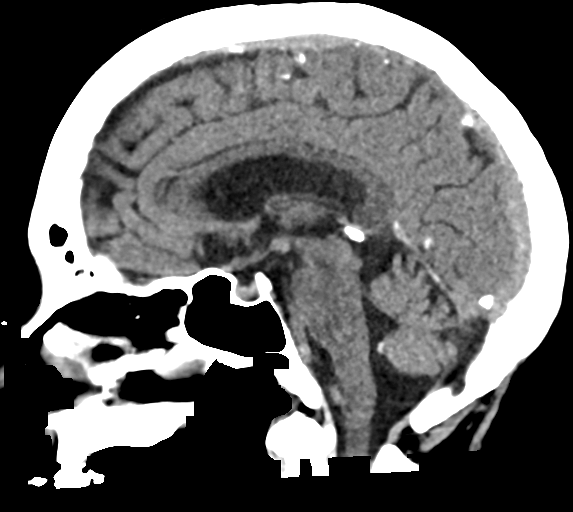
[im 38/57  brain]
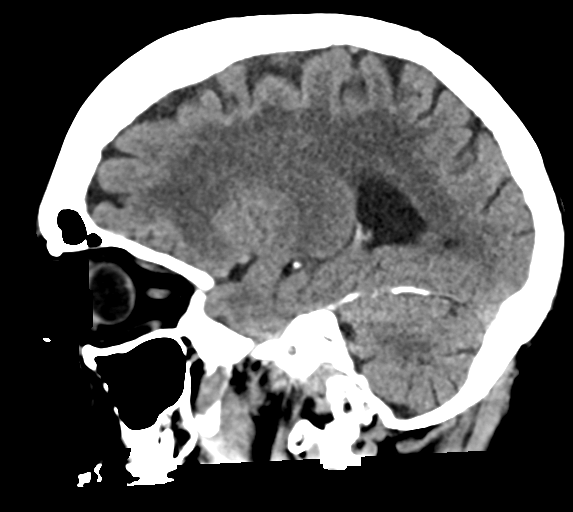

[17 of 47 positions shown; findings below may reference images not displayed]

FINDINGS: Brain: Advanced hypodensity in the bilateral cerebral white matter
and deep gray nuclei appears stable. Arachnoid granulation versus
small area of chronic encephalomalacia at the right lateral 6
cerebellum 6 is stable.

No acute intracranial hemorrhage identified. No midline shift, mass
effect, or evidence of intracranial mass lesion. No
ventriculomegaly.

No cortically based acute infarct identified. No suspicious
intracranial vascular hyperdensity.

Vascular: Extensive Calcified atherosclerosis at the skull base.

Skull: No acute osseous abnormality identified.

Sinuses/Orbits: Visualized paranasal sinuses and mastoids are stable
and well pneumatized.

Other: No acute orbit or scalp soft tissue finding.
IMPRESSION: 1. Stable non contrast CT appearance of the brain. No acute or
evolving infarct identified.
2. Advanced chronic small vessel disease.
